# Patient Record
Sex: Female | Born: 1980
Health system: Southern US, Community
[De-identification: ages and names within clinical notes are randomized; demographics above are authoritative.]

## PROBLEM LIST (undated history)

## (undated) DIAGNOSIS — M87059 Idiopathic aseptic necrosis of unspecified femur: Secondary | ICD-10-CM

## (undated) DIAGNOSIS — I2510 Atherosclerotic heart disease of native coronary artery without angina pectoris: Secondary | ICD-10-CM

## (undated) DIAGNOSIS — IMO0002 Reserved for concepts with insufficient information to code with codable children: Secondary | ICD-10-CM

## (undated) DIAGNOSIS — M329 Systemic lupus erythematosus, unspecified: Secondary | ICD-10-CM

## (undated) DIAGNOSIS — R0602 Shortness of breath: Secondary | ICD-10-CM

## (undated) DIAGNOSIS — I10 Essential (primary) hypertension: Secondary | ICD-10-CM

## (undated) DIAGNOSIS — D649 Anemia, unspecified: Secondary | ICD-10-CM

## (undated) HISTORY — DX: Essential (primary) hypertension: I10

## (undated) HISTORY — DX: Atherosclerotic heart disease of native coronary artery without angina pectoris: I25.10

## (undated) HISTORY — DX: Reserved for concepts with insufficient information to code with codable children: IMO0002

## (undated) HISTORY — DX: Anemia, unspecified: D64.9

## (undated) HISTORY — DX: Idiopathic aseptic necrosis of unspecified femur: M87.059

## (undated) HISTORY — DX: Shortness of breath: R06.02

## (undated) HISTORY — DX: Systemic lupus erythematosus, unspecified: M32.9

---
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
## 2001-04-09 ENCOUNTER — Emergency Department (HOSPITAL_COMMUNITY): Admission: EM | Admit: 2001-04-09 | Discharge: 2001-04-09 | Payer: Self-pay | Admitting: Emergency Medicine

## 2001-05-01 ENCOUNTER — Emergency Department (HOSPITAL_COMMUNITY): Admission: EM | Admit: 2001-05-01 | Discharge: 2001-05-01 | Payer: Self-pay | Admitting: *Deleted

## 2001-05-02 ENCOUNTER — Emergency Department (HOSPITAL_COMMUNITY): Admission: EM | Admit: 2001-05-02 | Discharge: 2001-05-02 | Payer: Self-pay | Admitting: Emergency Medicine

## 2001-05-08 ENCOUNTER — Emergency Department (HOSPITAL_COMMUNITY): Admission: EM | Admit: 2001-05-08 | Discharge: 2001-05-08 | Payer: Self-pay | Admitting: Emergency Medicine

## 2001-05-23 ENCOUNTER — Encounter (INDEPENDENT_AMBULATORY_CARE_PROVIDER_SITE_OTHER): Payer: Self-pay | Admitting: Specialist

## 2001-05-23 ENCOUNTER — Inpatient Hospital Stay (HOSPITAL_COMMUNITY): Admission: EM | Admit: 2001-05-23 | Discharge: 2001-05-29 | Payer: Self-pay | Admitting: Family Medicine

## 2001-05-23 ENCOUNTER — Encounter: Payer: Self-pay | Admitting: Emergency Medicine

## 2001-05-23 ENCOUNTER — Emergency Department (HOSPITAL_COMMUNITY): Admission: EM | Admit: 2001-05-23 | Discharge: 2001-05-23 | Payer: Self-pay | Admitting: Emergency Medicine

## 2001-05-24 ENCOUNTER — Encounter: Payer: Self-pay | Admitting: Family Medicine

## 2001-05-25 ENCOUNTER — Encounter: Payer: Self-pay | Admitting: Family Medicine

## 2001-05-26 ENCOUNTER — Encounter: Payer: Self-pay | Admitting: Critical Care Medicine

## 2001-06-04 ENCOUNTER — Encounter: Admission: RE | Admit: 2001-06-04 | Discharge: 2001-06-04 | Payer: Self-pay | Admitting: Family Medicine

## 2001-06-22 ENCOUNTER — Encounter: Admission: RE | Admit: 2001-06-22 | Discharge: 2001-06-22 | Payer: Self-pay | Admitting: Family Medicine

## 2001-07-24 ENCOUNTER — Emergency Department (HOSPITAL_COMMUNITY): Admission: EM | Admit: 2001-07-24 | Discharge: 2001-07-24 | Payer: Self-pay | Admitting: Emergency Medicine

## 2001-08-19 ENCOUNTER — Encounter: Admission: RE | Admit: 2001-08-19 | Discharge: 2001-08-19 | Payer: Self-pay | Admitting: Family Medicine

## 2001-10-09 ENCOUNTER — Encounter: Payer: Self-pay | Admitting: Emergency Medicine

## 2001-10-09 ENCOUNTER — Emergency Department (HOSPITAL_COMMUNITY): Admission: EM | Admit: 2001-10-09 | Discharge: 2001-10-09 | Payer: Self-pay | Admitting: Emergency Medicine

## 2001-10-10 ENCOUNTER — Emergency Department (HOSPITAL_COMMUNITY): Admission: EM | Admit: 2001-10-10 | Discharge: 2001-10-10 | Payer: Self-pay | Admitting: Emergency Medicine

## 2001-10-13 ENCOUNTER — Emergency Department (HOSPITAL_COMMUNITY): Admission: EM | Admit: 2001-10-13 | Discharge: 2001-10-13 | Payer: Self-pay | Admitting: Emergency Medicine

## 2001-10-13 ENCOUNTER — Encounter: Payer: Self-pay | Admitting: Emergency Medicine

## 2001-10-16 ENCOUNTER — Encounter: Admission: RE | Admit: 2001-10-16 | Discharge: 2001-10-16 | Payer: Self-pay | Admitting: Family Medicine

## 2001-10-16 ENCOUNTER — Inpatient Hospital Stay (HOSPITAL_COMMUNITY): Admission: AD | Admit: 2001-10-16 | Discharge: 2001-10-19 | Payer: Self-pay | Admitting: Family Medicine

## 2001-10-16 ENCOUNTER — Encounter: Payer: Self-pay | Admitting: Family Medicine

## 2001-10-28 ENCOUNTER — Encounter: Admission: RE | Admit: 2001-10-28 | Discharge: 2001-10-28 | Payer: Self-pay | Admitting: Family Medicine

## 2001-12-01 ENCOUNTER — Encounter: Admission: RE | Admit: 2001-12-01 | Discharge: 2001-12-01 | Payer: Self-pay | Admitting: Family Medicine

## 2001-12-01 ENCOUNTER — Encounter: Payer: Self-pay | Admitting: Sports Medicine

## 2001-12-01 ENCOUNTER — Encounter: Admission: RE | Admit: 2001-12-01 | Discharge: 2001-12-01 | Payer: Self-pay | Admitting: Sports Medicine

## 2001-12-24 ENCOUNTER — Encounter: Admission: RE | Admit: 2001-12-24 | Discharge: 2001-12-24 | Payer: Self-pay | Admitting: Family Medicine

## 2002-01-18 ENCOUNTER — Ambulatory Visit (HOSPITAL_COMMUNITY): Admission: RE | Admit: 2002-01-18 | Discharge: 2002-01-18 | Payer: Self-pay | Admitting: Nephrology

## 2002-04-07 ENCOUNTER — Encounter: Admission: RE | Admit: 2002-04-07 | Discharge: 2002-04-07 | Payer: Self-pay | Admitting: Family Medicine

## 2002-07-08 ENCOUNTER — Emergency Department (HOSPITAL_COMMUNITY): Admission: EM | Admit: 2002-07-08 | Discharge: 2002-07-08 | Payer: Self-pay | Admitting: Emergency Medicine

## 2002-07-08 ENCOUNTER — Encounter: Payer: Self-pay | Admitting: Emergency Medicine

## 2002-07-13 ENCOUNTER — Encounter: Admission: RE | Admit: 2002-07-13 | Discharge: 2002-07-13 | Payer: Self-pay | Admitting: Family Medicine

## 2002-07-28 ENCOUNTER — Ambulatory Visit (HOSPITAL_COMMUNITY): Admission: RE | Admit: 2002-07-28 | Discharge: 2002-07-28 | Payer: Self-pay | Admitting: Family Medicine

## 2002-09-16 ENCOUNTER — Encounter: Admission: RE | Admit: 2002-09-16 | Discharge: 2002-09-16 | Payer: Self-pay | Admitting: Family Medicine

## 2002-10-28 ENCOUNTER — Emergency Department (HOSPITAL_COMMUNITY): Admission: EM | Admit: 2002-10-28 | Discharge: 2002-10-28 | Payer: Self-pay | Admitting: Emergency Medicine

## 2002-11-03 ENCOUNTER — Encounter: Admission: RE | Admit: 2002-11-03 | Discharge: 2002-11-03 | Payer: Self-pay | Admitting: Family Medicine

## 2002-11-30 ENCOUNTER — Encounter: Admission: RE | Admit: 2002-11-30 | Discharge: 2002-11-30 | Payer: Self-pay | Admitting: Sports Medicine

## 2003-01-11 ENCOUNTER — Emergency Department (HOSPITAL_COMMUNITY): Admission: EM | Admit: 2003-01-11 | Discharge: 2003-01-11 | Payer: Self-pay | Admitting: Emergency Medicine

## 2003-01-13 ENCOUNTER — Other Ambulatory Visit: Admission: RE | Admit: 2003-01-13 | Discharge: 2003-01-13 | Payer: Self-pay | Admitting: Family Medicine

## 2003-01-13 ENCOUNTER — Encounter (INDEPENDENT_AMBULATORY_CARE_PROVIDER_SITE_OTHER): Payer: Self-pay | Admitting: Specialist

## 2003-01-13 ENCOUNTER — Encounter: Admission: RE | Admit: 2003-01-13 | Discharge: 2003-01-13 | Payer: Self-pay | Admitting: Family Medicine

## 2003-02-14 ENCOUNTER — Encounter: Admission: RE | Admit: 2003-02-14 | Discharge: 2003-02-14 | Payer: Self-pay | Admitting: Sports Medicine

## 2003-03-22 ENCOUNTER — Emergency Department (HOSPITAL_COMMUNITY): Admission: EM | Admit: 2003-03-22 | Discharge: 2003-03-22 | Payer: Self-pay | Admitting: Emergency Medicine

## 2003-04-07 ENCOUNTER — Encounter: Admission: RE | Admit: 2003-04-07 | Discharge: 2003-04-07 | Payer: Self-pay | Admitting: Family Medicine

## 2003-04-08 ENCOUNTER — Encounter: Payer: Self-pay | Admitting: Family Medicine

## 2003-04-08 ENCOUNTER — Ambulatory Visit (HOSPITAL_COMMUNITY): Admission: RE | Admit: 2003-04-08 | Discharge: 2003-04-08 | Payer: Self-pay | Admitting: Family Medicine

## 2003-04-29 ENCOUNTER — Encounter: Admission: RE | Admit: 2003-04-29 | Discharge: 2003-04-29 | Payer: Self-pay | Admitting: Family Medicine

## 2003-05-11 ENCOUNTER — Emergency Department (HOSPITAL_COMMUNITY): Admission: EM | Admit: 2003-05-11 | Discharge: 2003-05-11 | Payer: Self-pay | Admitting: Emergency Medicine

## 2003-05-14 ENCOUNTER — Emergency Department (HOSPITAL_COMMUNITY): Admission: EM | Admit: 2003-05-14 | Discharge: 2003-05-14 | Payer: Self-pay | Admitting: Emergency Medicine

## 2003-05-26 ENCOUNTER — Emergency Department (HOSPITAL_COMMUNITY): Admission: EM | Admit: 2003-05-26 | Discharge: 2003-05-26 | Payer: Self-pay | Admitting: Emergency Medicine

## 2003-08-02 ENCOUNTER — Emergency Department (HOSPITAL_COMMUNITY): Admission: EM | Admit: 2003-08-02 | Discharge: 2003-08-02 | Payer: Self-pay | Admitting: Emergency Medicine

## 2003-08-25 ENCOUNTER — Encounter: Admission: RE | Admit: 2003-08-25 | Discharge: 2003-08-25 | Payer: Self-pay | Admitting: Family Medicine

## 2003-09-10 ENCOUNTER — Emergency Department (HOSPITAL_COMMUNITY): Admission: EM | Admit: 2003-09-10 | Discharge: 2003-09-10 | Payer: Self-pay

## 2003-10-31 ENCOUNTER — Encounter: Admission: RE | Admit: 2003-10-31 | Discharge: 2003-10-31 | Payer: Self-pay | Admitting: Sports Medicine

## 2003-12-20 ENCOUNTER — Encounter: Admission: RE | Admit: 2003-12-20 | Discharge: 2003-12-20 | Payer: Self-pay | Admitting: Family Medicine

## 2004-01-31 ENCOUNTER — Encounter: Admission: RE | Admit: 2004-01-31 | Discharge: 2004-01-31 | Payer: Self-pay | Admitting: Family Medicine

## 2004-02-11 ENCOUNTER — Emergency Department (HOSPITAL_COMMUNITY): Admission: EM | Admit: 2004-02-11 | Discharge: 2004-02-11 | Payer: Self-pay | Admitting: Emergency Medicine

## 2004-03-01 ENCOUNTER — Encounter (INDEPENDENT_AMBULATORY_CARE_PROVIDER_SITE_OTHER): Payer: Self-pay | Admitting: *Deleted

## 2004-03-01 ENCOUNTER — Other Ambulatory Visit: Admission: RE | Admit: 2004-03-01 | Discharge: 2004-03-01 | Payer: Self-pay | Admitting: Family Medicine

## 2004-03-01 ENCOUNTER — Ambulatory Visit: Payer: Self-pay | Admitting: Family Medicine

## 2004-03-28 ENCOUNTER — Ambulatory Visit: Payer: Self-pay | Admitting: Family Medicine

## 2004-05-08 ENCOUNTER — Ambulatory Visit: Payer: Self-pay | Admitting: Family Medicine

## 2004-06-07 ENCOUNTER — Ambulatory Visit: Payer: Self-pay | Admitting: Family Medicine

## 2004-06-12 ENCOUNTER — Ambulatory Visit: Payer: Self-pay | Admitting: Family Medicine

## 2004-07-03 ENCOUNTER — Ambulatory Visit: Payer: Self-pay | Admitting: Family Medicine

## 2004-07-24 ENCOUNTER — Ambulatory Visit: Payer: Self-pay | Admitting: Family Medicine

## 2004-09-09 ENCOUNTER — Emergency Department (HOSPITAL_COMMUNITY): Admission: EM | Admit: 2004-09-09 | Discharge: 2004-09-09 | Payer: Self-pay | Admitting: Emergency Medicine

## 2004-09-18 ENCOUNTER — Emergency Department (HOSPITAL_COMMUNITY): Admission: EM | Admit: 2004-09-18 | Discharge: 2004-09-18 | Payer: Self-pay | Admitting: *Deleted

## 2004-11-09 ENCOUNTER — Other Ambulatory Visit: Admission: RE | Admit: 2004-11-09 | Discharge: 2004-11-09 | Payer: Self-pay | Admitting: Family Medicine

## 2004-11-09 ENCOUNTER — Ambulatory Visit: Payer: Self-pay | Admitting: Family Medicine

## 2004-11-09 ENCOUNTER — Encounter (INDEPENDENT_AMBULATORY_CARE_PROVIDER_SITE_OTHER): Payer: Self-pay | Admitting: Specialist

## 2005-01-09 ENCOUNTER — Emergency Department (HOSPITAL_COMMUNITY): Admission: EM | Admit: 2005-01-09 | Discharge: 2005-01-09 | Payer: Self-pay | Admitting: Emergency Medicine

## 2005-01-22 ENCOUNTER — Ambulatory Visit: Payer: Self-pay | Admitting: Sports Medicine

## 2005-01-28 ENCOUNTER — Encounter: Admission: RE | Admit: 2005-01-28 | Discharge: 2005-01-28 | Payer: Self-pay | Admitting: Sports Medicine

## 2005-02-18 ENCOUNTER — Emergency Department (HOSPITAL_COMMUNITY): Admission: EM | Admit: 2005-02-18 | Discharge: 2005-02-18 | Payer: Self-pay | Admitting: Emergency Medicine

## 2005-02-21 ENCOUNTER — Ambulatory Visit: Payer: Self-pay | Admitting: Family Medicine

## 2005-03-12 ENCOUNTER — Ambulatory Visit: Payer: Self-pay | Admitting: Sports Medicine

## 2005-05-01 ENCOUNTER — Ambulatory Visit: Payer: Self-pay | Admitting: Family Medicine

## 2005-05-02 ENCOUNTER — Encounter: Admission: RE | Admit: 2005-05-02 | Discharge: 2005-05-02 | Payer: Self-pay | Admitting: Sports Medicine

## 2005-05-20 ENCOUNTER — Ambulatory Visit: Payer: Self-pay | Admitting: Family Medicine

## 2005-05-21 ENCOUNTER — Encounter: Admission: RE | Admit: 2005-05-21 | Discharge: 2005-05-21 | Payer: Self-pay | Admitting: Vascular Surgery

## 2005-06-20 ENCOUNTER — Ambulatory Visit: Payer: Self-pay | Admitting: Sports Medicine

## 2005-07-24 ENCOUNTER — Ambulatory Visit: Payer: Self-pay | Admitting: Family Medicine

## 2005-08-22 ENCOUNTER — Ambulatory Visit: Payer: Self-pay | Admitting: Family Medicine

## 2005-09-10 ENCOUNTER — Emergency Department (HOSPITAL_COMMUNITY): Admission: EM | Admit: 2005-09-10 | Discharge: 2005-09-10 | Payer: Self-pay | Admitting: Emergency Medicine

## 2005-10-31 ENCOUNTER — Emergency Department (HOSPITAL_COMMUNITY): Admission: EM | Admit: 2005-10-31 | Discharge: 2005-10-31 | Payer: Self-pay | Admitting: Emergency Medicine

## 2005-11-18 ENCOUNTER — Emergency Department (HOSPITAL_COMMUNITY): Admission: EM | Admit: 2005-11-18 | Discharge: 2005-11-18 | Payer: Self-pay | Admitting: Emergency Medicine

## 2005-12-02 ENCOUNTER — Ambulatory Visit: Payer: Self-pay | Admitting: Sports Medicine

## 2005-12-24 ENCOUNTER — Ambulatory Visit: Payer: Self-pay | Admitting: Sports Medicine

## 2006-01-16 ENCOUNTER — Ambulatory Visit: Payer: Self-pay | Admitting: Sports Medicine

## 2006-01-28 ENCOUNTER — Encounter (INDEPENDENT_AMBULATORY_CARE_PROVIDER_SITE_OTHER): Payer: Self-pay | Admitting: *Deleted

## 2006-01-28 LAB — CONVERTED CEMR LAB

## 2006-02-18 ENCOUNTER — Ambulatory Visit: Payer: Self-pay | Admitting: Sports Medicine

## 2006-02-18 ENCOUNTER — Other Ambulatory Visit: Admission: RE | Admit: 2006-02-18 | Discharge: 2006-02-18 | Payer: Self-pay | Admitting: Family Medicine

## 2006-02-18 ENCOUNTER — Encounter (INDEPENDENT_AMBULATORY_CARE_PROVIDER_SITE_OTHER): Payer: Self-pay | Admitting: *Deleted

## 2006-03-11 ENCOUNTER — Ambulatory Visit: Payer: Self-pay | Admitting: Sports Medicine

## 2006-03-17 ENCOUNTER — Ambulatory Visit: Payer: Self-pay | Admitting: Sports Medicine

## 2006-03-20 ENCOUNTER — Ambulatory Visit: Payer: Self-pay | Admitting: Sports Medicine

## 2006-04-14 ENCOUNTER — Ambulatory Visit: Payer: Self-pay | Admitting: Family Medicine

## 2006-04-22 ENCOUNTER — Ambulatory Visit: Payer: Self-pay | Admitting: Family Medicine

## 2006-05-13 ENCOUNTER — Ambulatory Visit: Payer: Self-pay | Admitting: Family Medicine

## 2006-06-05 ENCOUNTER — Ambulatory Visit: Payer: Self-pay | Admitting: Family Medicine

## 2006-06-25 ENCOUNTER — Ambulatory Visit: Payer: Self-pay | Admitting: Family Medicine

## 2006-06-25 ENCOUNTER — Encounter: Payer: Self-pay | Admitting: Family Medicine

## 2006-06-25 LAB — CONVERTED CEMR LAB
Prolactin: 21.8 ng/mL
TSH: 0.451 microintl units/mL (ref 0.350–5.50)

## 2006-07-06 ENCOUNTER — Emergency Department (HOSPITAL_COMMUNITY): Admission: EM | Admit: 2006-07-06 | Discharge: 2006-07-06 | Payer: Self-pay | Admitting: Emergency Medicine

## 2006-07-22 ENCOUNTER — Encounter (INDEPENDENT_AMBULATORY_CARE_PROVIDER_SITE_OTHER): Payer: Self-pay | Admitting: Family Medicine

## 2006-07-22 ENCOUNTER — Ambulatory Visit: Payer: Self-pay | Admitting: Family Medicine

## 2006-07-22 LAB — CONVERTED CEMR LAB
Free T4: 1.06 ng/dL (ref 0.89–1.80)
T3, Free: 3.4 pg/mL (ref 2.3–4.2)
TSH: 0.619 microintl units/mL (ref 0.350–5.50)

## 2006-07-23 ENCOUNTER — Encounter (INDEPENDENT_AMBULATORY_CARE_PROVIDER_SITE_OTHER): Payer: Self-pay | Admitting: Family Medicine

## 2006-07-23 LAB — CONVERTED CEMR LAB: Prolactin: 19.2 ng/mL

## 2006-07-24 ENCOUNTER — Encounter (INDEPENDENT_AMBULATORY_CARE_PROVIDER_SITE_OTHER): Payer: Self-pay | Admitting: Family Medicine

## 2006-07-24 LAB — CONVERTED CEMR LAB
ALT: 9 units/L (ref 0–35)
AST: 14 units/L (ref 0–37)
Albumin: 3.3 g/dL — ABNORMAL LOW (ref 3.5–5.2)
Alkaline Phosphatase: 42 units/L (ref 39–117)
BUN: 12 mg/dL (ref 6–23)
CO2: 24 meq/L (ref 19–32)
Calcium: 7.7 mg/dL — ABNORMAL LOW (ref 8.4–10.5)
Chloride: 110 meq/L (ref 96–112)
Collection Interval-CRCL: 24 hr
Creatinine 24 HR UR: 2091 mg/24hr — ABNORMAL HIGH (ref 700–1800)
Creatinine Clearance: 167 mL/min — ABNORMAL HIGH (ref 75–115)
Creatinine, Ser: 0.87 mg/dL (ref 0.40–1.20)
Creatinine, Urine: 139.4 mg/dL
Glucose, Bld: 79 mg/dL (ref 70–99)
LDH: 171 units/L (ref 94–250)
Potassium: 3.8 meq/L (ref 3.5–5.3)
Protein, Ur: 90 mg/24hr (ref 50–100)
Sodium: 141 meq/L (ref 135–145)
Total Bilirubin: 0.3 mg/dL (ref 0.3–1.2)
Total Protein: 5.9 g/dL — ABNORMAL LOW (ref 6.0–8.3)
Uric Acid, Serum: 6.1 mg/dL (ref 2.4–7.0)

## 2006-08-21 DIAGNOSIS — I10 Essential (primary) hypertension: Secondary | ICD-10-CM | POA: Insufficient documentation

## 2006-08-21 DIAGNOSIS — D649 Anemia, unspecified: Secondary | ICD-10-CM | POA: Insufficient documentation

## 2006-08-21 DIAGNOSIS — M329 Systemic lupus erythematosus, unspecified: Secondary | ICD-10-CM | POA: Insufficient documentation

## 2006-08-21 DIAGNOSIS — R809 Proteinuria, unspecified: Secondary | ICD-10-CM | POA: Insufficient documentation

## 2006-08-22 ENCOUNTER — Encounter (INDEPENDENT_AMBULATORY_CARE_PROVIDER_SITE_OTHER): Payer: Self-pay | Admitting: *Deleted

## 2006-08-27 ENCOUNTER — Telehealth (INDEPENDENT_AMBULATORY_CARE_PROVIDER_SITE_OTHER): Payer: Self-pay | Admitting: Family Medicine

## 2006-08-28 ENCOUNTER — Ambulatory Visit: Payer: Self-pay | Admitting: Family Medicine

## 2006-08-28 DIAGNOSIS — M87051 Idiopathic aseptic necrosis of right femur: Secondary | ICD-10-CM | POA: Insufficient documentation

## 2006-09-08 ENCOUNTER — Telehealth (INDEPENDENT_AMBULATORY_CARE_PROVIDER_SITE_OTHER): Payer: Self-pay | Admitting: Family Medicine

## 2006-09-10 ENCOUNTER — Telehealth: Payer: Self-pay | Admitting: *Deleted

## 2006-09-10 ENCOUNTER — Telehealth (INDEPENDENT_AMBULATORY_CARE_PROVIDER_SITE_OTHER): Payer: Self-pay | Admitting: *Deleted

## 2006-09-11 ENCOUNTER — Emergency Department (HOSPITAL_COMMUNITY): Admission: EM | Admit: 2006-09-11 | Discharge: 2006-09-11 | Payer: Self-pay | Admitting: Emergency Medicine

## 2006-09-11 ENCOUNTER — Telehealth: Payer: Self-pay | Admitting: *Deleted

## 2006-09-23 ENCOUNTER — Telehealth: Payer: Self-pay | Admitting: *Deleted

## 2006-10-01 ENCOUNTER — Telehealth (INDEPENDENT_AMBULATORY_CARE_PROVIDER_SITE_OTHER): Payer: Self-pay | Admitting: *Deleted

## 2006-10-03 ENCOUNTER — Ambulatory Visit: Payer: Self-pay | Admitting: Family Medicine

## 2006-11-04 ENCOUNTER — Encounter: Payer: Self-pay | Admitting: Family Medicine

## 2006-11-04 ENCOUNTER — Ambulatory Visit: Payer: Self-pay | Admitting: Family Medicine

## 2006-11-04 ENCOUNTER — Encounter (INDEPENDENT_AMBULATORY_CARE_PROVIDER_SITE_OTHER): Payer: Self-pay | Admitting: Family Medicine

## 2006-11-04 ENCOUNTER — Other Ambulatory Visit: Admission: RE | Admit: 2006-11-04 | Discharge: 2006-11-04 | Payer: Self-pay | Admitting: Family Medicine

## 2006-11-04 LAB — CONVERTED CEMR LAB
BUN: 10 mg/dL (ref 6–23)
CO2: 25 meq/L (ref 19–32)
Calcium: 8.8 mg/dL (ref 8.4–10.5)
Chloride: 106 meq/L (ref 96–112)
Creatinine, Ser: 0.99 mg/dL (ref 0.40–1.20)
Glucose, Bld: 96 mg/dL (ref 70–99)
Potassium: 3.7 meq/L (ref 3.5–5.3)
Sodium: 141 meq/L (ref 135–145)

## 2006-11-27 ENCOUNTER — Telehealth: Payer: Self-pay | Admitting: *Deleted

## 2006-12-11 ENCOUNTER — Ambulatory Visit: Payer: Self-pay | Admitting: Sports Medicine

## 2006-12-11 ENCOUNTER — Encounter (INDEPENDENT_AMBULATORY_CARE_PROVIDER_SITE_OTHER): Payer: Self-pay | Admitting: Family Medicine

## 2006-12-11 LAB — CONVERTED CEMR LAB
Basophils Absolute: <0.2 10*3/uL
Eosinophils Absolute: <0.7 10*3/uL
Ferritin: 24 ng/mL (ref 10–291)
Granulocyte count absolute: 4.8 10*3/uL
Granulocyte percent: 68.6 %
HCT: 31.4 %
Hemoglobin: 10.6 g/dL
Lymphocytes Relative: 21.6 %
Lymphs Abs: 1.5 10*3/uL
MCV: 89.1 fL
Monocytes Absolute: 0.7 10*3/uL
Monocytes Relative: 9.8 %
Platelets: 345 10*3/uL
RBC: 3.53 M/uL
WBC: 7 10*3/uL

## 2007-01-05 ENCOUNTER — Telehealth (INDEPENDENT_AMBULATORY_CARE_PROVIDER_SITE_OTHER): Payer: Self-pay | Admitting: Family Medicine

## 2007-01-08 ENCOUNTER — Telehealth (INDEPENDENT_AMBULATORY_CARE_PROVIDER_SITE_OTHER): Payer: Self-pay | Admitting: Family Medicine

## 2007-01-09 ENCOUNTER — Telehealth (INDEPENDENT_AMBULATORY_CARE_PROVIDER_SITE_OTHER): Payer: Self-pay | Admitting: Family Medicine

## 2007-02-02 ENCOUNTER — Telehealth: Payer: Self-pay | Admitting: *Deleted

## 2007-02-09 ENCOUNTER — Telehealth: Payer: Self-pay | Admitting: *Deleted

## 2007-02-09 ENCOUNTER — Ambulatory Visit: Payer: Self-pay | Admitting: Family Medicine

## 2007-02-09 LAB — CONVERTED CEMR LAB: Beta hcg, urine, semiquantitative: NEGATIVE

## 2007-02-10 ENCOUNTER — Telehealth: Payer: Self-pay | Admitting: *Deleted

## 2007-02-18 ENCOUNTER — Encounter (INDEPENDENT_AMBULATORY_CARE_PROVIDER_SITE_OTHER): Payer: Self-pay | Admitting: *Deleted

## 2007-02-19 ENCOUNTER — Telehealth (INDEPENDENT_AMBULATORY_CARE_PROVIDER_SITE_OTHER): Payer: Self-pay | Admitting: Family Medicine

## 2007-03-05 ENCOUNTER — Ambulatory Visit: Payer: Self-pay | Admitting: Family Medicine

## 2007-03-05 ENCOUNTER — Encounter (INDEPENDENT_AMBULATORY_CARE_PROVIDER_SITE_OTHER): Payer: Self-pay | Admitting: Family Medicine

## 2007-03-05 LAB — CONVERTED CEMR LAB
BUN: 15 mg/dL (ref 6–23)
CO2: 24 meq/L (ref 19–32)
Calcium: 9 mg/dL (ref 8.4–10.5)
Chloride: 109 meq/L (ref 96–112)
Creatinine, Ser: 1.04 mg/dL (ref 0.40–1.20)
Glucose, Bld: 85 mg/dL (ref 70–99)
Potassium: 3.7 meq/L (ref 3.5–5.3)
Sodium: 142 meq/L (ref 135–145)

## 2007-03-08 ENCOUNTER — Encounter (INDEPENDENT_AMBULATORY_CARE_PROVIDER_SITE_OTHER): Payer: Self-pay | Admitting: Family Medicine

## 2007-04-06 ENCOUNTER — Ambulatory Visit: Payer: Self-pay | Admitting: Family Medicine

## 2007-04-06 ENCOUNTER — Encounter (INDEPENDENT_AMBULATORY_CARE_PROVIDER_SITE_OTHER): Payer: Self-pay | Admitting: Family Medicine

## 2007-04-06 LAB — CONVERTED CEMR LAB
ALT: <8 units/L (ref 0–35)
AST: 13 units/L (ref 0–37)
Albumin: 3.8 g/dL (ref 3.5–5.2)
Alkaline Phosphatase: 42 units/L (ref 39–117)
BUN: 13 mg/dL (ref 6–23)
CO2: 23 meq/L (ref 19–32)
Calcium: 8.4 mg/dL (ref 8.4–10.5)
Chloride: 107 meq/L (ref 96–112)
Creatinine, Ser: 0.93 mg/dL (ref 0.40–1.20)
Glucose, Bld: 87 mg/dL (ref 70–99)
HCT: 34.6 % — ABNORMAL LOW (ref 36.0–46.0)
Hemoglobin: 11.5 g/dL — ABNORMAL LOW (ref 12.0–15.0)
MCHC: 33.2 g/dL (ref 30.0–36.0)
MCV: 90.1 fL (ref 78.0–100.0)
Platelets: 327 10*3/uL (ref 150–400)
Potassium: 3.6 meq/L (ref 3.5–5.3)
RBC: 3.84 M/uL — ABNORMAL LOW (ref 3.87–5.11)
RDW: 13.8 % (ref 11.5–14.0)
Sodium: 141 meq/L (ref 135–145)
Total Bilirubin: 0.3 mg/dL (ref 0.3–1.2)
Total Protein: 6.7 g/dL (ref 6.0–8.3)
WBC: 6.1 10*3/uL (ref 4.0–10.5)

## 2007-04-07 ENCOUNTER — Encounter (INDEPENDENT_AMBULATORY_CARE_PROVIDER_SITE_OTHER): Payer: Self-pay | Admitting: Family Medicine

## 2007-04-28 ENCOUNTER — Ambulatory Visit: Payer: Self-pay | Admitting: Family Medicine

## 2007-05-07 ENCOUNTER — Ambulatory Visit: Payer: Self-pay | Admitting: Family Medicine

## 2007-05-08 ENCOUNTER — Telehealth: Payer: Self-pay | Admitting: *Deleted

## 2007-05-13 ENCOUNTER — Telehealth: Payer: Self-pay | Admitting: *Deleted

## 2007-05-14 ENCOUNTER — Telehealth (INDEPENDENT_AMBULATORY_CARE_PROVIDER_SITE_OTHER): Payer: Self-pay | Admitting: Family Medicine

## 2007-05-26 ENCOUNTER — Encounter (INDEPENDENT_AMBULATORY_CARE_PROVIDER_SITE_OTHER): Payer: Self-pay | Admitting: Family Medicine

## 2007-06-02 ENCOUNTER — Ambulatory Visit: Payer: Self-pay | Admitting: Family Medicine

## 2007-06-02 ENCOUNTER — Encounter (INDEPENDENT_AMBULATORY_CARE_PROVIDER_SITE_OTHER): Payer: Self-pay | Admitting: Family Medicine

## 2007-06-03 LAB — CONVERTED CEMR LAB

## 2007-06-29 ENCOUNTER — Other Ambulatory Visit: Admission: RE | Admit: 2007-06-29 | Discharge: 2007-06-29 | Payer: Self-pay | Admitting: Family Medicine

## 2007-06-29 ENCOUNTER — Ambulatory Visit: Payer: Self-pay | Admitting: Family Medicine

## 2007-06-29 ENCOUNTER — Encounter (INDEPENDENT_AMBULATORY_CARE_PROVIDER_SITE_OTHER): Payer: Self-pay | Admitting: Family Medicine

## 2007-06-29 ENCOUNTER — Telehealth: Payer: Self-pay | Admitting: *Deleted

## 2007-06-29 LAB — CONVERTED CEMR LAB: Beta hcg, urine, semiquantitative: NEGATIVE

## 2007-06-30 ENCOUNTER — Encounter: Admission: RE | Admit: 2007-06-30 | Discharge: 2007-06-30 | Payer: Self-pay | Admitting: Family Medicine

## 2007-07-03 LAB — CONVERTED CEMR LAB

## 2007-07-07 ENCOUNTER — Telehealth (INDEPENDENT_AMBULATORY_CARE_PROVIDER_SITE_OTHER): Payer: Self-pay | Admitting: Family Medicine

## 2007-07-13 ENCOUNTER — Telehealth (INDEPENDENT_AMBULATORY_CARE_PROVIDER_SITE_OTHER): Payer: Self-pay | Admitting: Family Medicine

## 2007-07-14 ENCOUNTER — Inpatient Hospital Stay (HOSPITAL_COMMUNITY): Admission: AD | Admit: 2007-07-14 | Discharge: 2007-07-14 | Payer: Self-pay | Admitting: Obstetrics & Gynecology

## 2007-07-28 ENCOUNTER — Telehealth: Payer: Self-pay | Admitting: *Deleted

## 2007-08-10 ENCOUNTER — Ambulatory Visit: Payer: Self-pay | Admitting: Family Medicine

## 2007-08-10 LAB — CONVERTED CEMR LAB: Beta hcg, urine, semiquantitative: POSITIVE

## 2007-08-11 ENCOUNTER — Telehealth (INDEPENDENT_AMBULATORY_CARE_PROVIDER_SITE_OTHER): Payer: Self-pay | Admitting: Family Medicine

## 2007-08-12 ENCOUNTER — Inpatient Hospital Stay (HOSPITAL_COMMUNITY): Admission: AD | Admit: 2007-08-12 | Discharge: 2007-08-13 | Payer: Self-pay | Admitting: Obstetrics & Gynecology

## 2007-08-13 ENCOUNTER — Encounter (INDEPENDENT_AMBULATORY_CARE_PROVIDER_SITE_OTHER): Payer: Self-pay | Admitting: *Deleted

## 2007-08-15 ENCOUNTER — Inpatient Hospital Stay (HOSPITAL_COMMUNITY): Admission: AD | Admit: 2007-08-15 | Discharge: 2007-08-15 | Payer: Self-pay | Admitting: Obstetrics & Gynecology

## 2007-08-20 ENCOUNTER — Ambulatory Visit: Payer: Self-pay | Admitting: Obstetrics & Gynecology

## 2007-08-21 ENCOUNTER — Ambulatory Visit (HOSPITAL_COMMUNITY): Admission: RE | Admit: 2007-08-21 | Discharge: 2007-08-21 | Payer: Self-pay | Admitting: Obstetrics & Gynecology

## 2007-08-27 ENCOUNTER — Ambulatory Visit: Payer: Self-pay | Admitting: Family Medicine

## 2007-09-03 ENCOUNTER — Ambulatory Visit (HOSPITAL_COMMUNITY): Admission: RE | Admit: 2007-09-03 | Discharge: 2007-09-03 | Payer: Self-pay | Admitting: Family Medicine

## 2007-09-08 ENCOUNTER — Telehealth: Payer: Self-pay | Admitting: *Deleted

## 2007-09-10 ENCOUNTER — Other Ambulatory Visit: Payer: Self-pay | Admitting: Obstetrics and Gynecology

## 2007-09-10 ENCOUNTER — Inpatient Hospital Stay (HOSPITAL_COMMUNITY): Admission: RE | Admit: 2007-09-10 | Discharge: 2007-09-10 | Payer: Self-pay | Admitting: Obstetrics and Gynecology

## 2007-09-11 ENCOUNTER — Ambulatory Visit: Payer: Self-pay | Admitting: Obstetrics and Gynecology

## 2007-09-11 ENCOUNTER — Observation Stay (HOSPITAL_COMMUNITY): Admission: AD | Admit: 2007-09-11 | Discharge: 2007-09-11 | Payer: Self-pay | Admitting: Gynecology

## 2007-09-11 ENCOUNTER — Encounter: Payer: Self-pay | Admitting: Obstetrics and Gynecology

## 2007-09-23 ENCOUNTER — Ambulatory Visit: Payer: Self-pay | Admitting: Family Medicine

## 2007-09-25 ENCOUNTER — Ambulatory Visit: Payer: Self-pay | Admitting: Obstetrics & Gynecology

## 2007-09-25 ENCOUNTER — Encounter (INDEPENDENT_AMBULATORY_CARE_PROVIDER_SITE_OTHER): Payer: Self-pay | Admitting: Family Medicine

## 2007-09-25 ENCOUNTER — Ambulatory Visit: Payer: Self-pay | Admitting: Family Medicine

## 2007-09-25 LAB — CONVERTED CEMR LAB
BUN: 12 mg/dL (ref 6–23)
CO2: 23 meq/L (ref 19–32)
Calcium: 8.4 mg/dL (ref 8.4–10.5)
Chloride: 104 meq/L (ref 96–112)
Cholesterol: 172 mg/dL (ref 0–200)
Creatinine, Ser: 1.01 mg/dL (ref 0.40–1.20)
Glucose, Bld: 86 mg/dL (ref 70–99)
HDL: 49 mg/dL (ref >39)
LDL Cholesterol: 107 mg/dL — ABNORMAL HIGH (ref 0–99)
Potassium: 3.5 meq/L (ref 3.5–5.3)
Sodium: 139 meq/L (ref 135–145)
Total CHOL/HDL Ratio: 3.5
Triglycerides: 79 mg/dL (ref <150)
VLDL: 16 mg/dL (ref 0–40)

## 2007-09-28 ENCOUNTER — Encounter (INDEPENDENT_AMBULATORY_CARE_PROVIDER_SITE_OTHER): Payer: Self-pay | Admitting: Family Medicine

## 2007-10-09 ENCOUNTER — Telehealth (INDEPENDENT_AMBULATORY_CARE_PROVIDER_SITE_OTHER): Payer: Self-pay | Admitting: Family Medicine

## 2007-11-06 ENCOUNTER — Ambulatory Visit: Payer: Self-pay | Admitting: Family Medicine

## 2007-11-06 ENCOUNTER — Telehealth: Payer: Self-pay | Admitting: *Deleted

## 2007-11-06 ENCOUNTER — Encounter: Payer: Self-pay | Admitting: Family Medicine

## 2007-11-09 ENCOUNTER — Telehealth (INDEPENDENT_AMBULATORY_CARE_PROVIDER_SITE_OTHER): Payer: Self-pay | Admitting: Family Medicine

## 2007-12-09 ENCOUNTER — Telehealth (INDEPENDENT_AMBULATORY_CARE_PROVIDER_SITE_OTHER): Payer: Self-pay | Admitting: *Deleted

## 2007-12-17 ENCOUNTER — Encounter (INDEPENDENT_AMBULATORY_CARE_PROVIDER_SITE_OTHER): Payer: Self-pay | Admitting: Family Medicine

## 2007-12-28 ENCOUNTER — Encounter: Admission: RE | Admit: 2007-12-28 | Discharge: 2007-12-28 | Payer: Self-pay | Admitting: Family Medicine

## 2008-01-07 ENCOUNTER — Telehealth: Payer: Self-pay | Admitting: *Deleted

## 2008-01-28 ENCOUNTER — Ambulatory Visit: Payer: Self-pay | Admitting: Family Medicine

## 2008-01-28 ENCOUNTER — Encounter (INDEPENDENT_AMBULATORY_CARE_PROVIDER_SITE_OTHER): Payer: Self-pay | Admitting: Family Medicine

## 2008-01-28 LAB — CONVERTED CEMR LAB
BUN: 18 mg/dL (ref 6–23)
CO2: 21 meq/L (ref 19–32)
Calcium: 8.8 mg/dL (ref 8.4–10.5)
Chloride: 107 meq/L (ref 96–112)
Creatinine, Ser: 1.04 mg/dL (ref 0.40–1.20)
Glucose, Bld: 96 mg/dL (ref 70–99)
Potassium: 3.7 meq/L (ref 3.5–5.3)
Sodium: 139 meq/L (ref 135–145)

## 2008-02-05 ENCOUNTER — Telehealth (INDEPENDENT_AMBULATORY_CARE_PROVIDER_SITE_OTHER): Payer: Self-pay | Admitting: Family Medicine

## 2008-02-29 ENCOUNTER — Inpatient Hospital Stay (HOSPITAL_COMMUNITY): Admission: AD | Admit: 2008-02-29 | Discharge: 2008-02-29 | Payer: Self-pay | Admitting: Obstetrics & Gynecology

## 2008-03-01 ENCOUNTER — Other Ambulatory Visit: Admission: RE | Admit: 2008-03-01 | Discharge: 2008-03-01 | Payer: Self-pay | Admitting: Family Medicine

## 2008-03-01 ENCOUNTER — Encounter (INDEPENDENT_AMBULATORY_CARE_PROVIDER_SITE_OTHER): Payer: Self-pay | Admitting: Family Medicine

## 2008-03-01 ENCOUNTER — Ambulatory Visit: Payer: Self-pay | Admitting: Family Medicine

## 2008-03-01 LAB — CONVERTED CEMR LAB
Antibody Screen: NEGATIVE
Basophils Absolute: 0 10*3/uL (ref 0.0–0.1)
Basophils Relative: 0 % (ref 0–1)
Beta hcg, urine, semiquantitative: POSITIVE
Chlamydia, DNA Probe: NEGATIVE
Eosinophils Absolute: 0 10*3/uL (ref 0.0–0.7)
Eosinophils Relative: 0 % (ref 0–5)
GC Probe Amp, Genital: NEGATIVE
HCT: 31.7 % — ABNORMAL LOW (ref 36.0–46.0)
Hemoglobin: 10.4 g/dL — ABNORMAL LOW (ref 12.0–15.0)
Hepatitis B Surface Ag: NEGATIVE
Lymphocytes Relative: 8 % — ABNORMAL LOW (ref 12–46)
Lymphs Abs: 0.8 10*3/uL (ref 0.7–4.0)
MCHC: 32.8 g/dL (ref 30.0–36.0)
MCV: 86.8 fL (ref 78.0–100.0)
Monocytes Absolute: 0.5 10*3/uL (ref 0.1–1.0)
Monocytes Relative: 5 % (ref 3–12)
Neutro Abs: 8.7 10*3/uL — ABNORMAL HIGH (ref 1.7–7.7)
Neutrophils Relative %: 86 % — ABNORMAL HIGH (ref 43–77)
Platelets: 358 10*3/uL (ref 150–400)
RBC: 3.65 M/uL — ABNORMAL LOW (ref 3.87–5.11)
RDW: 13.5 % (ref 11.5–15.5)
Rh Type: POSITIVE
Rubella: 231.6 intl units/mL — ABNORMAL HIGH
Sickle Cell Screen: NEGATIVE
WBC: 10.1 10*3/uL (ref 4.0–10.5)

## 2008-03-02 ENCOUNTER — Encounter (INDEPENDENT_AMBULATORY_CARE_PROVIDER_SITE_OTHER): Payer: Self-pay | Admitting: *Deleted

## 2008-03-07 ENCOUNTER — Telehealth (INDEPENDENT_AMBULATORY_CARE_PROVIDER_SITE_OTHER): Payer: Self-pay | Admitting: Family Medicine

## 2008-03-09 ENCOUNTER — Ambulatory Visit: Payer: Self-pay | Admitting: Family Medicine

## 2008-03-10 ENCOUNTER — Ambulatory Visit (HOSPITAL_COMMUNITY): Admission: RE | Admit: 2008-03-10 | Discharge: 2008-03-10 | Payer: Self-pay | Admitting: Family Medicine

## 2008-03-10 ENCOUNTER — Encounter (INDEPENDENT_AMBULATORY_CARE_PROVIDER_SITE_OTHER): Payer: Self-pay | Admitting: Family Medicine

## 2008-03-11 ENCOUNTER — Encounter: Payer: Self-pay | Admitting: *Deleted

## 2008-03-16 ENCOUNTER — Inpatient Hospital Stay (HOSPITAL_COMMUNITY): Admission: AD | Admit: 2008-03-16 | Discharge: 2008-03-16 | Payer: Self-pay | Admitting: Obstetrics & Gynecology

## 2008-03-24 ENCOUNTER — Ambulatory Visit: Payer: Self-pay | Admitting: Obstetrics & Gynecology

## 2008-04-06 ENCOUNTER — Telehealth (INDEPENDENT_AMBULATORY_CARE_PROVIDER_SITE_OTHER): Payer: Self-pay | Admitting: Family Medicine

## 2008-04-07 ENCOUNTER — Encounter (INDEPENDENT_AMBULATORY_CARE_PROVIDER_SITE_OTHER): Payer: Self-pay | Admitting: Family Medicine

## 2008-04-07 ENCOUNTER — Ambulatory Visit: Payer: Self-pay | Admitting: Obstetrics & Gynecology

## 2008-04-07 LAB — CONVERTED CEMR LAB
ALT: 13 units/L (ref 0–35)
AST: 16 units/L (ref 0–37)
Albumin: 3.4 g/dL — ABNORMAL LOW (ref 3.5–5.2)
Alkaline Phosphatase: 41 units/L (ref 39–117)
BUN: 10 mg/dL (ref 6–23)
CO2: 25 meq/L (ref 19–32)
Calcium: 9.6 mg/dL (ref 8.4–10.5)
Chloride: 103 meq/L (ref 96–112)
Creatinine, Ser: 0.86 mg/dL (ref 0.40–1.20)
Glucose, Bld: 85 mg/dL (ref 70–99)
Potassium: 3.7 meq/L (ref 3.5–5.3)
Sodium: 135 meq/L (ref 135–145)
Total Bilirubin: 0.3 mg/dL (ref 0.3–1.2)
Total Protein: 6.2 g/dL (ref 6.0–8.3)
Uric Acid, Serum: 5 mg/dL (ref 2.4–7.0)

## 2008-04-11 ENCOUNTER — Encounter: Payer: Self-pay | Admitting: Obstetrics & Gynecology

## 2008-04-11 ENCOUNTER — Ambulatory Visit: Payer: Self-pay | Admitting: Obstetrics & Gynecology

## 2008-04-11 LAB — CONVERTED CEMR LAB
Collection Interval-CRCL: 24 hr
Creatinine 24 HR UR: 2088 mg/24hr — ABNORMAL HIGH (ref 700–1800)
Creatinine Clearance: 169 mL/min — ABNORMAL HIGH (ref 75–115)
Creatinine, Urine: 185.6 mg/dL
Protein, Ur: 158 mg/24hr — ABNORMAL HIGH (ref 50–100)

## 2008-04-13 ENCOUNTER — Ambulatory Visit (HOSPITAL_COMMUNITY): Admission: RE | Admit: 2008-04-13 | Discharge: 2008-04-13 | Payer: Self-pay | Admitting: Obstetrics & Gynecology

## 2008-04-18 ENCOUNTER — Telehealth (INDEPENDENT_AMBULATORY_CARE_PROVIDER_SITE_OTHER): Payer: Self-pay | Admitting: Family Medicine

## 2008-04-20 ENCOUNTER — Inpatient Hospital Stay (HOSPITAL_COMMUNITY): Admission: AD | Admit: 2008-04-20 | Discharge: 2008-04-20 | Payer: Self-pay | Admitting: Obstetrics & Gynecology

## 2008-04-21 ENCOUNTER — Ambulatory Visit: Payer: Self-pay | Admitting: Obstetrics & Gynecology

## 2008-05-05 ENCOUNTER — Telehealth (INDEPENDENT_AMBULATORY_CARE_PROVIDER_SITE_OTHER): Payer: Self-pay | Admitting: *Deleted

## 2008-05-05 ENCOUNTER — Ambulatory Visit: Payer: Self-pay | Admitting: Family Medicine

## 2008-05-05 ENCOUNTER — Encounter: Payer: Self-pay | Admitting: Obstetrics & Gynecology

## 2008-05-11 ENCOUNTER — Ambulatory Visit (HOSPITAL_COMMUNITY): Admission: RE | Admit: 2008-05-11 | Discharge: 2008-05-11 | Payer: Self-pay | Admitting: Obstetrics & Gynecology

## 2008-05-12 ENCOUNTER — Ambulatory Visit: Payer: Self-pay | Admitting: Obstetrics & Gynecology

## 2008-05-13 ENCOUNTER — Encounter: Payer: Self-pay | Admitting: Obstetrics & Gynecology

## 2008-05-19 ENCOUNTER — Inpatient Hospital Stay (HOSPITAL_COMMUNITY): Admission: AD | Admit: 2008-05-19 | Discharge: 2008-05-19 | Payer: Self-pay | Admitting: Obstetrics & Gynecology

## 2008-05-25 ENCOUNTER — Ambulatory Visit (HOSPITAL_COMMUNITY): Admission: RE | Admit: 2008-05-25 | Discharge: 2008-05-25 | Payer: Self-pay | Admitting: Obstetrics and Gynecology

## 2008-05-30 ENCOUNTER — Telehealth (INDEPENDENT_AMBULATORY_CARE_PROVIDER_SITE_OTHER): Payer: Self-pay | Admitting: Family Medicine

## 2008-06-02 ENCOUNTER — Ambulatory Visit: Payer: Self-pay | Admitting: Obstetrics & Gynecology

## 2008-06-02 ENCOUNTER — Telehealth (INDEPENDENT_AMBULATORY_CARE_PROVIDER_SITE_OTHER): Payer: Self-pay | Admitting: *Deleted

## 2008-06-13 ENCOUNTER — Inpatient Hospital Stay (HOSPITAL_COMMUNITY): Admission: AD | Admit: 2008-06-13 | Discharge: 2008-06-13 | Payer: Self-pay | Admitting: Obstetrics & Gynecology

## 2008-06-23 ENCOUNTER — Ambulatory Visit: Payer: Self-pay | Admitting: Family Medicine

## 2008-06-23 ENCOUNTER — Encounter: Payer: Self-pay | Admitting: Obstetrics & Gynecology

## 2008-06-29 ENCOUNTER — Encounter: Admission: RE | Admit: 2008-06-29 | Discharge: 2008-06-29 | Payer: Self-pay | Admitting: Family Medicine

## 2008-06-30 ENCOUNTER — Ambulatory Visit (HOSPITAL_COMMUNITY): Admission: RE | Admit: 2008-06-30 | Discharge: 2008-06-30 | Payer: Self-pay | Admitting: Family Medicine

## 2008-07-10 ENCOUNTER — Ambulatory Visit: Payer: Self-pay | Admitting: Obstetrics and Gynecology

## 2008-07-10 ENCOUNTER — Inpatient Hospital Stay (HOSPITAL_COMMUNITY): Admission: AD | Admit: 2008-07-10 | Discharge: 2008-07-10 | Payer: Self-pay | Admitting: Obstetrics & Gynecology

## 2008-07-14 ENCOUNTER — Ambulatory Visit: Payer: Self-pay | Admitting: Obstetrics & Gynecology

## 2008-07-28 ENCOUNTER — Encounter: Payer: Self-pay | Admitting: Obstetrics & Gynecology

## 2008-07-28 ENCOUNTER — Ambulatory Visit: Payer: Self-pay | Admitting: Family Medicine

## 2008-07-28 ENCOUNTER — Ambulatory Visit (HOSPITAL_COMMUNITY): Admission: RE | Admit: 2008-07-28 | Discharge: 2008-07-28 | Payer: Self-pay | Admitting: Family Medicine

## 2008-08-03 ENCOUNTER — Telehealth (INDEPENDENT_AMBULATORY_CARE_PROVIDER_SITE_OTHER): Payer: Self-pay | Admitting: Family Medicine

## 2008-08-15 ENCOUNTER — Telehealth: Payer: Self-pay | Admitting: *Deleted

## 2008-08-18 ENCOUNTER — Ambulatory Visit (HOSPITAL_COMMUNITY): Admission: RE | Admit: 2008-08-18 | Discharge: 2008-08-18 | Payer: Self-pay | Admitting: Family Medicine

## 2008-08-18 ENCOUNTER — Ambulatory Visit: Payer: Self-pay | Admitting: Obstetrics & Gynecology

## 2008-08-18 LAB — CONVERTED CEMR LAB
HCT: 27.2 % — ABNORMAL LOW (ref 36.0–46.0)
Hemoglobin: 9.2 g/dL — ABNORMAL LOW (ref 12.0–15.0)
MCHC: 33.8 g/dL (ref 30.0–36.0)
MCV: 91.9 fL (ref 78.0–100.0)
Platelets: 233 10*3/uL (ref 150–400)
RBC: 2.96 M/uL — ABNORMAL LOW (ref 3.87–5.11)
RDW: 14.4 % (ref 11.5–15.5)
WBC: 8.4 10*3/uL (ref 4.0–10.5)

## 2008-09-01 ENCOUNTER — Ambulatory Visit: Payer: Self-pay | Admitting: Obstetrics and Gynecology

## 2008-09-01 ENCOUNTER — Ambulatory Visit: Payer: Self-pay | Admitting: Obstetrics & Gynecology

## 2008-09-01 ENCOUNTER — Inpatient Hospital Stay (HOSPITAL_COMMUNITY): Admission: AD | Admit: 2008-09-01 | Discharge: 2008-09-01 | Payer: Self-pay | Admitting: Family Medicine

## 2008-09-05 ENCOUNTER — Ambulatory Visit: Payer: Self-pay | Admitting: Family Medicine

## 2008-09-08 ENCOUNTER — Ambulatory Visit: Payer: Self-pay | Admitting: Obstetrics & Gynecology

## 2008-09-08 ENCOUNTER — Ambulatory Visit (HOSPITAL_COMMUNITY): Admission: RE | Admit: 2008-09-08 | Discharge: 2008-09-08 | Payer: Self-pay | Admitting: Family Medicine

## 2008-09-12 ENCOUNTER — Ambulatory Visit: Payer: Self-pay | Admitting: Obstetrics & Gynecology

## 2008-09-15 ENCOUNTER — Ambulatory Visit: Payer: Self-pay | Admitting: Obstetrics & Gynecology

## 2008-09-15 ENCOUNTER — Encounter: Payer: Self-pay | Admitting: Obstetrics & Gynecology

## 2008-09-19 ENCOUNTER — Ambulatory Visit: Payer: Self-pay | Admitting: Obstetrics & Gynecology

## 2008-09-22 ENCOUNTER — Ambulatory Visit: Payer: Self-pay | Admitting: Family Medicine

## 2008-09-27 ENCOUNTER — Ambulatory Visit: Payer: Self-pay | Admitting: Obstetrics & Gynecology

## 2008-09-29 ENCOUNTER — Ambulatory Visit (HOSPITAL_COMMUNITY): Admission: RE | Admit: 2008-09-29 | Discharge: 2008-09-29 | Payer: Self-pay | Admitting: Family Medicine

## 2008-10-03 ENCOUNTER — Inpatient Hospital Stay (HOSPITAL_COMMUNITY): Admission: AD | Admit: 2008-10-03 | Discharge: 2008-10-03 | Payer: Self-pay | Admitting: Obstetrics & Gynecology

## 2008-10-03 ENCOUNTER — Ambulatory Visit: Payer: Self-pay | Admitting: Obstetrics and Gynecology

## 2008-10-06 ENCOUNTER — Ambulatory Visit: Payer: Self-pay | Admitting: Family Medicine

## 2008-10-06 ENCOUNTER — Encounter: Payer: Self-pay | Admitting: Family

## 2008-10-10 ENCOUNTER — Ambulatory Visit: Payer: Self-pay | Admitting: Family Medicine

## 2008-10-13 ENCOUNTER — Ambulatory Visit: Payer: Self-pay | Admitting: Family Medicine

## 2008-10-14 ENCOUNTER — Encounter: Payer: Self-pay | Admitting: Obstetrics & Gynecology

## 2008-10-14 LAB — CONVERTED CEMR LAB
Chlamydia, DNA Probe: NEGATIVE
GC Probe Amp, Genital: NEGATIVE

## 2008-10-17 ENCOUNTER — Ambulatory Visit: Payer: Self-pay | Admitting: Obstetrics & Gynecology

## 2008-10-20 ENCOUNTER — Ambulatory Visit: Payer: Self-pay | Admitting: Family Medicine

## 2008-10-21 ENCOUNTER — Inpatient Hospital Stay (HOSPITAL_COMMUNITY): Admission: RE | Admit: 2008-10-21 | Discharge: 2008-10-25 | Payer: Self-pay | Admitting: Obstetrics and Gynecology

## 2008-10-21 ENCOUNTER — Ambulatory Visit: Payer: Self-pay | Admitting: Obstetrics & Gynecology

## 2008-10-22 ENCOUNTER — Ambulatory Visit: Payer: Self-pay | Admitting: Family Medicine

## 2008-10-22 ENCOUNTER — Encounter: Payer: Self-pay | Admitting: Obstetrics & Gynecology

## 2008-10-26 ENCOUNTER — Telehealth (INDEPENDENT_AMBULATORY_CARE_PROVIDER_SITE_OTHER): Payer: Self-pay | Admitting: Family Medicine

## 2008-10-27 ENCOUNTER — Telehealth (INDEPENDENT_AMBULATORY_CARE_PROVIDER_SITE_OTHER): Payer: Self-pay | Admitting: Family Medicine

## 2008-10-28 ENCOUNTER — Ambulatory Visit: Payer: Self-pay | Admitting: Family Medicine

## 2008-10-29 ENCOUNTER — Inpatient Hospital Stay (HOSPITAL_COMMUNITY): Admission: AD | Admit: 2008-10-29 | Discharge: 2008-10-31 | Payer: Self-pay | Admitting: Family Medicine

## 2008-12-05 ENCOUNTER — Encounter (INDEPENDENT_AMBULATORY_CARE_PROVIDER_SITE_OTHER): Payer: Self-pay | Admitting: Family Medicine

## 2008-12-14 ENCOUNTER — Ambulatory Visit: Payer: Self-pay | Admitting: Family Medicine

## 2009-01-04 ENCOUNTER — Telehealth (INDEPENDENT_AMBULATORY_CARE_PROVIDER_SITE_OTHER): Payer: Self-pay | Admitting: Family Medicine

## 2009-01-04 ENCOUNTER — Telehealth: Payer: Self-pay | Admitting: Family Medicine

## 2009-01-05 ENCOUNTER — Encounter (INDEPENDENT_AMBULATORY_CARE_PROVIDER_SITE_OTHER): Payer: Self-pay | Admitting: *Deleted

## 2009-01-09 ENCOUNTER — Telehealth (INDEPENDENT_AMBULATORY_CARE_PROVIDER_SITE_OTHER): Payer: Self-pay | Admitting: *Deleted

## 2009-01-10 ENCOUNTER — Encounter: Payer: Self-pay | Admitting: *Deleted

## 2009-01-17 ENCOUNTER — Ambulatory Visit: Payer: Self-pay | Admitting: Family Medicine

## 2009-01-25 ENCOUNTER — Telehealth: Payer: Self-pay | Admitting: Family Medicine

## 2009-02-02 ENCOUNTER — Telehealth: Payer: Self-pay | Admitting: *Deleted

## 2009-02-06 ENCOUNTER — Encounter: Payer: Self-pay | Admitting: Family Medicine

## 2009-02-06 ENCOUNTER — Ambulatory Visit: Payer: Self-pay | Admitting: Family Medicine

## 2009-02-06 DIAGNOSIS — M25551 Pain in right hip: Secondary | ICD-10-CM

## 2009-02-06 DIAGNOSIS — G8929 Other chronic pain: Secondary | ICD-10-CM | POA: Insufficient documentation

## 2009-02-08 ENCOUNTER — Encounter: Admission: RE | Admit: 2009-02-08 | Discharge: 2009-02-08 | Payer: Self-pay | Admitting: Orthopedic Surgery

## 2009-02-20 ENCOUNTER — Encounter: Payer: Self-pay | Admitting: Family Medicine

## 2009-03-29 ENCOUNTER — Encounter: Admission: RE | Admit: 2009-03-29 | Discharge: 2009-03-29 | Payer: Self-pay | Admitting: Orthopedic Surgery

## 2009-04-19 ENCOUNTER — Encounter: Payer: Self-pay | Admitting: Family Medicine

## 2009-05-08 ENCOUNTER — Ambulatory Visit: Payer: Self-pay | Admitting: Family Medicine

## 2009-08-02 ENCOUNTER — Ambulatory Visit: Payer: Self-pay | Admitting: Family Medicine

## 2009-08-09 ENCOUNTER — Ambulatory Visit: Payer: Self-pay | Admitting: Family Medicine

## 2009-08-10 ENCOUNTER — Telehealth: Payer: Self-pay | Admitting: Family Medicine

## 2009-09-05 ENCOUNTER — Telehealth: Payer: Self-pay | Admitting: Family Medicine

## 2009-09-08 ENCOUNTER — Telehealth: Payer: Self-pay | Admitting: *Deleted

## 2009-09-13 ENCOUNTER — Telehealth (INDEPENDENT_AMBULATORY_CARE_PROVIDER_SITE_OTHER): Payer: Self-pay | Admitting: *Deleted

## 2009-10-02 ENCOUNTER — Encounter: Payer: Self-pay | Admitting: Family Medicine

## 2009-10-02 ENCOUNTER — Ambulatory Visit: Payer: Self-pay | Admitting: Family Medicine

## 2009-10-02 LAB — CONVERTED CEMR LAB
ALT: 8 units/L (ref 0–35)
AST: 15 units/L (ref 0–37)
Albumin: 4.5 g/dL (ref 3.5–5.2)
Alkaline Phosphatase: 61 units/L (ref 39–117)
BUN: 12 mg/dL (ref 6–23)
Beta hcg, urine, semiquantitative: NEGATIVE
CO2: 24 meq/L (ref 19–32)
Calcium: 9 mg/dL (ref 8.4–10.5)
Chloride: 106 meq/L (ref 96–112)
Creatinine, Ser: 1.16 mg/dL (ref 0.40–1.20)
Glucose, Bld: 82 mg/dL (ref 70–99)
HCT: 33.4 % — ABNORMAL LOW (ref 36.0–46.0)
Hemoglobin: 11 g/dL — ABNORMAL LOW (ref 12.0–15.0)
MCHC: 32.9 g/dL (ref 30.0–36.0)
MCV: 85.6 fL (ref 78.0–100.0)
Platelets: 264 10*3/uL (ref 150–400)
Potassium: 3.7 meq/L (ref 3.5–5.3)
RBC: 3.9 M/uL (ref 3.87–5.11)
RDW: 13.4 % (ref 11.5–15.5)
Sodium: 139 meq/L (ref 135–145)
Total Bilirubin: 0.6 mg/dL (ref 0.3–1.2)
Total Protein: 6.9 g/dL (ref 6.0–8.3)
WBC: 6.2 10*3/uL (ref 4.0–10.5)

## 2009-10-03 ENCOUNTER — Telehealth: Payer: Self-pay | Admitting: Family Medicine

## 2009-10-03 ENCOUNTER — Encounter: Payer: Self-pay | Admitting: Family Medicine

## 2009-11-10 IMAGING — US US OB COMP LESS 14 WK
1 series · 14 of 28 positions shown · non-contrast
Comparison: none

CLINICAL DATA: Pain, pregnant.
 OBSTETRICAL ULTRASOUND <14 WKS AND TRANSVAGINAL OB US:
TECHNIQUE: Both transabdominal and transvaginal ultrasound examinations were performed for complete evaluation of the gestation as well as the maternal uterus, adnexal regions, and pelvic cul-de-sac.

[Series 1: us ob comp less 14 wk · 0.24mm/px · 14 of 41 slices shown]
[im 2/41]
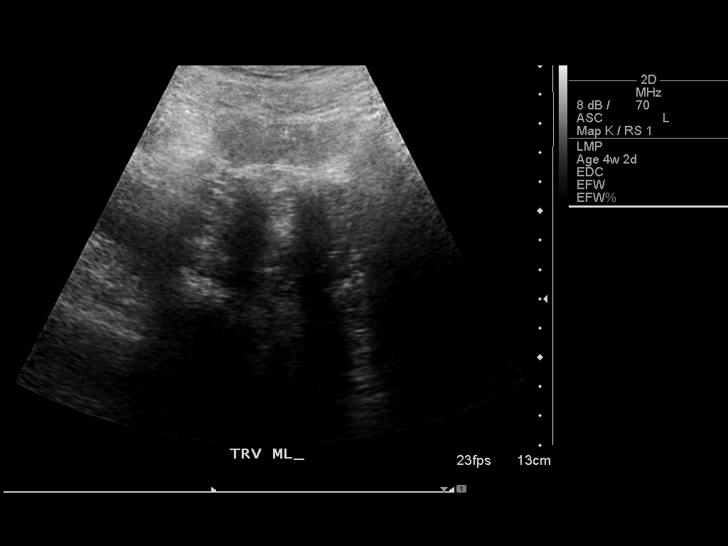
[im 5/41]
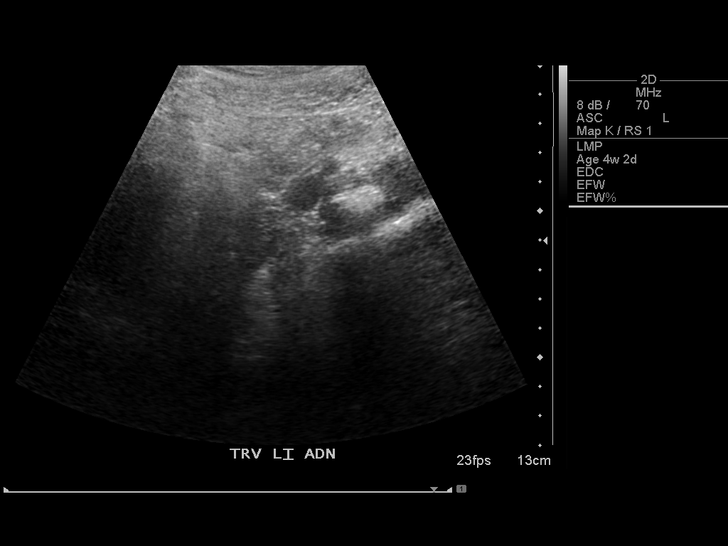
[im 8/41]
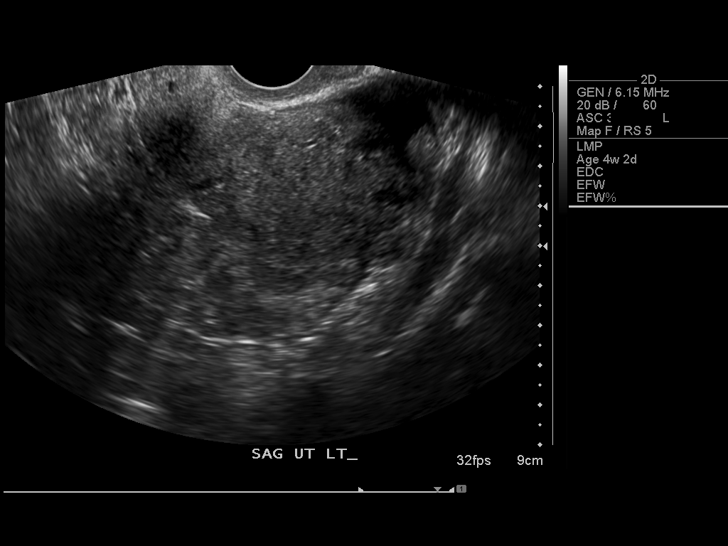
[im 11/41]
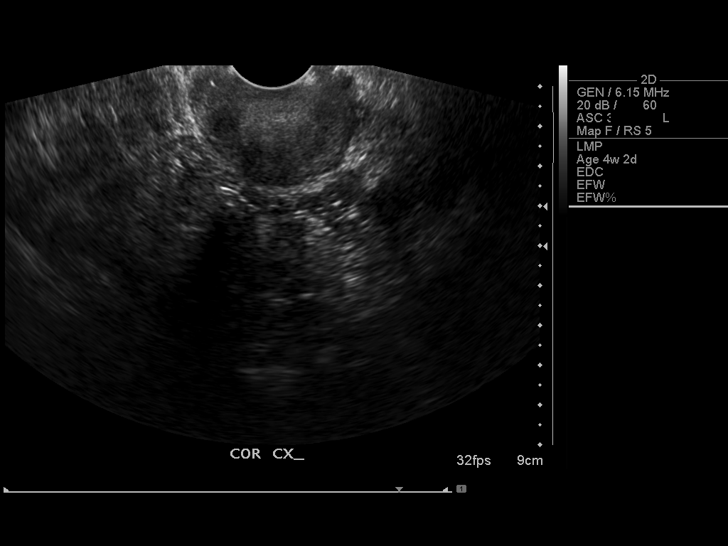
[im 14/41]
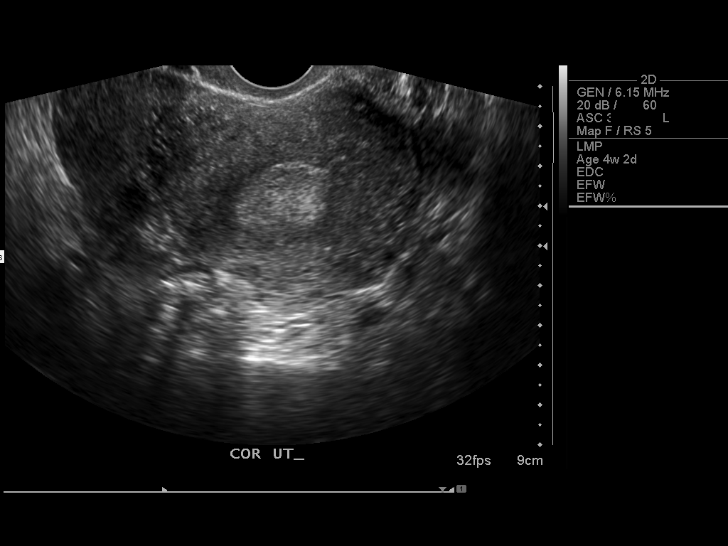
[im 17/41]
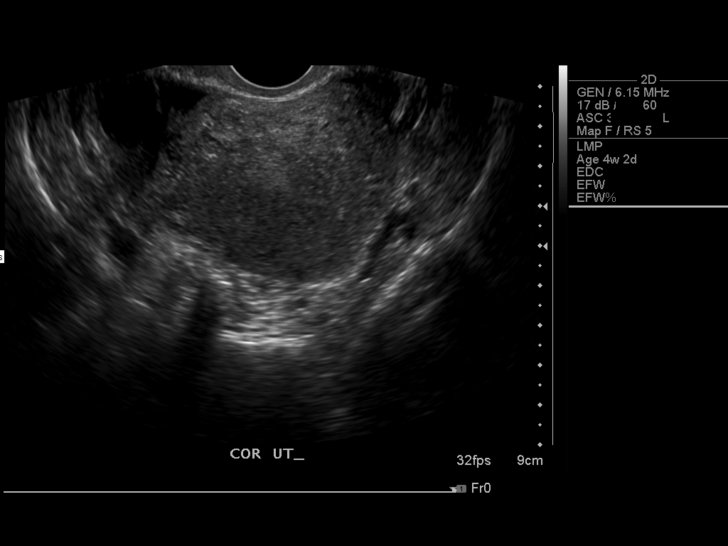
[im 20/41]
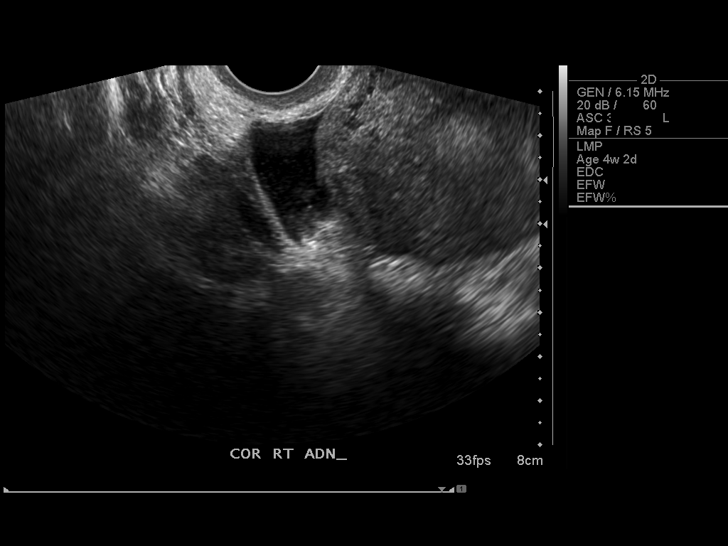
[im 23/41]
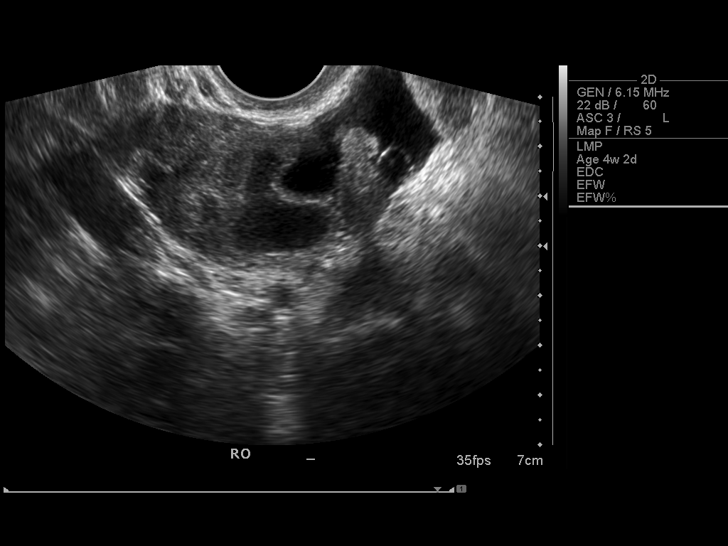
[im 26/41]
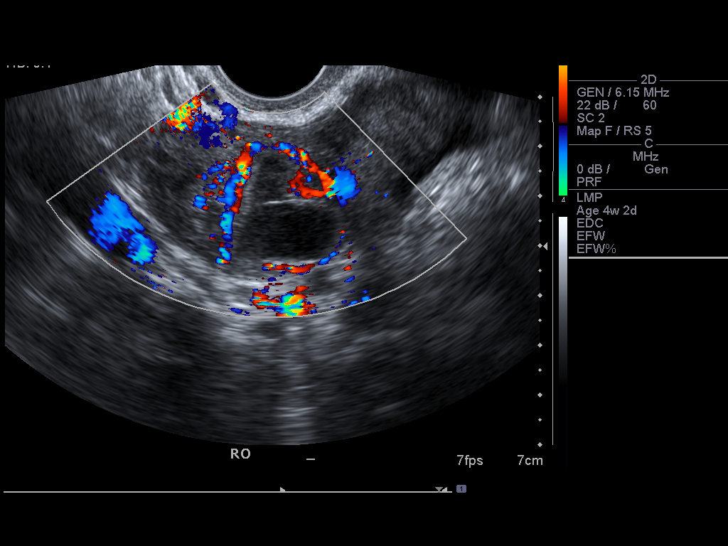
[im 29/41]
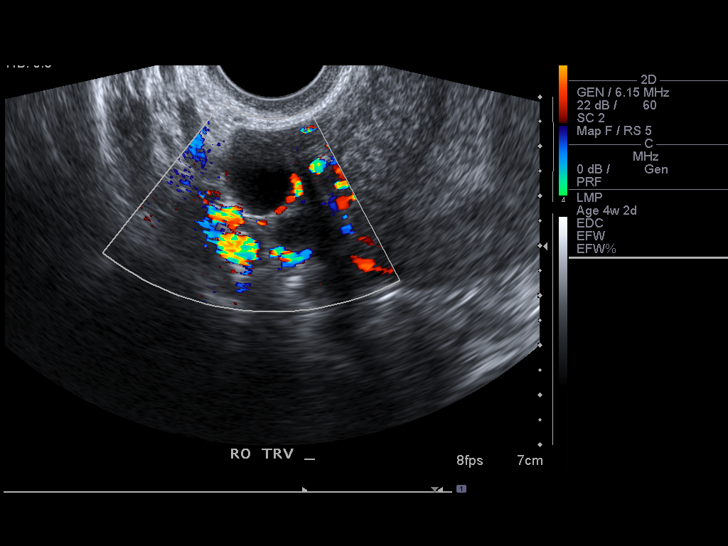
[im 32/41]
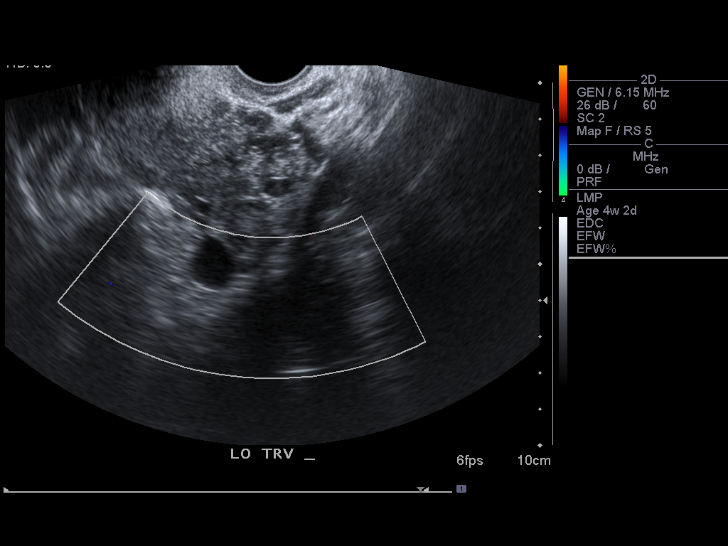
[im 35/41]
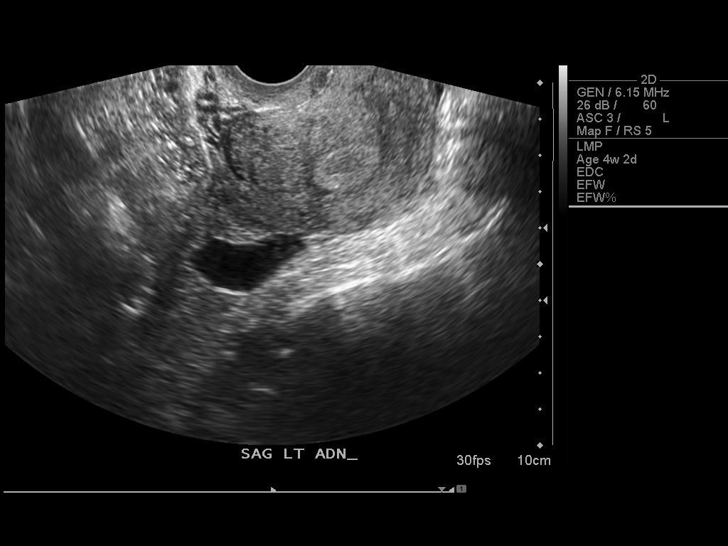
[im 38/41]
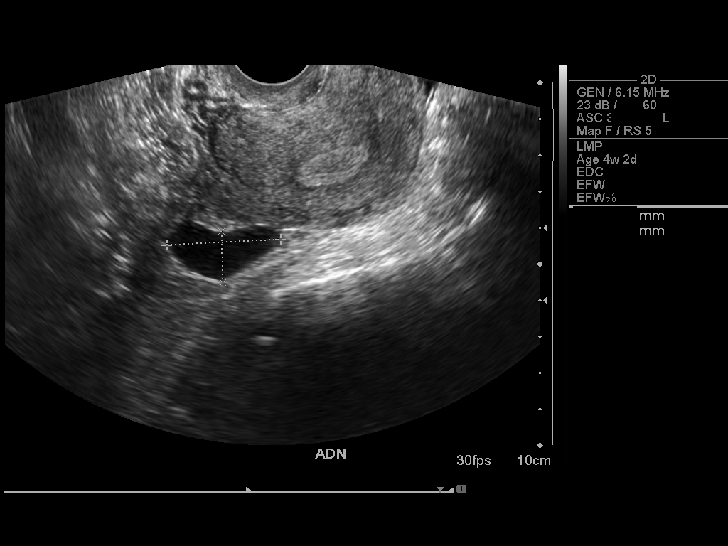
[im 41/41]
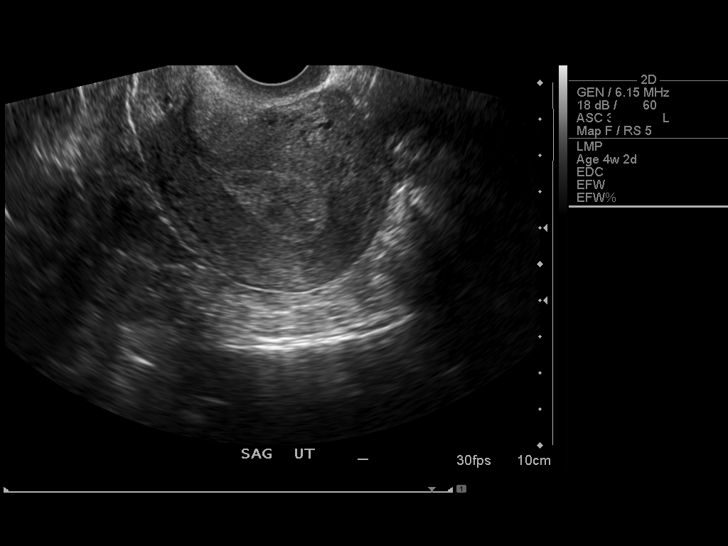

[14 of 28 positions shown; findings below may reference images not displayed]

FINDINGS: The uterus is retroflexed.  There is no gestational sac, yolk sac, or fetal pole.  The endometrium is somewhat heterogenous and thickened measuring up to 22 mm.  The left ovary is unremarkable.  There is a small complex cyst within the right ovary likely a corpus luteum cyst.  No adnexal masses are seen.  Trace free fluid is noted.
IMPRESSION: No gestational sac or adnexal masses are seen.  The above findings are nonspecific and represent early pregnancy, spontaneous abortion, or ectopic pregnancy is not excluded.

## 2009-12-12 ENCOUNTER — Telehealth: Payer: Self-pay | Admitting: *Deleted

## 2009-12-29 ENCOUNTER — Ambulatory Visit: Payer: Self-pay | Admitting: Family Medicine

## 2010-03-15 ENCOUNTER — Telehealth: Payer: Self-pay | Admitting: *Deleted

## 2010-03-21 ENCOUNTER — Ambulatory Visit: Payer: Self-pay | Admitting: Family Medicine

## 2010-04-24 ENCOUNTER — Telehealth: Payer: Self-pay | Admitting: Family Medicine

## 2010-05-22 ENCOUNTER — Encounter (INDEPENDENT_AMBULATORY_CARE_PROVIDER_SITE_OTHER): Payer: Self-pay | Admitting: *Deleted

## 2010-05-23 ENCOUNTER — Telehealth: Payer: Self-pay | Admitting: Family Medicine

## 2010-06-05 ENCOUNTER — Telehealth: Payer: Self-pay | Admitting: Family Medicine

## 2010-06-14 ENCOUNTER — Ambulatory Visit: Payer: Self-pay | Admitting: Family Medicine

## 2010-06-14 ENCOUNTER — Encounter
Admission: RE | Admit: 2010-06-14 | Discharge: 2010-06-14 | Payer: Self-pay | Source: Home / Self Care | Attending: Family Medicine | Admitting: Family Medicine

## 2010-06-14 DIAGNOSIS — M654 Radial styloid tenosynovitis [de Quervain]: Secondary | ICD-10-CM | POA: Insufficient documentation

## 2010-06-14 DIAGNOSIS — M79609 Pain in unspecified limb: Secondary | ICD-10-CM | POA: Insufficient documentation

## 2010-06-19 ENCOUNTER — Telehealth: Payer: Self-pay | Admitting: Family Medicine

## 2010-07-04 ENCOUNTER — Ambulatory Visit: Admission: RE | Admit: 2010-07-04 | Discharge: 2010-07-04 | Payer: Self-pay | Source: Home / Self Care

## 2010-07-06 ENCOUNTER — Ambulatory Visit: Admission: RE | Admit: 2010-07-06 | Discharge: 2010-07-06 | Payer: Self-pay | Source: Home / Self Care

## 2010-07-15 ENCOUNTER — Encounter: Payer: Self-pay | Admitting: Obstetrics & Gynecology

## 2010-07-23 ENCOUNTER — Telehealth: Payer: Self-pay | Admitting: Family Medicine

## 2010-07-24 NOTE — Progress Notes (Signed)
Summary: Ref Prob  Phone Note Call from Patient Call back at Home Phone [phone]   Caller: Patient Summary of Call: pt can't go on the 19th and she no longer has [employer] as ins she has Medicaid.  Please call her before rescheduling app. Initial call taken by: Clydell Hakim,  January 09, 2009 11:43 AM  Follow-up for Phone Call        Called Delbert Harness and gave NPI number.  She will be rescheduled for 01/23/09 at 4 pm. Follow-up by: Modesta Messing LPN,  January 09, 2009 11:55 AM

## 2010-07-24 NOTE — Progress Notes (Signed)
Summary: status of medication  Medications Added D 1000 1000 UNIT CAPS (CHOLECALCIFEROL) Take 1 capsule by mouth once a day FOLIC ACID 1 MG TABS (FOLIC ACID) Take 1 tablet by mouth once a day HYDRALAZINE HCL 10 MG TABS (HYDRALAZINE HCL) Take 1 tablet by mouth three times a day IBUPROFEN 800 MG TABS (IBUPROFEN) Take 1 tablet by mouth every eight hours LABETALOL HCL 300 MG TABS (LABETALOL HCL) Take 1 tablet by mouth twice a day OXYCONTIN 10 MG TB12 (OXYCODONE HCL) Take 1 tablet by mouth twice a day PLAQUENIL 200 MG TABS (HYDROXYCHLOROQUINE SULFATE) 2 tablet by mouth once a day PREDNISONE 20 MG TABS (PREDNISONE) Take 1 tablet by mouth once a day       Phone Note Call from Patient Call back at [phone]   Reason for Call: Talk to Doctor Summary of Call: pt is requesting to speak with MD re: her medication, she canceled her appt she had this am b/c she didn't know she even had one and she has to go to work, she would like call back ASAP Initial call taken by: ERIN LEVAN,  January 09, 2007 8:36 AM  Follow-up for Phone Call        [facility] answer. Follow-up by: Wilhemina Bonito  MD,  January 09, 2007 3:04 PM  Additional Follow-up for Phone Call Additional follow up Details #1::        spoke with pt on Friday.  Pt instructed to call medicaid office regarding form.   Additional Follow-up by: Wilhemina Bonito  MD,  January 12, 2007 11:07 AM    New/Updated Medications: D 1000 1000 UNIT CAPS (CHOLECALCIFEROL) Take 1 capsule by mouth once a day FOLIC ACID 1 MG TABS (FOLIC ACID) Take 1 tablet by mouth once a day HYDRALAZINE HCL 10 MG TABS (HYDRALAZINE HCL) Take 1 tablet by mouth three times a day IBUPROFEN 800 MG TABS (IBUPROFEN) Take 1 tablet by mouth every eight hours LABETALOL HCL 300 MG TABS (LABETALOL HCL) Take 1 tablet by mouth twice a day OXYCONTIN 10 MG TB12 (OXYCODONE HCL) Take 1 tablet by mouth twice a day PLAQUENIL 200 MG TABS (HYDROXYCHLOROQUINE SULFATE) 2 tablet by mouth once a day PREDNISONE 20  MG TABS (PREDNISONE) Take 1 tablet by mouth once a day

## 2010-07-24 NOTE — Assessment & Plan Note (Signed)
Summary: RECHECK BP/WK   Vital Signs:  Patient Profile:   30 Years Old Female Weight:      170.31 pounds Pulse rate:   73 / minute BP sitting:   182 / 111  Pt. in pain?   yes    Location:   hip  Vitals Entered By: Dennison Nancy RN (August 28, 2006 2:59 PM)                History of Present Illness: CC:  FU HTN  Hypertension : Pt's blood pressure elevated today.  Recently switched off of lisinopril due to the fact that pt desires to get pregnant.  No current problems with medications. Lightheadedness:  _x_ none or Chest pain with exertion:- _x_ none or    Pt is also in pain today.  Pt has PMH of osteonecrosis of R hip.  Pt takes Oxycontin SR (DAW) 10mg  by mouth two times a day.  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  Pt requests a form of a generic physical for her.        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 No apparent alopecia or balding. Eyes:     No corneal or conjunctival inflammation noted. EOMI. Perrla. Vision grossly normal. Mouth:     Oral mucosa and oropharynx without lesions or exudates.  Teeth in good repair. Neck:     No deformities, masses, or tenderness noted. Chest Wall:     No deformities, masses, or tenderness noted. Lungs:     Normal respiratory effort, chest expands symmetrically. Lungs are clear to auscultation, no crackles or wheezes. Heart:     Normal rate and regular rhythm. S1 and S2 normal without gallop, murmur, click, rub or other extra sounds. Extremities:     Pain with external rotation of R hip.  Left hip normal with FROM.  Pulses equal  bilaterally. Neurologic:     alert & oriented X3 and gait normal.      Impression & Recommendations:  Problem # 1:  HYPERTENSION, BENIGN SYSTEMIC (ICD-401.1) Elevated today.  Recently changed medications to labetalol 100mg  two times a day and taken off of lisinopril due to pt desiring future pregnancy.  Will increase to 200mg  by mouth two times a day.  Will have pt to schedule nurse's visit in 1 week.  If bp still >120/80 will increase to 300mg  by mouth two times a day.  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   Problem # 3:  Preventive Health Care (ICD-V70.0) Pt desires to become pregnant. Will give RX for Prenatal vitamins with DHEA.  Will have pt perform basal body temp readings.    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  Pt does not need refill currently.  Pt to limit standing to only 1 hr at a time.  Pt to not lift heavy objects > 20 lbs.  Pt on chronic steroids.  Pt understands that chronic steriods can cause bone problems, including but not limited to bone fractures including hips and avascular necrosis of the hips.    Patient Instructions: 1)  Please schedule a follow-up appointment in 1 weeks as nurse's visit for blood pressure check, if >120/80 will increase Labetalol to 300mg  by mouth two times a day. 2)  Please schedule a follow-up appointment in 1 month with primary doctor to evaluate pain, htn, preg status. 3)  Please obtain basal body temp kit from drug store and record appropriately.

## 2010-07-24 NOTE — Progress Notes (Signed)
Summary: VICODIN REFILL  Phone Note Refill Request Message from:  Patient on March 07, 2008 3:00 PM  Refills Requested: Medication #1:  VICODIN HP 10-660 MG TABS 1 tablet every 4-6 hrs  for pain..   Dosage confirmed as above?Dosage Confirmed   Brand Name Necessary? No   Supply Requested: 1 month [facility]   Method Requested: Electronic Initial call taken by: Dedra Skeens CMA,,  March 07, 2008 3:00 PM  Follow-up for Phone Call        checking status. Follow-up by: Haydee Salter,  March 08, 2008 2:11 PM      Prescriptions: VICODIN HP 10-660 MG TABS (HYDROCODONE-ACETAMINOPHEN) 1 tablet every 4-6 hrs  for pain.  #120 x 3   Entered and Authorized by:   Sylvan Cheese MD   Signed by:   Sylvan Cheese MD on 03/08/2008   Method used:   Printed then faxed to ...       [facility]  [address] [phone]* (retail)       [address] [place], [place]  [address]       Ph: [phone] or [phone]       Fax: [phone]   RxID:   [id number]  placed in pile to be faxed. Sylvan Cheese MD  March 08, 2008 2:15 PM

## 2010-07-24 NOTE — Assessment & Plan Note (Signed)
Summary: HTN, knee pain, pain med refills   Vital Signs:  Patient Profile:   30 Years Old Female Weight:      176 pounds Pulse rate:   112 / minute BP sitting:   163 / 109  (right arm)  Pt. in pain?   yes    Location:   left knee    Intensity:   5  Vitals Entered By: Arlyss Repress CMA, (June 02, 2007 4:07 PM)              Is Patient Diabetic? No     PCP:  MELISSA LOVE  MD  Chief Complaint:  1.)f/up HTN. meds were changed 2.) refill pain meds..vicodin 3.) left knee pain x 1 day.  History of Present Illness: Pt is here today for follow up Hypertension.  116/63 taken around midday) highest at home was 143/78.  pt states it has been ok, pt hasn't checked it in 5 days, since she recently lost her friend.    HTN:  Using medications:yes Chest Pain: no SOB:no HA:no Leg Edema:no Previous hx of CVA/TIA:no   Seen Dr. Phylliss Bob: had bloodwork done, had an xray done at his office.  Pt states that Dr. Phylliss Bob wanted to put her on Plaquenil, which is class C, for Lupus.  Pt saw Dr. Phylliss Bob on Dec 1st.  pt wants to talk about risks of this medication.     Knee pain- L knee, popping, with deep flexion, no fluid or swelling, pain today, has not taken vicodin today.      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 Lungs are clear to auscultation, no crackles or wheezes. Heart:     Normal rate and  regular rhythm. S1 and S2 normal without gallop, murmur, click, rub or other extra sounds. Extremities:     popping over patella with flexion of R knee, no pain at joint line, no swelling, normal gait.  L knee normal exam with FROM.      Impression & Recommendations:  Problem # 1:  HYPERTENSION, BENIGN SYSTEMIC (ICD-401.1) did not increase meds today. pt to bring in log book of bp readings from home at next visit.  pt is in pain today from her knee.   Her updated medication list for this problem includes:    Hydralazine Hcl 50 Mg Tabs (Hydralazine hcl) .Marland Kitchen... 1 tablet four times a day for blood pressure.    Labetalol Hcl 300 Mg Tabs (Labetalol hcl) .Marland Kitchen... Take 1 tablet by mouth twice a day  Orders: [facility]- Est  Level 4 (91478)   Problem # 2:  KNEE PAIN, LEFT, ACUTE (ICD-719.46) Assessment: New uncertain etiology , but may be due to overflexion with squatting.  No evidence of swelling or infection.  WIll continue current pain meds, apply ice, and elevate as needed.  if continues to bother her, becomes swollen, or worsening pain with unable to bear weight, pt to return to clinic for further evaluation.   Her updated medication list for this problem includes:    Vicodin Hp 10-660 Mg Tabs (Hydrocodone-acetaminophen) .Marland Kitchen... 1 tablet every 4-6 hrs  for pain.  Orders: [facility]- Est  Level 4 ([address])   Problem # 3:  SLE (ICD-710.0) reviewed plaquenil uses and category class C.  Instructed pt to keep followup appt with Rheumatology and to send results to me.   Her updated medication list for this problem includes:    Prednisone 20 Mg Tabs (Prednisone) .Marland Kitchen... 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  tolerating well   Her updated medication list for this problem includes:    D 1000 1000 Unit Caps (Cholecalciferol) .Marland Kitchen... Take 1 capsule by mouth once a day  Orders: [facility]- Est  Level 4 ([address])   Complete Medication  List: 1)  D 1000 1000 Unit Caps (Cholecalciferol) .... Take 1 capsule by mouth once a day 2)  Folic Acid 1 Mg Tabs (Folic acid) .... Take 1 tablet by mouth once a day 3)  Hydralazine Hcl 50 Mg Tabs (Hydralazine hcl) .Marland Kitchen.. 1 tablet four times a day for blood pressure. 4)  Labetalol Hcl 300 Mg Tabs (Labetalol hcl) .... Take 1 tablet by mouth twice a day 5)  Prednisone 20 Mg Tabs (Prednisone) .... Take 1 tablet by mouth once a day 6)  Vicodin Hp 10-660 Mg Tabs (Hydrocodone-acetaminophen) .Marland Kitchen.. 1 tablet every 4-6 hrs  for pain. 7)  Pre-natal Tabs (Prenatal multivit-min-fe-fa) .Marland Kitchen.. 1 tablet a day, may substitute for whatever is on medicaid formulary  Other Orders: HIV-[facility] ([id number]) RPR-[facility] [phone])   Patient Instructions: 1)  Please schedule a follow-up appointment in 1 month. 2)  Bring in yoru BP recordings.   3)  if knee continues to bother you, use, ice, limit the amt of deep squatting, and may call for a brace prescription.      Prescriptions: VICODIN HP 10-660 MG TABS (HYDROCODONE-ACETAMINOPHEN) 1 tablet every 4-6 hrs  for pain.  #120 x 0   Entered and Authorized by:   Wilhemina Bonito  MD   Signed by:   Wilhemina Bonito  MD on 06/02/2007   Method used:   Print then Give to Patient   RxID:   [id number]  ]

## 2010-07-24 NOTE — Progress Notes (Signed)
Summary: Physical  Phone Note Call from Patient Call back at [phone]   Reason for Call: Talk to Nurse Summary of Call: pt would like to speak with Delores about her work not having a physical form Initial call taken by: Haydee Salter,  August 27, 2006 9:36 AM  Follow-up for Phone Call        pt is returning [facility]' call from yesterday afternoon c Follow-up by: Haydee Salter,  August 28, 2006 9:09 AM  Additional Follow-up for Phone Call Additional follow up Details #1::        p.e. form completed by dr love and will be given to pt on 08/28/06 ...................................................................DELORES PATE CMA,  August 28, 2006 4:22 PM  Additional Follow-up by: Dedra Skeens CMA,,  August 28, 2006 4:22 PM   Additional Follow-up for Phone Call Additional follow up Details #2::    Noted.  Discussed with pt about form.  Pt given preprinted form for work.

## 2010-07-24 NOTE — Letter (Signed)
Summary: Generic Letter  [name]  [address]   [place], [place] [address]   Phone: [phone]  Fax: [phone]    09/28/2007  [facility] [address] [place], [place]  [address]  Dear Ms. Narasimhan,  Your lab results are as below and are within normal goals for your age.  If you have any further questions, please give [place] a call.   Sodium                    139 mEq/L                   135-145   Potassium                 3.5 mEq/L                   3.5-5.3   Chloride                  104 mEq/L                   96-112   CO2                       23 mEq/L                    19-32   Glucose                   86 mg/dL                    57-84   BUN                       12 mg/dL                    6-96   Creatinine                1.01 mg/dL                  0.40-1.20   Calcium                   8.4 mg/dL                   2.9-52.8  Tests: (2) Lipid Profile (413[address])   Cholesterol               172 mg/dL                   4-010     ATP III Classification:           < 200        mg/dL        Desirable          200 - 239     mg/dL        Borderline High          >= 240        mg/dL        High         Triglyceride              79 mg/dL                    <272   HDL Cholesterol  49 mg/dL                    >38   Total Chol/HDL Ratio      3.5 Ratio  VLDL Cholesterol (Calc)                             16 mg/dL                    7-56  LDL Cholesterol (Calc)                        [H]  107 mg/dL                   4-33    Sincerely,   MELISSA LOVE  MD [name]  Appended Document: Generic Letter Letter sent to pt via mail    ............................DELORES PATE-GADDY,CMA (AAMA)

## 2010-07-24 NOTE — Assessment & Plan Note (Signed)
Summary: pregnancy    Vital Signs:  Patient Profile:   30 Years Old Female LMP:     01/26/2008 Height:     64.5 inches Weight:      175.4 pounds BMI:     29.75 Temp:     98.6 degrees F oral Pulse rate:   73 / minute BP sitting:   134 / 79  (left arm) Cuff size:   regular  Pt. in pain?   no  Vitals Entered By: Garen Grams LPN (March 01, 2008 11:43 AM)  Menstrual History: LMP (date): 01/26/2008 EDC by LMP: 11/01/2008                  PCP:  Wilhemina Bonito  MD  Chief Complaint:  pap.  History of Present Illness: 30yr old AAF presents for pap smear but also reports she took a positive pregnancy test at home 2 days ago. LMP 01/26/08 and certain. This is a planned pregnancy. She has chronic HTN and lupus that are both fairly controlled.  +mild nausea but otherwise no complaints.     Updated Prior Medication List: HYDRALAZINE HCL 50 MG  TABS (HYDRALAZINE HCL) 1 tablet four times a day for blood pressure. LABETALOL HCL 300 MG TABS (LABETALOL HCL) Take 1 tablet by mouth twice a day PREDNISONE 10 MG  TABS (PREDNISONE)  VICODIN HP 10-660 MG TABS (HYDROCODONE-ACETAMINOPHEN) 1 tablet every 4-6 hrs  for pain.  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       Physical Exam  General:     NAD Genitalia:     Pelvic Exam:  External: normal female genitalia without lesions or masses        Vagina: normal without lesions or masses        Cervix: normal without lesions or masses        Adnexa: normal bimanual exam without masses or fullness        Uterus: normal by palpation        Pap smear: performed    Impression & Recommendations:  Problem # 1:  PREGNANCY, NORMAL (ICD-V22.2) Assessment: New Obtained bloodwork and pap, GC/CMZ today. Per Lupita Leash, pt has to come here for initial intake OB visit then can be referred to [facility]. Will schedule with Dr Yetta Barre.  Orders: GC/Chlamydia-[facility] ([id number]) Pap Smear-[facility] ([id number]) Prenatal-[facility] ([id number]) [facility]- Est Level  3 (03474)   Complete Medication List: 1)  Hydralazine Hcl 50 Mg Tabs (Hydralazine hcl) .Marland Kitchen.. 1 tablet four times a day for blood pressure. 2)  Labetalol Hcl 300 Mg Tabs (Labetalol hcl) .... Take 1 tablet by mouth twice a day 3)  Prednisone 10 Mg Tabs (Prednisone) 4)  Vicodin Hp 10-660 Mg Tabs (Hydrocodone-acetaminophen) .Marland Kitchen.. 1 tablet every 4-6 hrs  for pain.  Other Orders: U Preg-[facility] ([address])   Patient Instructions: 1)  Congratulations on your pregnancy!!! 2)  Continue taking your blood pressure medicines and your prednisone. These are safe to take.   3)  Keep taking your prenatal vitamin daily.  4)  We will refer you to [facility] for your prenatal care.    ] Laboratory Results   Urine Tests  Date/Time Received: March 01, 2008 11:49 AM  Date/Time Reported: March 01, 2008 11:52 AM     Urine HCG: positive Comments: ............test performed by...........Marland Kitchen Terese Door, CMA       OB Initial Intake Information    Race: Black    Marital status: Single  Menstrual History    LMP (date): 01/26/2008    EDC by LMP: 11/01/2008   Flowsheet View for Follow-up Visit    Weight:     175.4    Blood pressure:   134 / 79

## 2010-07-24 NOTE — Assessment & Plan Note (Signed)
Summary: cpp wk   Vital Signs:  Patient Profile:   30 Years Old Female Weight:      164 pounds Pulse rate:   81 / minute BP sitting:   138 / 87  Vitals Entered By: Lillia Pauls CMA (Nov 04, 2006 8:48 AM)               Chief Complaint:  CPP.  History of Present Illness: Pt is here today for Pap Smear.  Pt has no vaginal complaints including no discharge, no dysuria, and no abdominal pain.  Pt is also here for breast exams.  Pt occasionally performs monthly self breast exams.  No noted masses.  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 LMP: 10/25/06 Length of Menses: irregular, 2 days, light bleeding Avg Bleeding:light pt using ovulation kit and knows that she is ovulating.  BP much better control today.  Labetalol 300mg  two times a day.  has some feelings of loss of energy, most likely due to bb.   pt is still on prednisone for her lupus per her rheumatologist. 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    Reviewed meds in Dr. 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 No apparent alopecia or balding. Ears:     External ear exam shows no significant lesions or deformities.  Otoscopic examination reveals clear canals, tympanic membranes are intact bilaterally without bulging, retraction, inflammation or discharge. Hearing is grossly normal bilaterally. Nose:     External nasal examination shows no deformity or inflammation. Nasal mucosa are pink and moist without lesions or exudates. Mouth:     Oral mucosa and oropharynx without lesions or exudates.  Teeth in good repair. Neck:     No deformities, masses, or tenderness noted. Breasts:     No mass, nodules, thickening, tenderness, bulging, retraction, inflamation, nipple discharge or skin changes noted.  Fibrocystic changes noted.  Lungs:     Normal respiratory effort, chest expands symmetrically. Lungs are clear to auscultation, no crackles or wheezes. Heart:     Normal rate and regular rhythm. S1 and S2 normal without gallop, murmur, click, rub or other extra sounds. Abdomen:     Bowel sounds positive,abdomen soft and non-tender without masses, organomegaly or hernias noted. Genitalia:     Pelvic Exam:        External: normal female genitalia without lesions or masses  Vagina: normal without lesions or masses        Cervix: normal without lesions or masses        Adnexa: normal bimanual exam without masses or fullness        Uterus: normal by palpation        Pap smear: performed Msk:     No deformity or scoliosis noted of thoracic or lumbar spine.   Extremities:     decreased range of motion on extension of R leg.    Impression & Recommendations:  Problem # 1:  GYNECOLOGICAL EXAMINATION, ROUTINE (ICD-V72.31) Assessment: Unchanged hx of CIN1 pap last.  recheck today.  Orders: Pap Smear-[facility] ([address]109-60454) [facility]- Est  Level 4 (09811)   Problem # 2:  HYPERTENSION, BENIGN SYSTEMIC (ICD-401.1) Assessment: Improved will add hydralazine 10mg  by mouth  three times a day.  hopefully this will help get her to goal of 120/80 where we can decrease her labetalol since i do feel that she is somewhat symptomatic from the BB, Orders: [facility]- Est  Level 4 (91478) Basic Met-[facility] (29562-13086) UA Microalbumin-[facility] (57846)   Problem # 3:  PROTEINURIA (ICD-791.0) off aceinhibitor due to pt trying to get pregnant.  will recheck microalbuminemia and bmet today.  Orders: [facility]- Est  Level 4 (96295)   Problem # 4:  SLE (ICD-710.0) Assessment: Unchanged stable on prednisone.  followed by rheumatologist.  Orders: [facility]- Est  Level 4 (28413) Basic Met-[facility] (24401-02725) UA Microalbumin-[facility] (36644)   Problem # 5:  AVASCULAR NECROSIS, FEMORAL HEAD (ICD-733.42) will refill oxycontin via dr first.   Orders: [facility]- Est  Level 4 (03474)    Patient Instructions: 1)  Please schedule a follow-up appointment in 1 month for bp recheck and pain meds to be refilled.   Appended Document: microalbumin result  Laboratory Results   Urine Tests  Date/Time Recieved: Nov 04, 2006 9:33 AM  Date/Time Reported: Nov 04, 2006 10:08 AM  Microalbumin (urine): NEGATIVE mg/L    Comments: ...................................................................DONNA LORING  Nov 04, 2006 10:08 AM

## 2010-07-24 NOTE — Assessment & Plan Note (Signed)
Summary: [facility]  Nurse Visit    Allergies: No Known Drug Allergies  Medication Administration  Injection # 1:    Medication: Depo-Provera 150mg     Diagnosis: CONTRACEPTIVE MANAGEMENT (ICD-V25.09)    Route: IM    Site: R deltoid    Exp Date: 03/2011    Lot #: [id number]    Mfr: [email or web address]    Comments: received last  depo provera after birth of baby at [facility] on May 4 , 2010.  next depo due Oct 12 thru Apr 18, 2009    Patient tolerated injection without complications    Given by: Theresia Lo RN (January 17, 2009 12:09 PM)  Orders Added: 1)  Est Level 1- [facility] [04540] 2)  Depo-Provera 150mg  [J1055]   Medication Administration  Injection # 1:    Medication: Depo-Provera 150mg     Diagnosis: CONTRACEPTIVE MANAGEMENT (ICD-V25.09)    Route: IM    Site: R deltoid    Exp Date: 03/2011    Lot #: [id number]    Mfr: [email or web address]    Comments: received last  depo provera after birth of baby at [facility] on May 4 , 2010.  next depo due Oct 12 thru Apr 18, 2009    Patient tolerated injection without complications    Given by: Theresia Lo RN (January 17, 2009 12:09 PM)  Orders Added: 1)  Est Level 1- [facility] [47829] 2)  Depo-Provera 150mg  [J1055]

## 2010-07-24 NOTE — Assessment & Plan Note (Signed)
Summary: f/u visit per love/bmc   Vital Signs:  Patient Profile:   30 Years Old Female Weight:      171 pounds Temp:     98.5 degrees F Pulse rate:   75 / minute BP sitting:   164 / 98  Pt. in pain?   no  Vitals Entered By: [name] CMA (April 28, 2007 10:04 AM)                  PCP:  Wilhemina Bonito  MD  Chief Complaint:  f/u infertility.  History of Present Illness: 30 year old AAF with a history of lupus who presents for infertility x1 year.  Pt has been trying with fiance for the past year without success.  This fiance is different from father of her 30 year old son.  Fiance has no other children. Jamina was on depo approximately 5 years ago but stopped secondary to elevated blood pressure, etc.  Until 1 year ago was using condoms for birth control.  Has gotten the OK from her rheumatologist to start trying to get pregnant as her lupus has been in remission for several years now. Has been changed to B blocker for BP control during pregnancy.  BP elevated today but normall in 130s/70s with highest she has seen at home of approximately 155/80s.   Pt has been tested for ovulation and is ovulating regularly.  She also has no history of PID and her menses are not painful or particularly heavy.  Fiance has not been checked for fertility at this point.           Impression & Recommendations:  Problem # 1:  INFERTILITY, FEMALE NOS (ICD-628.9) Assessment: Unchanged Discussed for 15 minutes about infertility and plan for further evaluation.  Have recommended that she have her fiance get in touch with is PCP about getting his sperm checked for count and motility, etc.  Have asked that if possible he have this done prior to her next appointment with Dr Sandria Manly so that the results can be faxed over and available at her next appointment. If these results are normal would recommend further workup including HSG which needs to be done just after a period in case of early pregnancy.   At this point do not suspect that she has a anatomic problem with the fallopian tubes as she has low risk (no PID, no ectopics, no signs/symptoms of endometriosis).  We did counsel her about her blood pressure being elevated today and she states that she was nervous about the possiblity of having an HSG today.  She will keep a close eye on this until her next appointment. Also counseled on continuing to take folic acid and shse is getting a flu shot today.  Orders: [facility]- Est Level  3 (99213)   Complete Medication List: 1)  D 1000 1000 Unit Caps (Cholecalciferol) .... Take 1 capsule by mouth once a day 2)  Folic Acid 1 Mg Tabs (Folic acid) .... Take 1 tablet by mouth once a day 3)  Hydralazine Hcl 25 Mg Tabs (Hydralazine hcl) .Marland Kitchen.. 1 tablet 4 times a day for blood pressure in addition to labetalol. 4)  Labetalol Hcl 300 Mg Tabs (Labetalol hcl) .... Take 1 tablet by mouth twice a day 5)  Prednisone 20 Mg Tabs (Prednisone) .... Take 1 tablet by mouth once a day 6)  Vicodin Hp 10-660 Mg Tabs (Hydrocodone-acetaminophen) .Marland Kitchen.. 1 tablet every 4-6 hrs  for pain.   Patient Instructions: 1)  Please keep your regularly scheduled appointment with Dr Sandria Manly to recheck your blood pressure, etc. 2)  Please have your fiance see his doctor (preferably before your next appointment with Dr Sandria Manly) so that he may get his sperm checked for fertility purposes.  Please have those results faxed to the family practice center Attn Dr Sandria Manly at [phone]    ]  Appended Document: flu shot    Clinical Lists Changes  Orders: Added new Test order of Flu Vaccine & Administration [phone]) - Signed Observations: Added new observation of FLU VAXMFR: Sanofi Pasteur (04/28/2007 10:52) Added new observation of FLU VAXLOT: [id number] (04/28/2007 10:52) Added new observation of FLU VAX#1VIS: 12/21/04 version  (04/28/2007 10:52) Added new observation of FLU VAX EXP: 12/22/2007 (04/28/2007 10:52) Added new observation of FLU VAX DSE:  0.5 ml (04/28/2007 10:52) Added new observation of FLU VAX: Fluvax 3+ (04/28/2007 10:52)Flu Vaccine Consent Questions     Do you have a history of severe allergic reactions to this vaccine? no    Any prior history of allergic reactions to egg and/or gelatin? no    Do you have a sensitivity to the preservative Thimersol? no    Do you have a past history of Guillan-Barre Syndrome? no    Do you currently have an acute febrile illness? no    Have you ever had a severe reaction to latex? no    Vaccine information given and explained to patient? yes    Are you currently pregnant? no  Lot Number:[id number] Site Given Deltoid observation of FLU VAX: Fluvax 3+ (04/28/2007 10:52)     .fpcflu

## 2010-07-24 NOTE — Progress Notes (Signed)
Summary: Results  Phone Note Call from Patient Call back at Work Phone [phone]   Summary of Call: Pt is requesting pap smear results.  She states someone told her we would have them 2 days after test was performed. Initial call taken by: Haydee Salter,  July 07, 2007 9:39 AM  Follow-up for Phone Call        called and informed pt of results Follow-up by: Wilhemina Bonito  MD,  July 08, 2007 8:51 AM

## 2010-07-24 NOTE — Consult Note (Signed)
Summary: [facility]  [facility]   Imported By: De Nurse 04/27/2009 11:20:41  _____________________________________________________________________  External Attachment:    Type:   Image     Comment:   External Document

## 2010-07-24 NOTE — Progress Notes (Signed)
Summary: Medication  Phone Note Call from Patient Call back at Home Phone [phone]   Summary of Call: pt is wanting another rx for prenatal vitamins - states she lost rx Initial call taken by: Haydee Salter,  September 23, 2006 8:58 AM  Follow-up for Phone Call        pt states she lost actual prescription . explained that md sent rx by Dr. Tiajuana Amass and should be able to get without any problem . call back if needed Follow-up by: Theresia Lo RN,  September 23, 2006 9:59 AM

## 2010-07-24 NOTE — Progress Notes (Signed)
Summary: Refill  Phone Note Call from Patient Call back at Work Phone [phone]   Summary of Call: Pt is requesting a refill on vicodin. Initial call taken by: Haydee Salter,  September 08, 2007 10:13 AM  Follow-up for Phone Call        completed.  pt needs to come and pick up Rx.  please notify.  Follow-up by: Wilhemina Bonito  MD,  September 10, 2007 8:26 AM  Additional Follow-up for Phone Call Additional follow up Details #1::        called pt and informed Additional Follow-up by: Arlyss Repress CMA,,  September 10, 2007 8:41 AM      Prescriptions: VICODIN HP 10-660 MG TABS (HYDROCODONE-ACETAMINOPHEN) 1 tablet every 4-6 hrs  for pain.  #120 x 0   Entered and Authorized by:   Wilhemina Bonito  MD   Signed by:   Wilhemina Bonito  MD on 09/10/2007   Method used:   Print then Give to Patient   RxID:   [id number] VICODIN HP 10-660 MG TABS (HYDROCODONE-ACETAMINOPHEN) 1 tablet every 4-6 hrs  for pain.  #120 x 0   Entered and Authorized by:   Wilhemina Bonito  MD   Signed by:   Wilhemina Bonito  MD on 09/09/2007   Method used:   Electronically sent to ...       [facility]  [address] [facility] [id number]*       [address] [place], [place]  [address]       Ph: [phone] or [phone]       Fax: [phone]   RxID:   [id number]

## 2010-07-24 NOTE — Progress Notes (Signed)
Summary: Results  Phone Note Call from Patient Call back at [facility] Phone [phone] Call back at Work Phone [phone]   Summary of Call: Pt is requesting pap smear results. Initial call taken by: Haydee Salter,  June 29, 2007 3:00 PM  Follow-up for Phone Call        CALLED PT. PAP SMEAR RESULTS SHOULD BE BACK WITHIN THE NEXT 2 DAYS. Follow-up by: Arlyss Repress CMA,,  June 29, 2007 3:10 PM

## 2010-07-24 NOTE — Assessment & Plan Note (Signed)
Summary: DEPO/[facility]  Nurse Visit   Allergies: No Known Drug Allergies  Medication Administration  Injection # 1:    Medication: Depo-Provera 150mg     Diagnosis: CONTRACEPTIVE MANAGEMENT (ICD-V25.09)    Route: IM    Site: R deltoid    Exp Date: 07/2012    Lot #: [id number]    Mfr: [email or web address]    Comments: next depo due Sept 23 thru Mar 30, 2010    Patient tolerated injection without complications    Given by: Theresia Lo RN (December 29, 2009 11:22 AM)  Orders Added: 1)  Depo-Provera 150mg  [J1055] 2)  Admin of Injection (IM/SQ) [04540]   Medication Administration  Injection # 1:    Medication: Depo-Provera 150mg     Diagnosis: CONTRACEPTIVE MANAGEMENT (ICD-V25.09)    Route: IM    Site: R deltoid    Exp Date: 07/2012    Lot #: [id number]    Mfr: [email or web address]    Comments: next depo due Sept 23 thru Mar 30, 2010    Patient tolerated injection without complications    Given by: Theresia Lo RN (December 29, 2009 11:22 AM)  Orders Added: 1)  Depo-Provera 150mg  [J1055] 2)  Admin of Injection (IM/SQ) [47829]  Appended Document: Orders Update    Clinical Lists Changes  Medications: Added new medication of DEPO-PROVERA 150 MG/ML SUSP (MEDROXYPROGESTERONE ACETATE) q 3 months per protocol

## 2010-07-24 NOTE — Assessment & Plan Note (Signed)
Summary: f/up,tcb   Vital Signs:  Patient profile:   30 year old female Height:      64.5 inches Weight:      178 pounds BMI:     30.19 Temp:     98.1 degrees F oral Pulse rate:   86 / minute BP sitting:   138 / 89  (right arm) Cuff size:   regular  Vitals Entered By: Tessie Fass CMA (May 08, 2009 1:57 PM) CC: F/U BP, pain Is Patient Diabetic? No Pain Assessment Patient in pain? no        Primary Care Provider:  Helane Rima DO  CC:  F/U BP and pain.  History of Present Illness: Angela Walls is a 30 year old AAF with lupus and bilateral avascular necrosis presenting today for follow up of HTN, pain.  1. HTN: Taking Labetalol, HCTZ with no problems. Denies CP, SOB, N/V/D, HA, dizziness.  2. Lupus: followed by Dr. Azzie Roup.  she is currently having a flare in the form of joint pain.  3. Avascular necrosis of hips: chronic right hip pain, followed by Dr. Charlett Blake, has appointment today. pain has decreased somewhat since she has stopped working and is staying home with her infant full-time. she requests a refill of vicodin, she uses this only at night.     Habits & Providers  Alcohol-Tobacco-Diet     Tobacco Status: never  Current Medications (verified): 1)  Labetalol Hcl 300 Mg Tabs (Labetalol Hcl) .... Take 1 Tablet By Mouth Twice A Day 2)  Prednisone 10 Mg  Tabs (Prednisone) .Marland Kitchen.. 1 Tablet By Mouth Daily- Refills To Come From Dr. Phylliss Bob 3)  Vicodin Hp 10-660 Mg Tabs (Hydrocodone-Acetaminophen) .Marland Kitchen.. 1 Tablet Every 4-6 Hrs  For Pain. 4)  Prenatal 19  Tabs (Prenatal Vit-Dss-Fe Fum-Fa) .Marland Kitchen.. 1 Tablet By Mouth Daily 5)  Hydrochlorothiazide 25 Mg Tabs (Hydrochlorothiazide) .Marland Kitchen.. 1 Tablet By Mouth Daily For Blood Pressure  Allergies (verified): No Known Drug Allergies  Past History:  Past medical, surgical, family and social histories (including risk factors) reviewed, and no changes noted (except as noted below).  Past Medical History: Avascular necrosis of  hips, Dx 2006, on chronic pain meds with stable dose of vicodin, followed by Dr. Charlett Blake  Hx of iron deficiency anemia HTN Lupus, Dx 2003, followed by Dr. Dareen Piano [id number]- 1 vaginal birth (1999) and 1 c/s (2010).  Past Surgical History: Appendix, 2000 LTCS due to NRFHT, May 2010  Family History: Reviewed history from 03/09/2008 and no changes required. HTN- GM DM2- GM father- brain aneurysm mother- anemia brothers and sisters- healthy son- healthy  Social History: Reviewed history from 12/14/2008 and no changes required. Lives with son Lanny Cramp) and newborn baby Nurse, learning disability). Has a supportive mother, fiance Margaretmary Eddy different FOB than her 3 yr old son. Stay-at-home mom. No alcohol, tobacco, or drugs.Smoking Status:  never  Review of Systems General:  Denies chills and fever. CV:  Denies chest pain or discomfort. Resp:  Denies cough and shortness of breath. GI:  Denies change in bowel habits. MS:  Complains of joint pain.  Physical Exam  General:  Well-developed, well-nourished, in no acute distress; alert, appropriate and cooperative throughout examination. Vitals reviewed. Lungs:  Normal respiratory effort, chest expands symmetrically. Lungs are clear to auscultation, no crackles or wheezes. Heart:  Normal rate and regular rhythm. S1 and S2 normal without gallop, murmur, click, rub or other extra sounds. Extremities:  No edema. Psych:  normally interactive, good eye contact, and not anxious  appearing.     Impression & Recommendations:  Problem # 1:  HYPERTENSION, BENIGN SYSTEMIC (ICD-401.1) Assessment Unchanged  Continue current treatment. Her updated medication list for this problem includes:    Labetalol Hcl 300 Mg Tabs (Labetalol hcl) .Marland Kitchen... Take 1 tablet by mouth twice a day    Hydrochlorothiazide 25 Mg Tabs (Hydrochlorothiazide) .Marland Kitchen... 1 tablet by mouth daily for blood pressure  Orders: T-Basic Metabolic Panel [phone]) [facility]- Est Level  3  (09811)  Problem # 2:  HIP PAIN, RIGHT, CHRONIC (ICD-719.45) Assessment: Unchanged  Followed by Dr. Charlett Blake. Will give new Rx today.  Her updated medication list for this problem includes:    Vicodin Hp 10-660 Mg Tabs (Hydrocodone-acetaminophen) .Marland Kitchen... 1 tablet every 4-6 hrs  for pain.  Orders: [facility]- Est Level  3 (91478)  Problem # 3:  PAP SMEAR, ABNORMAL, ASCUS (ICD-795.01) Assessment: Unchanged  2009. Advised to schedule next visit in 3 months for FULL PHYSICAL WITH PAP.  Orders: [facility]- Est Level  3 (29562)  Complete Medication List: 1)  Labetalol Hcl 300 Mg Tabs (Labetalol hcl) .... Take 1 tablet by mouth twice a day 2)  Prednisone 10 Mg Tabs (Prednisone) .Marland Kitchen.. 1 tablet by mouth daily- refills to come from dr. Phylliss Bob 3)  Vicodin Hp 10-660 Mg Tabs (Hydrocodone-acetaminophen) .Marland Kitchen.. 1 tablet every 4-6 hrs  for pain. 4)  Prenatal 19 Tabs (Prenatal vit-dss-fe fum-fa) .Marland Kitchen.. 1 tablet by mouth daily 5)  Hydrochlorothiazide 25 Mg Tabs (Hydrochlorothiazide) .Marland Kitchen.. 1 tablet by mouth daily for blood pressure  Patient Instructions: 1)  It was nice to see you today! 2)  Come back in 3 months for a FULL PHYSICAL. Prescriptions: VICODIN HP 10-660 MG TABS (HYDROCODONE-ACETAMINOPHEN) 1 tablet every 4-6 hrs  for pain.  #120 x 2   Entered and Authorized by:   Helane Rima DO   Signed by:   Helane Rima DO on 05/08/2009   Method used:   Print then Give to Patient   RxID:   [id number]   Prevention & Chronic Care Immunizations   Influenza vaccine: Fluvax 3+  (04/28/2007)   Influenza vaccine due: 04/27/2008    Tetanus booster: 06/25/1999: Done.   Tetanus booster due: 06/24/2009    Pneumococcal vaccine: Not documented  Other Screening   Pap smear: ATYPICAL SQUAMOUS CELLS OF UNDETERMINED SIGNIFICANCE  (03/01/2008)   Pap smear due: 12/2007   Smoking status: never  (05/08/2009)  Hypertension   Last Blood Pressure: 138 / 89  (05/08/2009)   Serum creatinine: 0.86  (04/07/2008)   BMP  action: Ordered   Serum potassium 3.7  (04/07/2008)    Hypertension flowsheet reviewed?: Yes   Progress toward BP goal: At goal  Self-Management Support :   Personal Goals (by the next clinic visit) :      Personal blood pressure goal: 140/90  (05/08/2009)   Patient will work on the following items until the next clinic visit to reach self-care goals:     Medications and monitoring: take my medicines every day, bring all of my medications to every visit  (05/08/2009)     Eating: drink diet soda or water instead of juice or soda, eat more vegetables, use fresh or frozen vegetables, eat foods that are low in salt, eat baked foods instead of fried foods, eat fruit for snacks and desserts, limit or avoid alcohol  (05/08/2009)    Hypertension self-management support: Written self-care plan  (05/08/2009)   Hypertension self-care plan printed.

## 2010-07-24 NOTE — Medication Information (Signed)
Summary: Vicodin  Vicodin   Imported By: Haydee Salter 03/19/2007 15:17:07  _____________________________________________________________________  External Attachment:    Type:   Image     Comment:   External Document

## 2010-07-24 NOTE — Miscellaneous (Signed)
Summary: re: Dr.Voytek/TS  Clinical Lists Changes tried to call pt. unable to leave message due to long song playing.. waiting for pt to call back. pt needs to see dr.wallace (pcp) to be evaluated for visit with dr.voytek (orhopedic Careers adviser) .Arlyss Repress CMA,  January 05, 2009 12:16 PM   Called and left message to call us back.  Modesta Messing LPN  January 05, 2009 3:06 PM   Appended Document: Orders Update    Clinical Lists Changes  Orders: Added new Referral order of Orthopedic Referral (Ortho) - Signed   I know Laurina Fischl from her [facility] visit in May and feel comfortable sending her referral for avascular necrosis prior to first visit with me.   Appended Document: re: Dr.Voytek/TS Appt made at [facility] for 01/09/09 at 10:00 am.  Left message for pt to call us back.  Referral faxed.

## 2010-07-24 NOTE — Progress Notes (Signed)
Summary: rx req  Phone Note Call from Patient Call back at Home Phone [phone]   Caller: Patient Summary of Call: pt calling again today about getting pain medication says the motrin is not working.   Initial call taken by: Clydell Hakim,  Oct 27, 2008 9:48 AM  Follow-up for Phone Call        will send message to MD. 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  I am not in the office today.  please have Dr. Jimmey Ralph sign and fax Rx for vicodin.  thanks. Additional Follow-up by: Sylvan Cheese MD,  Oct 27, 2008 11:32 AM    Additional Follow-up for Phone Call Additional follow up Details #2::    Will forward to Dr. Jimmey Ralph. Follow-up by: Modesta Messing LPN,  Oct 27, 2593 11:35 AM  Additional Follow-up for Phone Call Additional follow up Details #3:: Details for Additional Follow-up Action Taken: Rx done and placed in to be faxed box or patient can pick up if she desires.  Additional Follow-up by: Cyndia Bent MD,  Oct 27, 2008 11:44 AM    Prescriptions: VICODIN HP 10-660 MG TABS (HYDROCODONE-ACETAMINOPHEN) 1 tablet every 4-6 hrs  for pain.  #120 x 2   Entered and Authorized by:   Cyndia Bent MD   Signed by:   Cyndia Bent MD on 10/27/2008   Method used:   Printed then faxed to ...       [facility]  [address] [phone]* (retail)       [address] [place], [place]  [address]       Ph: [phone] or [phone]       Fax: [phone]   RxID:   [id number]  Left message for pt. to call us back.  Phone has a very long song answering system.  Difficult to wait.  Modesta Messing LPN  Oct 29, 7614 10:08 AM

## 2010-07-24 NOTE — Assessment & Plan Note (Signed)
Summary: FU MEDS/KH   Vital Signs:  Patient Profile:   30 Years Old Female Temp:     97.6 degrees F Pulse rate:   78 / minute Resp:     16 per minute BP sitting:   151 / 83               Chief Complaint:  fatigue.  History of Present Illness: Pt complaining of fatigue.  Pt has been out of work for 2-3 weeks.   C.o leg pain and going to see orthopedist for hip/leg on Right.  some left leg pain as well.    no fevers, no chills, no heat or cold intolerance.  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    Pt's home BP are 130-142 systolic.  currently in pain.      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 EOMI. Perrla. Funduscopic exam benign, without hemorrhages, exudates or papilledema. Vision grossly normal. Nose:     External nasal examination shows no deformity or inflammation. Nasal mucosa are pink and moist without lesions or exudates. Mouth:     Oral mucosa and oropharynx without lesions or exudates.  Teeth in good repair. Neck:     No deformities, masses, or tenderness noted. Lungs:     Normal respiratory effort, chest expands symmetrically. Lungs are clear to  auscultation, no crackles or wheezes. Heart:     Normal rate and regular rhythm. S1 and S2 normal without gallop, murmur, click, rub or other extra sounds. Extremities:     pain with external rotation of R leg 20 degrees, no pain on L leg extension or external rotation. Neurologic:     alert & oriented X3 and gait normal.      Impression & Recommendations:  Problem # 1:  FATIGUE (ICD-780.79) Assessment: New hx IRON Def anemia.  will check cbc.   Orders: CBC w/Diff-[facility] (16109) Ferritin-[facility] (60454-09811) [facility]- Est  Level 4 (91478)   Problem # 2:  AVASCULAR NECROSIS, FEMORAL HEAD (ICD-733.42) Assessment: Deteriorated will see orthopedist soon.  refill oxycontin.   Orders: [facility]- Est  Level 4 (29562)   Problem # 3:  HYPERTENSION, BENIGN SYSTEMIC (ICD-401.1) Assessment: Unchanged will continue labetalol 300mg  pi two times a day.  pt to take hydralzzine once she sees othropedist for pain.  if sbp >140after 3 times will begin hydralazine.   Orders: [facility]- Est  Level 4 (13086)    Patient Instructions: 1)  Please schedule a follow-up appointment in 1 month. 2)  After seeing orthopedist,  cut labetalol 300mg  tab in half and take half tablet twice a day, and begin Hydralazine 10mg  three times a day.  If SBP > 150 after 1 week of being on hydralazine, increase to 2 tablets three times a day.    Laboratory Results   Blood Tests   Date/Time Recieved: December 11, 2006 9.12  AM  Date/Time Reported: December 11, 2006 2:13 PM    CBC WBC:  7.0   (Normal Range: 4.5-11.0) RBC:  3.53   (Normal Range 4.20-5.40) HGB:  10.6 g/dL   (Normal Range: 16.1-09.6 in Males, 12.0-15.0 in Females) Hct:  31.4 %   (Normal Range: 36.0-46.0) MCV:  89.1   (Normal Range: 80.0-100.0) Plt.:  345   (Normal Range: 150-450) % Lymph 21.6 # Lymph 1.5 % Mono 9.8 # Mono 0.7 # EO <0.7 # Baso <0.2 % Gran 68.6 # Gran 4.8 Comments: ...............test performed by......Marland KitchenBonnie A. [place], [place] (ASCP)

## 2010-07-24 NOTE — Progress Notes (Signed)
Summary: requesting to speak with MD  Phone Note Call from Patient Call back at [phone]   Reason for Call: Talk to Doctor Summary of Call: pt sts MD called her thursday re: her pressure and pt needs to speak with MD again Initial call taken by: ERIN LEVAN,  February 09, 2007 9:48 AM  Follow-up for Phone Call        called pt. pt only wanted to speak with dr.love. i asked her, if it is in reference to her prior authorization and also told her that it was faxed. i explained to pt that dr.love can not always call pt's back because she is either in clinic or at her hospital rotation. pt was upset, because she wanted dr.love to call her today.  pt will make appt with dr.love.  Follow-up by: Arlyss Repress CMA,,  February 09, 2007 10:13 AM

## 2010-07-24 NOTE — Progress Notes (Signed)
Summary: requesting test results  Phone Note Call from Patient Call back at [phone]   Reason for Call: Talk to Nurse Summary of Call: pt is requesting her results of her pap smear that was done a few weeks ago Initial call taken by: ERIN LEVAN,  November 27, 2006 9:38 AM  Follow-up for Phone Call        Gave message of "same as previous pap, continue paps every 6 months" per Dr. Sandria Manly Follow-up by: [name] CMA,  November 27, 2006 9:52 AM

## 2010-07-24 NOTE — Progress Notes (Signed)
Summary: Rx Req  Phone Note Refill Request Call back at Home Phone [phone] Message from:  Patient  Refills Requested: Medication #1:  VICODIN HP 10-660 MG TABS 1 tablet every 4-6 hrs  for pain. Initial call taken by: Clydell Hakim,  August 10, 2009 2:45 PM  Follow-up for Phone Call        okay to refill. #120 Follow-up by: Helane Rima DO,  August 10, 2009 7:50 PM    Prescriptions: VICODIN HP 10-660 MG TABS (HYDROCODONE-ACETAMINOPHEN) 1 tablet every 4-6 hrs  for pain.  #120 x 0   Entered by:   Theresia Lo RN   Authorized by:   Helane Rima DO   Signed by:   Theresia Lo RN on 08/11/2009   Method used:   Telephoned to ...       [facility]  [address] [phone]* (retail)       [address] [place], [place]  [address]       Ph: [phone] or [phone]       Fax: [phone]   RxID:   [id number]

## 2010-07-24 NOTE — Progress Notes (Signed)
Summary: Rx  Phone Note Refill Request Call back at Home Phone [phone]   Refills Requested: Medication #1:  VICODIN HP 10-660 MG TABS 1 tablet every 4-6 hrs  for pain. Initial call taken by: Knox Royalty,  May 23, 2010 10:27 AM  Follow-up for Phone Call        in to be called box for pick-up Follow-up by: Helane Rima DO,  May 25, 2010 8:16 AM    Prescriptions: VICODIN HP 10-660 MG TABS (HYDROCODONE-ACETAMINOPHEN) 1 tablet every 4-6 hrs  for pain.  #120 x 0   Entered and Authorized by:   Helane Rima DO   Signed by:   Helane Rima DO on 05/25/2010   Method used:   Print then Give to Patient   RxID:   [id number]

## 2010-07-24 NOTE — Progress Notes (Signed)
Summary: refill  Phone Note Refill Request   Refills Requested: Medication #1:  VICODIN HP 10-660 MG TABS 1 tablet every 4-6 hrs  for pain. [facility]  Initial call taken by: De Nurse,  Oct 26, 2008 3:26 PM  Follow-up for Phone Call        forwarded to pcp Follow-up by: Golden Circle RN,  Oct 26, 2008 3:51 PM  Additional Follow-up for Phone Call Additional follow up Details #1::        see following phone message.  vicodin refill authorized. Additional Follow-up by: Sylvan Cheese MD,  Oct 27, 2008 11:32 AM

## 2010-07-24 NOTE — Assessment & Plan Note (Signed)
Summary: pregnant, htn, lupus, pain meds   Vital Signs:  Patient Profile:   30 Years Old Female Weight:      180 pounds Pulse rate:   92 / minute BP sitting:   158 / 94  (left arm)  Pt. in pain?   no  Vitals Entered By: Arlyss Repress CMA, (August 10, 2007 3:37 PM)              Is Patient Diabetic? No     PCP:  MELISSA LOVE  MD  Chief Complaint:  f/up HTN. refill Vicodin.  History of Present Illness: pt took blood pressure at home last week and was noted to be 135/78.  Pt is out of bp meds today.   pt sent Rx to pharmacy and needs to pick it up.    Pt thinks she may be pregnant.  pt has been trying to get pregnant for approximately 1 year.  pt was expecting period 1 week ago.    pt has been well controlled on High Dose Vicodin for her chronic pain related to avascular necrosis of the hips.  Pt may now be moved to coming in for office visits every other month and picking up her Rx each month.    Lupus: pt has seen Rheum and has been on steroids. pt feels much better.  pt due to go back to rheum next month.       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 ; lives with 63  year old son; has a supportive mother who works at [employer], Veterinary surgeon, different FOB than her 58 yr old son.       Physical Exam  General:     Well-developed,well-nourished,in no  acute distress; alert,appropriate and cooperative throughout examination Lungs:     Normal respiratory effort, chest expands symmetrically. Lungs are clear to auscultation, no crackles or wheezes. Heart:     Normal rate and regular rhythm. S1 and S2 normal without gallop, murmur, click, rub or other extra sounds.    Impression & Recommendations:  Problem # 1:  AMENORRHEA (ICD-626.0) u preg postitive.  will refer to High Risk Ob for pregnancy with comorbidities of lupus, HTN, and avascular necrosis of the hips.  Orders: U Preg-[facility] (81025) [facility]- Est  Level 4 (16109)   Problem # 2:  AVASCULAR NECROSIS, FEMORAL HEAD (ICD-733.42) pain regimen stable.  ok to now go every other month with office visits.  pt to call 1 week before running out to pick up refill on vicodin.  next scheduled office visit appt will be in april.   Her updated medication list for this problem includes:    D 1000  1000 Unit Caps (Cholecalciferol) .Marland Kitchen... Take 1 capsule by mouth once a day  Orders: [facility]- Est  Level 4 (16109)   Problem # 3:  HYPERTENSION, BENIGN SYSTEMIC (ICD-401.1) not controlled in office today, however pt reports that it is stable at home.  pt also did not take her medications today because she ran out.  re-explained to pt that she needs to keep supply on hand, and to check BPs more frequently.  if ever >140/90 need to call to schedule appt to adjust medications.   Her updated medication list for this problem includes:    Hydralazine Hcl 50 Mg Tabs (Hydralazine hcl) .Marland Kitchen... 1 tablet four times a day for blood pressure.    Labetalol Hcl 300 Mg Tabs (Labetalol hcl) .Marland Kitchen... Take 1 tablet by mouth twice a day  Orders: [facility]- Est  Level 4 (60454)   Problem # 4:  SLE (ICD-710.0) stable.  keep appt with rheum for next month.  Her updated medication list for this problem includes:    Prednisone 20 Mg Tabs (Prednisone) .Marland Kitchen... Take 1 tablet by mouth once a day  Orders: [facility]- Est  Level 4 (99214)   Complete  Medication List: 1)  D 1000 1000 Unit Caps (Cholecalciferol) .... Take 1 capsule by mouth once a day 2)  Folic Acid 1 Mg Tabs (Folic acid) .... Take 1 tablet by mouth once a day 3)  Hydralazine Hcl 50 Mg Tabs (Hydralazine hcl) .Marland Kitchen.. 1 tablet four times a day for blood pressure. 4)  Labetalol Hcl 300 Mg Tabs (Labetalol hcl) .... Take 1 tablet by mouth twice a day 5)  Prednisone 20 Mg Tabs (Prednisone) .... Take 1 tablet by mouth once a day 6)  Vicodin Hp 10-660 Mg Tabs (Hydrocodone-acetaminophen) .Marland Kitchen.. 1 tablet every 4-6 hrs  for pain. 7)  Pre-natal Tabs (Prenatal multivit-min-fe-fa) .Marland Kitchen.. 1 tablet a day, may substitute for whatever is on medicaid formulary     Prescriptions: VICODIN HP 10-660 MG TABS (HYDROCODONE-ACETAMINOPHEN) 1 tablet every 4-6 hrs  for pain.  #120 x 0   Entered and Authorized by:   Wilhemina Bonito  MD   Signed by:   Wilhemina Bonito  MD on 08/10/2007   Method used:   Print then Give to Patient   RxID:   [id number]  ] Laboratory Results   Urine Tests  Date/Time Received: August 10, 2007 4:14 PM  Date/Time Reported: August 10, 2007 4:20 PM     Urine HCG: positive Comments: ...............test performed by ...............Marland KitchenDelores Pate-Gaddy, CMA

## 2010-07-24 NOTE — Progress Notes (Signed)
Summary: Requesting to speak with pcp  Phone Note Call from Patient Call back at Home Phone [phone]   Summary of Call: Pt states she thought her appt was today, not yesterday, and she states she is unable to wait until Friday to see pcp.  I told pt she wasn't in clinic again until then and she is asking to speak with Dr. Sandria Manly about this issue. Initial call taken by: Haydee Salter,  February 19, 2007 9:04 AM  Follow-up for Phone Call        no.  must make appt and must keep appt to discuss issues.  I am available on Friday of this week.   Follow-up by: Wilhemina Bonito  MD,  February 19, 2007 9:16 AM  Additional Follow-up for Phone Call Additional follow up Details #1::        above message left on pt's voicemail. advised to call back and we can schedule appointment with Dr. Sandria Manly tomorrow. Additional Follow-up by: Theresia Lo RN,  February 19, 2007 11:48 AM

## 2010-07-24 NOTE — Consult Note (Signed)
Summary: [facility]   Imported By: De Nurse 02/07/2009 16:06:31  _____________________________________________________________________  External Attachment:    Type:   Image     Comment:   External Document

## 2010-07-24 NOTE — Letter (Signed)
Summary: Out of Work  [facility]  [address]   [place], [place] [address]   Phone: [phone]  Fax: [phone]    Nov 06, 2007   Employee:  SHAVONTE ZHAO Arment    To Whom It May Concern:   For Medical reasons, please excuse the above named employee from work for the following dates:  11/06/07   If you need additional information, please feel free to contact our office.         Sincerely,    STEPHANIE ALM  MD

## 2010-07-24 NOTE — Miscellaneous (Signed)
Summary: Patient Summary  Clinical Lists Changes  Problems: Removed problem of PREGNANCY, NORMAL (ICD-V22.2) Removed problem of BREAST MASS, LEFT (ICD-611.72) Assessed PAP SMEAR, ABNORMAL, ASCUS as comment only - no high risk HPV detected.  Repeat pap smear due in 1 year. Assessed AVASCULAR NECROSIS, FEMORAL HEAD as comment only - due to chronic prednisone dose.  Followed by Dr. Charlett Blake in the past.  Takes vicodin infrequently prescribed by [facility]. Assessed SLE as comment only - maintained on prednisone by Dr. Phylliss Bob, but I understand he is on medical leave or retired now.  Previously on plaquenil, but off during recent pregnancy Her updated medication list for this problem includes:    Prednisone 10 Mg Tabs (Prednisone) .Marland Kitchen... 1 tablet by mouth daily- refills to come from dr. Phylliss Bob  Assessed HYPERTENSION, BENIGN SYSTEMIC as comment only - followed by [facility] high risk clinic during recent pregnancy.  has not been seen at [facility] recently for BP.  Unsure what meds she is on after recent pregnancy. Her updated medication list for this problem includes:    Hydralazine Hcl 50 Mg Tabs (Hydralazine hcl) .Marland Kitchen... 1 tablet four times a day for blood pressure.    Labetalol Hcl 300 Mg Tabs (Labetalol hcl) .Marland Kitchen... Take 1 tablet by mouth twice a day  Observations: Added new observation of PAST MED HX: 2006- avascular necrosis of hips, on chronic pain meds- stable dose of vicodin, followed by Dr. Charlett Blake  hx of iron def anemia HTN- diagnosed in 1999 with birth of 1st child Lupus diagnosed in 2003, on chronic prednisone prescribed by Dr. Phylliss Bob 1 elective abortion, 3 spontaneous abortions  (12/05/2008 11:17)      Past Medical History:    2006- avascular necrosis of hips, on chronic pain meds- stable dose of vicodin, followed by Dr. Charlett Blake     hx of iron def anemia    HTN- diagnosed in 1999 with birth of 1st child    Lupus diagnosed in 2003, on chronic prednisone prescribed by Dr. Phylliss Bob    1 elective abortion, 3  spontaneous abortions   Impression & Recommendations:  Problem # 1:  PAP SMEAR, ABNORMAL, ASCUS (ICD-795.01) no high risk HPV detected.  Repeat pap smear due 02/2009  Problem # 2:  AVASCULAR NECROSIS, FEMORAL HEAD (ICD-733.42) due to chronic prednisone dose.  Followed by Dr. Charlett Blake in the past.  Takes vicodin infrequently [facility] by [facility]  Problem # 3:  SLE (ICD-710.0) maintained on prednisone by Dr. Phylliss Bob, but I understand he is on medical leave or retired now.  Previously on plaquenil, but off during recent pregnancy Her updated medication list for this problem includes:    Prednisone 10 Mg Tabs (Prednisone) .Marland Kitchen... 1 tablet by mouth daily- refills to come from dr. Phylliss Bob  Problem # 4:  HYPERTENSION, BENIGN SYSTEMIC (ICD-401.1) followed by [place] high risk clinic during recent pregnancy.  has not been seen at [facility] recently for BP.  Unsure what meds she is on after recent pregnancy. Her updated medication list for this problem includes:    Hydralazine Hcl 50 Mg Tabs (Hydralazine hcl) .Marland Kitchen... 1 tablet four times a day for blood pressure.    Labetalol Hcl 300 Mg Tabs (Labetalol hcl) .Marland Kitchen... Take 1 tablet by mouth twice a day  Complete Medication List: 1)  Hydralazine Hcl 50 Mg Tabs (Hydralazine hcl) .Marland Kitchen.. 1 tablet four times a day for blood pressure. 2)  Labetalol Hcl 300 Mg Tabs (Labetalol hcl) .... Take 1 tablet by mouth twice a day 3)  Prednisone 10 Mg  Tabs (Prednisone) .Marland Kitchen.. 1 tablet by mouth daily- refills to come from dr. Phylliss Bob 4)  Vicodin Hp 10-660 Mg Tabs (Hydrocodone-acetaminophen) .Marland Kitchen.. 1 tablet every 4-6 hrs  for pain. 5)  Prenatal 19 Tabs (Prenatal vit-dss-fe fum-fa) .Marland Kitchen.. 1 tablet by mouth daily

## 2010-07-24 NOTE — Assessment & Plan Note (Signed)
Summary: meet new md    Vital Signs:  Patient Profile:   30 Years Old Female Height:     64.5 inches Weight:      178.0 pounds Temp:     97.5 degrees F Pulse rate:   69 / minute BP sitting:   148 / 88  Pt. in pain?   no  Vitals Entered By: Starleen Blue RN (January 28, 2008 4:05 PM)                  PCP:  Wilhemina Bonito  MD  Chief Complaint:  meet md.  History of Present Illness: Pt with h/o SLE diagnosed in 2004.  Stable on prednisone 10mg .  Followed by Dr. Phylliss Bob, but has not been to his office since [facility] 2008.  Previously on plaquenil, but stopped b/c trying to get pregnant.  Reports no lupus flare in 3-4 years.  Has occasional joint pains, but not life-limiting.  No fevers or rashes.    AVN- chronic AVN of hip.  Followed by Dr. Charlett Blake.  Says it was due to prednisone use and that it has healed itself and there was no need for surgery.  Take vicodin as needed for pain.  On good days she does not have to take any vicodin.  HTN- stable on labetalol 300mg  two times a day and hydralazine.  Rarely misses a dose.  Did not take hydralazine today b/c she needs to p/u the refill.  Not on ACEi b/c trying to get pregnant.  Pt also thinks she is due for pap smear soon b/c last pap was abnml.         Risk Factors:  Tobacco use:  never    Physical Exam  General:     NAD Lungs:     CTAB, nml effort Heart:     RRR no murmur Psych:     flat affect    Impression & Recommendations:  Problem # 1:  SLE (ICD-710.0) Assessment: Unchanged Asked pt to make f/u appt with Dr. Phylliss Bob.  Plaquenil is pregnancy class D, however, it is frequently prescribed during pregnancy.  Will leave decision up to rheumatologist as to whether to start plaquenil given pt is trying to get pregnant. Her updated medication list for this problem includes:    Prednisone 10 Mg Tabs (Prednisone)  Orders: Basic Met-[facility] ([address]454) [facility]- Est  Level 4 (09811)   Problem # 2:  AVASCULAR NECROSIS,  FEMORAL HEAD (ICD-733.42) Vicodin as needed.  Problem # 3:  HYPERTENSION, BENIGN SYSTEMIC (ICD-401.1) Assessment: Unchanged Uncontrolled today, but pt has not taken hydralazine today.  Needs to go p/u refill. Her updated medication list for this problem includes:    Hydralazine Hcl 50 Mg Tabs (Hydralazine hcl) .Marland Kitchen... 1 tablet four times a day for blood pressure.    Labetalol Hcl 300 Mg Tabs (Labetalol hcl) .Marland Kitchen... Take 1 tablet by mouth twice a day  Orders: Basic Met-[facility] ([address]1478-29562) [facility]- Est  Level 4 (13086)   Problem # 4:  ABNORMAL PAP SMEAR, LGSIL (ICD-795.09) pt advised to make appt for pap smear.  LGSIL in 1/09.  Complete Medication List: 1)  Hydralazine Hcl 50 Mg Tabs (Hydralazine hcl) .Marland Kitchen.. 1 tablet four times a day for blood pressure. 2)  Labetalol Hcl 300 Mg Tabs (Labetalol hcl) .... Take 1 tablet by mouth twice a day 3)  Prednisone 10 Mg Tabs (Prednisone) 4)  Vicodin Hp 10-660 Mg Tabs (Hydrocodone-acetaminophen) .Marland Kitchen.. 1 tablet every 4-6 hrs  for pain.   Patient  Instructions: 1)  Make an appointment with Dr. Yetta Barre for a pap smear. 2)  Call Dr. Phylliss Bob for a follow-up appointment. 3)  Make sure you are taking your labetalol and hydralazine every day because your blood pressure is too high today.   ]

## 2010-07-24 NOTE — Progress Notes (Signed)
Summary: Rx Prob  Phone Note Call from Patient Call back at Home Phone [phone]   Caller: Patient Summary of Call: Pt says that the rx for her was not at the pharmacy.  [facility].   Initial call taken by: Clydell Hakim,  September 13, 2009 10:11 AM  Follow-up for Phone Call        message left to call back. Follow-up by: Theresia Lo RN,  September 13, 2009 12:31 PM  Additional Follow-up for Phone Call Additional follow up Details #1::        pt returned call from [place]. Additional Follow-up by: Clydell Hakim,  September 13, 2009 4:00 PM    Additional Follow-up for Phone Call Additional follow up Details #2::    called pharmacy and they did not receive rx called on 09/08/2009. verbally gave Rx for vicodin HP again. patient notified. Follow-up by: Theresia Lo RN,  September 13, 2009 4:24 PM

## 2010-07-24 NOTE — Progress Notes (Signed)
Summary: RX called in per pt's request/ts  Phone Note Call from Patient Call back at Home Phone [phone]   Reason for Call: Talk to Nurse Summary of Call: pt says someone left her a vm that her vicodin is ready for p/u but she said she normally does not have to pick up, wants [place] to call it in to the pharmacy, [facility] Initial call taken by: Knox Royalty,  September 08, 2009 8:45 AM  Follow-up for Phone Call        called rx to pt's pharmacy. removed original rx form the pick up box from up front. Follow-up by: Arlyss Repress CMA,,  September 08, 2009 10:58 AM

## 2010-07-24 NOTE — Progress Notes (Signed)
Summary: [place] request  Phone Note Call from Patient Call back at Home Phone [phone]   Reason for Call: Talk to Nurse Summary of Call: Pt states she had a preg test done yesterday and it came back negative, but she says she is feeling awful and would like to speak with her dr about this issue.  Initial call taken by: Haydee Salter,  February 10, 2007 9:15 AM  Follow-up for Phone Call        Period is 3 days late. states she has started spotting last night. no longer nauseous. Not desiring pregnancy. Feels bad but unable to name specific symptoms. Has appt with PCP next Wed. Wants to talk to PCP. message sent to md Follow-up by: Golden Circle RN,  February 10, 2007 9:28 AM  Additional Follow-up for Phone Call Additional follow up Details #1::        I am unable to talk to this patient at this time.  If symptoms are severe, then have her schedule an appt with [place] doctor.  If this is regarding Medicaid denial of oxycontin, will discuss at appt only with PCP.  I have tried to contact this patient on multiple occasions and not available.  Will only discuss issues at office visit for now.   Additional Follow-up by: Wilhemina Bonito  MD,  February 10, 2007 12:20 PM    Additional Follow-up for Phone Call Additional follow up Details #2::    left message Follow-up by: Golden Circle RN,  February 11, 2007 12:07 PM  Additional Follow-up for Phone Call Additional follow up Details #3:: Details for Additional Follow-up Action Taken: " I think I can wait until Thursday" declined work in appt Additional Follow-up by: Golden Circle RN,  February 11, 2007 12:30 PM

## 2010-07-24 NOTE — Assessment & Plan Note (Signed)
Summary: HTN, infertility, pain   Vital Signs:  Patient Profile:   30 Years Old Female Weight:      168 pounds Pulse rate:   72 / minute BP sitting:   179 / 111  Vitals Entered By: Lillia Pauls CMA (April 06, 2007 8:52 AM)                 Serial Vital Signs/Assessments:  Time      Position  BP       Pulse  Resp  Temp     By 8:52 AM   Lying RA  170/111  70                    NEETON MOORE CMA   Chief Complaint:  FU BP and infertility..  History of Present Illness: Pt is here today for follow up Hypertension.    HTN: poorly controlled since being off ace inhibitor.  Using medications: labetalol 300mg  two times a day and hydralazine 10mg  three times a day.   Chest Pain: no SOB:no HA:no  Leg Edema:no  Previous hx of CVA/TIA: no  chronic Pain from avascular necrosis- pt likes the vicodin, higher dose.  not as drowsy.  pt quit working.  Pain levels changes with weather, with colder weather, she aches a little bit more.  But otherwise doing better.    infertility: Pt has been using over the [facility].  pt has been ovulating.  usually ovualated around day 12 and had appropriate temp change, had sex daily for next 3-4 days, and still not pregnant.  pt has now been trying for approx 1 year. This current partner is a different partner than before with her previous child.  pt states that she has never had a history that she is aware of PID.  No known history of GC/Chlam/ Trich.   Pt ovulates about every 28 days.  currently on day 4 of menses.       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 Lungs are clear to auscultation, no crackles or wheezes. Heart:     Normal rate and regular rhythm. S1 and S2 normal without gallop, murmur, click, rub or other extra sounds.    Impression & Recommendations:  Problem # 1:  HYPERTENSION, BENIGN SYSTEMIC (ICD-401.1) Assessment: Deteriorated will increase hydralazine to 25mg  by mouth  4 times a day.  continue labetalol.  will need to max out hydralazine (max 50mg  4 times a day) and then consider adding aldomet.  need pt to get in with Dr. Shawnie Pons to have preconception counseling and help with BP in this pt that was well controlled on ACE inhibitor but still wanting to get pregnant.   Her updated medication list for this problem includes:    Hydralazine Hcl 25 Mg Tabs (Hydralazine hcl) .Marland Kitchen... 1 tablet 4 times a day for blood pressure in addition to labetalol.    Labetalol Hcl 300 Mg Tabs (Labetalol hcl) .Marland Kitchen... Take 1 tablet by mouth twice a day  Orders: [facility]- Est  Level 4 ([address])   Problem # 2:  INFERTILITY, FEMALE NOS (ICD-628.9) Assessment: Unchanged will refer to Dr. Shawnie Pons either here at [facility] clinic or at [facility] if no appt available this month.  SPoke with Dr. Shawnie Pons regarding infertility workup.  pt without hx of PID.  has a different partner than her prevous FOB.  Pt is ovulating.  Pt will be due for menses on Nov 7th.  Pt is seeing Dr. Shawnie Pons prior to her menses.  We discussed the possiblity of HSG on 5th day of menses.  Pt will see Dr. Shawnie Pons prior to  this and can discuss options and arrange for this study if pt still wants to progress with infertility workup after speaking with OB.   [facility]- Est  Level 4 ([address])   Problem # 3:  AVASCULAR NECROSIS, FEMORAL HEAD (ICD-733.42) continue vicodin HS.  working well.   Her updated medication list for this problem includes:    D 1000 1000 Unit Caps (Cholecalciferol) .Marland Kitchen... Take 1 capsule by mouth once a day  Orders: [facility]- Est  Level 4 ([address])   Problem # 4:  SLE (ICD-710.0) follow up with Rheum.   The following medications were removed from the medication list:    Ibuprofen 800 Mg Tabs (Ibuprofen) .Marland Kitchen... Take 1 tablet by mouth every eight hours    Plaquenil 200 Mg Tabs (Hydroxychloroquine sulfate) .Marland Kitchen... 2 tablet by mouth once a day  Her updated medication list for this problem includes:    Prednisone 20 Mg Tabs (Prednisone) .Marland Kitchen... Take 1 tablet by mouth once a day  Orders: Comp Met-[facility] ([id number]) CBC-[facility] ([address]) [facility]- Est  Level 4 ([address])   Complete Medication List: 1)  D 1000 1000 Unit Caps (Cholecalciferol) .... Take 1 capsule by mouth once a day 2)  Folic Acid 1 Mg Tabs (Folic acid) .... Take 1 tablet by mouth once a day 3)  Hydralazine Hcl 25 Mg Tabs (Hydralazine hcl) .Marland Kitchen.. 1 tablet 4 times a day for blood pressure in addition to labetalol. 4)  Labetalol Hcl 300 Mg Tabs (Labetalol hcl) .... Take 1 tablet by mouth twice a day 5)  Prednisone 20 Mg Tabs (Prednisone) .... Take 1 tablet by mouth once a day 6)  Vicodin Hp 10-660 Mg Tabs (Hydrocodone-acetaminophen) .Marland Kitchen.. 1 tablet every 4-6 hrs  for pain.   Patient Instructions: 1)  Please schedule a follow-up appointment in 1 month. 2)  Please schedule pt to be seen in OB clinic with DR PRATT at next available appt.  If unable then please let Arelia Longest know so that he can arrange pt to be seen at [facility] [phone], for problems getting pregnant and LUPUS.  3)  please schedule appt with Dr. Phylliss Bob.  - to discuss prednisone dosage and folic acid.   4)   [id number] CELL    Prescriptions:  VICODIN HP 10-660 MG TABS (HYDROCODONE-ACETAMINOPHEN) 1 tablet every 4-6 hrs  for pain.  #120 x 0   Entered and Authorized by:   Wilhemina Bonito  MD   Signed by:   Wilhemina Bonito  MD on 04/06/2007   Method used:   Print then Give to Patient   RxID:   [id number] HYDRALAZINE HCL 25 MG  TABS (HYDRALAZINE HCL) 1 tablet 4 times a day for blood pressure in addition to labetalol.  #120 x 6   Entered and Authorized by:   Wilhemina Bonito  MD   Signed by:   Wilhemina Bonito  MD on 04/06/2007   Method used:   Print then Give to Patient   RxID:   [id number] VICODIN HP 10-660 MG TABS (HYDROCODONE-ACETAMINOPHEN) 1 tablet every 4-6 hrs  for pain.  #120 x 0   Entered and Authorized by:   Wilhemina Bonito  MD   Signed by:   Wilhemina Bonito  MD on 04/06/2007   Method used:   Historical   RxID:   [id number]  ]

## 2010-07-24 NOTE — Assessment & Plan Note (Signed)
Summary: cpe,df   Vital Signs:  Patient profile:   30 year old female Height:      64.5 inches Weight:      166 pounds BMI:     28.16 Temp:     98.1 degrees F oral Pulse rate:   72 / minute BP sitting:   134 / 81  (left arm) Cuff size:   regular  Vitals Entered By: Tessie Fass CMA (October 02, 2009 4:09 PM) CC: complete physical Is Patient Diabetic? No Pain Assessment Patient in pain? no        Primary Care Provider:  Helane Rima DO  CC:  complete physical.  History of Present Illness: Angela Walls is a 30 year old AAF with lupus and bilateral avascular necrosis presenting today for CPE.  1. HTN: Taking Labetalol, HCTZ with no problems. Denies CP, SOB, N/V/D, HA, dizziness.  2. Lupus: followed by Dr. Azzie Roup.  Rx prednisone. Doing well.  3. Avascular necrosis of hips: Chronic right hip pain, followed by Dr. Charlett Blake. Pain controlled on current regimen.       Habits & Providers  Alcohol-Tobacco-Diet     Tobacco Status: never  Current Medications (verified): 1)  Labetalol Hcl 300 Mg Tabs (Labetalol Hcl) .... Take 1 Tablet By Mouth Twice A Day 2)  Plaquenil 200 Mg Tabs (Hydroxychloroquine Sulfate) .... One By Mouth Daily 3)  Vicodin Hp 10-660 Mg Tabs (Hydrocodone-Acetaminophen) .Marland Kitchen.. 1 Tablet Every 4-6 Hrs  For Pain. 4)  Prenatal 19  Tabs (Prenatal Vit-Dss-Fe Fum-Fa) .Marland Kitchen.. 1 Tablet By Mouth Daily 5)  Hydrochlorothiazide 25 Mg Tabs (Hydrochlorothiazide) .Marland Kitchen.. 1 Tablet By Mouth Daily For Blood Pressure  Allergies (verified): No Known Drug Allergies  Past History:  Past medical, surgical, family and social histories (including risk factors) reviewed for relevance to current acute and chronic problems.  Past Medical History: Reviewed history from 05/08/2009 and no changes required. Avascular necrosis of hips, Dx 2006, on chronic pain meds with stable dose of vicodin, followed by Dr. Charlett Blake  Hx of iron deficiency anemia HTN Lupus, Dx 2003, followed by Dr.  Dareen Piano [id number]- 1 vaginal birth (1999) and 1 c/s (2010).  Past Surgical History: Reviewed history from 05/08/2009 and no changes required. Appendix, 2000 LTCS due to NRFHT, May 2010  Family History: Reviewed history from 08/02/2009 and no changes required. Father - brain aneurysm Mother - anemia Brothers and Sisters - healthy Sons - healthy  Social History: Reviewed history from 05/08/2009 and no changes required. Lives with son Lanny Cramp) and newborn baby Nurse, learning disability). Has a supportive mother, fiance Margaretmary Eddy different FOB than her 10 yr old son. Stay-at-home mom. No alcohol, tobacco, or drugs.  Review of Systems  The patient denies fever, chest pain, dyspnea on exertion, peripheral edema, prolonged cough, headaches, abdominal pain, difficulty walking, depression, abnormal bleeding, and breast masses.    Physical Exam  General:  Well-developed, well-nourished, in no acute distress; alert, appropriate and cooperative throughout examination. Vitals reviewed. Head:  Normocephalic and atraumatic without obvious abnormalities.  Eyes:  No corneal or conjunctival inflammation noted. EOMI. Perrla. Vision grossly normal. Ears:  R ear normal and L ear normal.   Mouth:  Oral mucosa and oropharynx without lesions or exudates.   Neck:  No deformities, masses, or tenderness noted. Lungs:  Normal respiratory effort, chest expands symmetrically. Lungs are clear to auscultation, no crackles or wheezes. Heart:  Normal rate and regular rhythm. S1 and S2 normal without gallop, murmur, click, rub or other extra sounds. Abdomen:  Bowel sounds positive,abdomen soft and non-tender without masses, organomegaly or hernias noted. Msk:  Deferred since she will be seen at Orthopedic office this week. Pulses:  R and L dorsalis pedis and posterior tibial pulses are full and equal bilaterally. Extremities:  No edema. Neurologic:  Alert & oriented X 3, gait normal.   Skin:  Multiple hypopigmented  areas on face. Psych:  Normally interactive, good eye contact, and not anxious appearing.     Impression & Recommendations:  Problem # 1:  Sports physical (other med exam for admin purpose) (ICD-V70.3) Assessment Unchanged  Problem # 2:  HYPERTENSION, BENIGN SYSTEMIC (ICD-401.1) Assessment: Unchanged  Her updated medication list for this problem includes:    Labetalol Hcl 300 Mg Tabs (Labetalol hcl) .Marland Kitchen... Take 1 tablet by mouth twice a day    Hydrochlorothiazide 25 Mg Tabs (Hydrochlorothiazide) .Marland Kitchen... 1 tablet by mouth daily for blood pressure  Orders: Comp Met-[facility] ([id number]) [facility] - Est  18-39 yrs ([address]11)  Problem # 3:  SLE (ICD-710.0) Assessment: Unchanged  Her updated medication list for this problem includes:    Plaquenil 200 Mg Tabs (Hydroxychloroquine sulfate) ..... One by mouth daily  Orders: Comp Met-[facility] [phone]) [facility] - Est  18-39 yrs ([address])  Problem # 4:  AVASCULAR NECROSIS, FEMORAL HEAD (ICD-733.42) Assessment: Unchanged  Continue current pain regimen. Has appt to see Ortho this week. Due for XRAY per patient.  Orders: [facility] - Est  18-39 yrs ([address])  Problem # 5:  CONTRACEPTIVE MANAGEMENT (ICD-V25.09) Assessment: Unchanged  Orders: U Preg-[facility] (96[address]) Depo-Provera 150mg  (J1055)  Complete Medication List: 1)  Labetalol Hcl 300 Mg Tabs (Labetalol hcl) .... Take 1 tablet by mouth twice a day 2)  Plaquenil 200 Mg Tabs (Hydroxychloroquine sulfate) .... One by mouth daily 3)  Vicodin Hp 10-660 Mg Tabs (Hydrocodone-acetaminophen) .Marland Kitchen.. 1 tablet every 4-6 hrs  for pain. 4)  Prenatal 19 Tabs (Prenatal vit-dss-fe fum-fa) .Marland Kitchen.. 1 tablet by mouth daily 5)  Hydrochlorothiazide 25 Mg Tabs (Hydrochlorothiazide) .Marland Kitchen.. 1 tablet by mouth daily for blood pressure  Other Orders: CBC-[facility] (28413)  Patient Instructions: 1)  It was nice to see you today!  Laboratory Results   Urine Tests  Date/Time Received: October 02, 2009 4:32 PM  Date/Time Reported: October 02, 2009  4:42 PM     Urine HCG: negative Comments: ...............test performed by......Marland KitchenBonnie A. [place], MLS (ASCP)cm       Medication Administration  Injection # 1:    Medication: Depo-Provera 150mg     Diagnosis: CONTRACEPTIVE MANAGEMENT (ICD-V25.09)    Route: IM    Site: L deltoid    Exp Date: 04/2012    Lot #: [id number]    Mfr: [email or web address]    Comments: next depo due 6/27 through 7/11    Patient tolerated injection without complications    Given by: Tessie Fass CMA (October 02, 2009 4:54 PM)  Orders Added: 1)  U Preg-[facility] [81025] 2)  Comp Met-[facility] [[id number]] 3)  CBC-[facility] [85027] 4)  Depo-Provera 150mg  [J1055] 5)  [facility] - Est  18-39 yrs [99395]   Appended Document: cpe,df     Allergies: No Known Drug Allergies   Impression & Recommendations:  Problem # 1:  ROUTINE GENERAL MEDICAL EXAM@HEALTH  CARE FACL (ICD-V70.0)  Complete Medication List: 1)  Labetalol Hcl 300 Mg Tabs (Labetalol hcl) .... Take 1 tablet by mouth twice a day 2)  Plaquenil 200 Mg Tabs (Hydroxychloroquine sulfate) .... One by mouth daily 3)  Vicodin Hp 10-660 Mg Tabs (Hydrocodone-acetaminophen) .Marland Kitchen.. 1 tablet every 4-6 hrs  for pain. 4)  Prenatal 19 Tabs (Prenatal vit-dss-fe fum-fa) .Marland Kitchen.. 1 tablet by mouth daily 5)  Hydrochlorothiazide 25 Mg Tabs (Hydrochlorothiazide) .Marland Kitchen.. 1 tablet by mouth daily for blood pressure

## 2010-07-24 NOTE — Progress Notes (Signed)
Summary: refill  Phone Note Refill Request Call back at Home Phone [phone] Message from:  Patient  Refills Requested: Medication #1:  VICODIN HP 10-660 MG TABS 1 tablet every 4-6 hrs  for pain. Initial call taken by: De Nurse,  December 12, 2009 8:56 AM  Follow-up for Phone Call        called pt. pt request rx to be faxed to her pharmacy. done. Follow-up by: Arlyss Repress CMA,,  December 12, 2009 1:51 PM    Prescriptions: VICODIN HP 10-660 MG TABS (HYDROCODONE-ACETAMINOPHEN) 1 tablet every 4-6 hrs  for pain.  #120 x 2   Entered and Authorized by:   Helane Rima DO   Signed by:   Helane Rima DO on 12/12/2009   Method used:   Print then Give to Patient   RxID:   [id number]

## 2010-07-24 NOTE — Consult Note (Signed)
Summary: [facility]   Imported By: Alphonzo Lemmings KIVETT 06/11/2007 16:30:15  _____________________________________________________________________  External Attachment:    Type:   Image     Comment:   External Document

## 2010-07-24 NOTE — Letter (Signed)
Summary: Generic Letter  [name]  [address]   [place], [place] [address]   Phone: [phone]  Fax: [phone]    03/08/2007  [facility] [address] [place], [place]  [address]  Dear Ms. Angela Walls, your labwork is as follows: Sodium                    142 mEq/L                   135-145   Potassium                 3.7 mEq/L                   3.5-5.3   Chloride                  109 mEq/L                   96-112   CO2                       24 mEq/L                    19-32   Glucose                   85 mg/dL                    57-84   BUN                       15 mg/dL                    6-96   Creatinine                1.04 mg/dL                  0.40-1.20   Calcium                   9.0 mg/dL                   2.9-52.8  Your kidney function went from 0.9 to 1.04 only a mild increase so which is still within normal range.  I would still stop using ibuprofen for now, and if you need to use it to help with pain limit it to NO MORE than 800mg  3 times a day WITH FOOD.  Otherwise, I will see you within one month to followup on your pain management.  Please see your orthopedist between now and then.  If you have any other concerns, please do not hesitate to call and leave a message with my nurse, I will return your call as soon as possible.  It was good seeing you.  Hope you have a good month!   Sincerely,   Angela LOVE  MD [name]  Appended Document: Generic Letter mailed letter to patient

## 2010-07-24 NOTE — Progress Notes (Signed)
Summary: refill  Phone Note Call from Patient Call back at Home Phone [phone]   Caller: Patient Summary of Call: needs refill Vicodin [facility] [address] Initial call taken by: De Nurse,  August 03, 2008 1:59 PM      Prescriptions: VICODIN HP 10-660 MG TABS (HYDROCODONE-ACETAMINOPHEN) 1 tablet every 4-6 hrs  for pain.  #120 x 2   Entered and Authorized by:   Sylvan Cheese MD   Signed by:   Sylvan Cheese MD on 08/03/2008   Method used:   Printed then faxed to ...       [facility]  [address] [phone]* (retail)       [address] [place], [place]  [address]       Ph: [phone] or [phone]       Fax: [phone]   RxID:   [id number]   Appended Document: refill we sent rx to the wrong pharmacy, [facility] [address].  Appended Document: refill message left on voicemail that she can call pharmacy and ask them to have RX transferred.  Appended Document: [facility]ill [facility] and [facility] [address]. patient notified.

## 2010-07-24 NOTE — Assessment & Plan Note (Signed)
Summary: pain in hip,tcb   Vital Signs:  Patient profile:   30 year old female Height:      64.5 inches Weight:      180.31 pounds BMI:     30.58 Temp:     97.9 degrees F oral Pulse rate:   83 / minute Pulse rhythm:   regular BP sitting:   121 / 79  (right arm)  Vitals Entered By: Modesta Messing LPN (February 06, 2009 9:56 AM) CC: c/o right hip pain. Is Patient Diabetic? No Pain Assessment Patient in pain? yes     Location: Right hip Intensity: 5 Type: aching Onset of pain  Chronic   Primary Care Provider:  Helane Rima DO  CC:  c/o right hip pain.Marland Kitchen  History of Present Illness: Angela Walls is a 30 year old AAF with lupus and bilateral avascular necrosis presenting today for follow up of pain, requesting refill of Vicodin.  1. Lupus: previously followed by Dr. Phylliss Bob who is retiring in August.  Will be followed by Dr. Azzie Roup.  Hopes to go back on Plaquenil and come off Prednisone.   2. Avascular necrosis of hips: followed by Dr. Judson Roch, has appointment today. She is taking Diclofenac (prescribed by Dr. Judson Roch) 75 mg daily for inflammation and vicodin (prescribed here) 3-4 days per week for pain mostly in right hip. They will be getting more xrays today. Laiba endorses more right hip pain lately since she has recently returned back to work (daycare).  3. HTN: Taking Labetalol, HCTZ with no problems. Denies CP, SOB, N/V/D, HA, dizziness.    Current Medications (verified): 1)  Labetalol Hcl 300 Mg Tabs (Labetalol Hcl) .... Take 1 Tablet By Mouth Twice A Day 2)  Prednisone 10 Mg  Tabs (Prednisone) .Marland Kitchen.. 1 Tablet By Mouth Daily- Refills To Come From Dr. Phylliss Bob 3)  Vicodin Hp 10-660 Mg Tabs (Hydrocodone-Acetaminophen) .Marland Kitchen.. 1 Tablet Every 4-6 Hrs  For Pain. 4)  Prenatal 19  Tabs (Prenatal Vit-Dss-Fe Fum-Fa) .Marland Kitchen.. 1 Tablet By Mouth Daily 5)  Hydrochlorothiazide 25 Mg Tabs (Hydrochlorothiazide) .Marland Kitchen.. 1 Tablet By Mouth Daily For Blood Pressure 6)  Diclofenac Sodium 75 Mg  Tbec (Diclofenac Sodium) .Marland Kitchen.. 1 By Mouth Daily. Take With Food.  Allergies (verified): No Known Drug Allergies PMH-FH-SH reviewed for relevance  Review of Systems       per HPI, otherwise negative  Physical Exam  General:  Well-developed,well-nourished, in no acute distress; alert,appropriate and cooperative throughout examination. Vital signs reviewed. Lungs:  Normal respiratory effort, chest expands symmetrically. Lungs are clear to auscultation, no crackles or wheezes. Heart:  Normal rate and regular rhythm. S1 and S2 normal without gallop, murmur, click, rub or other extra sounds. Msk:  Deferred since she will be seen at Orthopedic office today.   Impression & Recommendations:  Problem # 1:  HIP PAIN, RIGHT, CHRONIC (ICD-719.45) Assessment Unchanged Will continue to refill medication at this time. The patient has an appointment with Dr. Judson Roch today at 4 pm. She will request notes to be sent to be me. She will be getting another xray today. Will monitor. Her updated medication list for this problem includes:    Vicodin Hp 10-660 Mg Tabs (Hydrocodone-acetaminophen) .Marland Kitchen... 1 tablet every 4-6 hrs  for pain.    Diclofenac Sodium 75 Mg Tbec (Diclofenac sodium) .Marland Kitchen... 1 by mouth daily. take with food.  Orders: [facility]- Est Level  3 ([address]109)  Problem # 2:  HYPERTENSION, BENIGN SYSTEMIC (ICD-401.1) Assessment: Unchanged Continue current treatment. Her updated medication list for  this problem includes:    Labetalol Hcl 300 Mg Tabs (Labetalol hcl) .Marland Kitchen... Take 1 tablet by mouth twice a day    Hydrochlorothiazide 25 Mg Tabs (Hydrochlorothiazide) .Marland Kitchen... 1 tablet by mouth daily for blood pressure  Orders: [facility]- Est Level  3 (99213)  Complete Medication List: 1)  Labetalol Hcl 300 Mg Tabs (Labetalol hcl) .... Take 1 tablet by mouth twice a day 2)  Prednisone 10 Mg Tabs (Prednisone) .Marland Kitchen.. 1 tablet by mouth daily- refills to come from dr. Phylliss Bob 3)  Vicodin Hp 10-660 Mg Tabs  (Hydrocodone-acetaminophen) .Marland Kitchen.. 1 tablet every 4-6 hrs  for pain. 4)  Prenatal 19 Tabs (Prenatal vit-dss-fe fum-fa) .Marland Kitchen.. 1 tablet by mouth daily 5)  Hydrochlorothiazide 25 Mg Tabs (Hydrochlorothiazide) .Marland Kitchen.. 1 tablet by mouth daily for blood pressure 6)  Diclofenac Sodium 75 Mg Tbec (Diclofenac sodium) .Marland Kitchen.. 1 by mouth daily. take with food.  Patient Instructions: 1)  It was nice to see you today. 2)  I will continue to refill your Vicodin. 3)  Please have your orthopedic doctor send notes to me. Prescriptions: DICLOFENAC SODIUM 75 MG TBEC (DICLOFENAC SODIUM) 1 by mouth daily. take with food.  #30 x 0   Entered and Authorized by:   Helane Rima MD   Signed by:   Helane Rima MD on 02/06/2009   Method used:   Historical   RxID:   [id number] VICODIN HP 10-660 MG TABS (HYDROCODONE-ACETAMINOPHEN) 1 tablet every 4-6 hrs  for pain.  #120 x 2   Entered and Authorized by:   Helane Rima MD   Signed by:   Helane Rima MD on 02/06/2009   Method used:   Printed then faxed to ...       [facility] [phone]* (retail)       [address] [place], [place]  [address]       Ph: [phone] or [phone]       Fax: [phone]   RxID:   [id number]

## 2010-07-24 NOTE — Progress Notes (Signed)
Summary: Rx  Phone Note Refill Request Call back at Home Phone [phone] Message from:  Patient  Refills Requested: Medication #1:  HYDRALAZINE HCL 50 MG  TABS 1 tablet four times a day for blood pressure. [facility] [address]  states pharmacy told her that md denied refill stating OB dr needed to fill, but pt states OB office told her that they wouldn't fill since she sees a different dr there everytime and that her primary md would need to refill.  Initial call taken by: Haydee Salter,  August 15, 2008 11:54 AM  Follow-up for Phone Call        will send message to MD. Follow-up by: Theresia Lo RN,  August 15, 2008 12:24 PM  Additional Follow-up for Phone Call Additional follow up Details #1::        Larita Fife, can you please call [facility] and ask that they write her BP medications prescriptions while she is pregnant.  Pt is followed there for her pregnancy since she has lupus and HTN.  They are checking her BPs and monitoring her pregnancy.  I have not seen her since September.  I will be happy to take over the monitoring of her BP and medications after she delivers. Additional Follow-up by: Sylvan Cheese MD,  August 15, 2008 12:27 PM    Additional Follow-up for Phone Call Additional follow up Details #2::    contacted nurse at [facility] and she will check on this and call us back. Follow-up by: Theresia Lo RN,  August 15, 2008 2:03 PM  Additional Follow-up for Phone Call Additional follow up Details #3:: Details for Additional Follow-up Action Taken: Diane from [facility]  states they see no note that patient has contacted them about this. she will contact Doctor on call and will take care of refilling. she will contact patient. Additional Follow-up by: Theresia Lo RN,  August 15, 2008 2:09 PM

## 2010-07-24 NOTE — Progress Notes (Signed)
Summary: Rx Req  Phone Note Refill Request Call back at Home Phone [phone] Message from:  Patient  Refills Requested: Medication #1:  VICODIN HP 10-660 MG TABS 1 tablet every 4-6 hrs  for pain. PLEASE CALL PT WHEN READY TO PICK UP.  Initial call taken by: Clydell Hakim,  January 04, 2009 2:52 PM  Follow-up for Phone Call        will sned message to MD. Follow-up by: Theresia Lo RN,  January 04, 2009 5:04 PM    Prescriptions: VICODIN HP 10-660 MG TABS (HYDROCODONE-ACETAMINOPHEN) 1 tablet every 4-6 hrs  for pain.  #120 x 2   Entered and Authorized by:   Helane Rima MD   Signed by:   Helane Rima MD on 01/05/2009   Method used:   Print then Give to Patient   RxID:   [id number]  Ready for pick-up.  Appended Document: Rx Req lVM for pt to pick up script.  Appended Document: Rx Req called to [facility] [address] & destroyed original

## 2010-07-24 NOTE — Progress Notes (Signed)
Summary: Requesting referral  Phone Note Call from Patient Call back at Work Phone [phone]   Reason for Call: Talk to Nurse, Referral Summary of Call: pt sts she had a positive pregnancy test, wants to know if we can [facility]? Initial call taken by: ERIN LEVAN,  August 11, 2007 8:40 AM  Follow-up for Phone Call        Pt states Dr. Sandria Manly said she would be referred to [facility].  Will foward to Dr. Sandria Manly to make sure she is supposed to have her care transferred to [facility] for her pregnancy. Follow-up by: AMY MARTIN RN,  August 11, 2007 10:14 AM  Additional Follow-up for Phone Call Additional follow up Details #1::        yes, please refer to [facility].  Pt has HTN, Lupus, and avascular necrosis of the hips, and I was told by Dr. Shawnie Pons, that WHEN she got pregnant, that she would need to go there.  Thanks.  And tell her CONGRATS!!!  She's been trying for 1 year to get pregnant!! Additional Follow-up by: Wilhemina Bonito  MD,  August 11, 2007 11:25 AM    Additional Follow-up for Phone Call Additional follow up Details #2::    faxed referral info to [facility] - they will schedule pt for appt and contact us with pt's appt. Follow-up by: AMY MARTIN RN,  August 11, 2007 12:00 PM       Appended Document: Requesting referral pt is calling re: referral....[facility] never received referral....Marland Kitchenneeds to go to [place]....ERIN LEVAN.....08/12/07....4:08.  Appended Document: Requesting referral Phone call to pt- advised her of her appt 08/20/07 at 8:45am at [facility].

## 2010-07-24 NOTE — Consult Note (Signed)
Summary: [facility]   Imported By: Clydell Hakim 02/22/2009 10:20:12  _____________________________________________________________________  External Attachment:    Type:   Image     Comment:   External Document

## 2010-07-24 NOTE — Progress Notes (Signed)
Summary: refill  Phone Note Call from Patient Call back at [phone]   Reason for Call: Refill Medication Summary of Call: pt is requesting refill on her vicodin Initial call taken by: Knox Royalty,  December 09, 2007 8:30 AM  Follow-up for Phone Call        lmom to call back and let us know what she is using this med for. Follow-up by: Alphia Kava,  December 09, 2007 10:08 AM  Additional Follow-up for Phone Call Additional follow up Details #1::        she uses this for her chronic pain of avascular necrosis.  she has been stable on this dose.  I have already gone and faxed it to her pharmacy.  she needs to followup with her new MD next month in July to meet them to get another refill.  please call and inform.   Additional Follow-up by: Wilhemina Bonito  MD,  December 09, 2007 10:44 AM    Additional Follow-up for Phone Call Additional follow up Details #2::    PT RETURNING SOMEONE'S CALL PLEASE CALL BACK Follow-up by: Dedra Skeens CMA,,  December 09, 2007 12:09 PM  Additional Follow-up for Phone Call Additional follow up Details #3:: Details for Additional Follow-up Action Taken: pt notified Additional Follow-up by: Alphia Kava,  December 09, 2007 12:21 PM    Prescriptions: VICODIN HP 10-660 MG TABS (HYDROCODONE-ACETAMINOPHEN) 1 tablet every 4-6 hrs  for pain.  #120 x 0   Entered and Authorized by:   Wilhemina Bonito  MD   Signed by:   Wilhemina Bonito  MD on 12/09/2007   Method used:   Faxed to ...       [facility] [facility] [phone]*       [address] [place], [place]  [address]       Ph: [phone] or [phone]       Fax: [phone]   RxID:   [id number]

## 2010-07-24 NOTE — Progress Notes (Signed)
Summary: lost rx  Phone Note Call from Patient Call back at [phone]   Reason for Call: Talk to Nurse Summary of Call: pt sts she misplaced her rx for her prenatal vitamins and her bp medication, would like to know if they can be sent to pharmacy, [facility] Initial call taken by: ERIN LEVAN,  May 13, 2007 2:24 PM  Follow-up for Phone Call        done.  [facility]  please call and inform patient.   Follow-up by: Wilhemina Bonito  MD,  May 13, 2007 5:51 PM  Additional Follow-up for Phone Call Additional follow up Details #1::        called pt and informed Additional Follow-up by: Arlyss Repress CMA,,  May 14, 2007 8:40 AM      Prescriptions: LABETALOL HCL 300 MG TABS (LABETALOL HCL) Take 1 tablet by mouth twice a day  #62 x 6   Entered and Authorized by:   Wilhemina Bonito  MD   Signed by:   Wilhemina Bonito  MD on 05/13/2007   Method used:   Electronically sent to ...       [facility]  [address] [facility] [phone]*       [address] [place], [place]  [address]       Ph: [phone] or [phone]       Fax: [phone]   RxID:   [id number] PRE-NATAL   TABS (PRENATAL MULTIVIT-MIN-FE-FA) 1 tablet a day, may substitute for whatever is on medicaid formulary  #34 x 6   Entered and Authorized by:   Wilhemina Bonito  MD   Signed by:   Wilhemina Bonito  MD on 05/13/2007   Method used:   Electronically sent to ...       [facility] [facility] [phone]*       [address] [place], [place]  [address]       Ph: [phone] or [phone]       Fax: [phone]   RxID:   [id number] HYDRALAZINE HCL 50 MG  TABS (HYDRALAZINE HCL) 1 tablet four times a day for blood pressure.  #120 x 6   Entered and Authorized by:   Wilhemina Bonito  MD   Signed by:   Wilhemina Bonito  MD on 05/13/2007   Method used:   Electronically sent to ...       [facility] [facility] [phone]*       [address] [place], [place]  [address]       Ph: [phone]  or [phone]       Fax: [phone]   RxID:   [id number]

## 2010-07-24 NOTE — Progress Notes (Signed)
Summary: Medications  Phone Note Call from Patient Call back at Home Phone [phone] Call back at [phone]   Reason for Call: Talk to Doctor Summary of Call: wants to speak with pcp about some medications - states she has some concerns - wants to know if she needs prior authorization from [facility] for rx Initial call taken by: Haydee Salter,  February 02, 2007 8:43 AM  Follow-up for Phone Call        called patient.  LMOM.  Have nurse to take message regarding which medications and her concerns.   Follow-up by: Wilhemina Bonito  MD,  February 02, 2007 10:34 AM  Additional Follow-up for Phone Call Additional follow up Details #1::        message left on voicemail to return call to discuss. Additional Follow-up by: Theresia Lo RN,  February 03, 2007 2:46 PM    Additional Follow-up for Phone Call Additional follow up Details #2::    returning call Follow-up by: Haydee Salter,  February 04, 2007 1:53 PM  Additional Follow-up for Phone Call Additional follow up Details #3:: Details for Additional Follow-up Action Taken: pt returned call, she sts Dr. Sandria Manly left her a message stating she already refaxed prior authorization and that's all the pt needed. no follow up needed Additional Follow-up by: ERIN LEVAN,  February 04, 2007 2:00 PM

## 2010-07-24 NOTE — Assessment & Plan Note (Signed)
Summary: 6 wk pp, df   Vital Signs:  Patient profile:   30 year old female Height:      64.5 inches Weight:      175 pounds BMI:     29.68 Temp:     98.0 degrees F oral Pulse rate:   86 / minute Pulse rhythm:   regular BP sitting:   147 / 82  (left arm)  Vitals Entered By: Modesta Messing LPN (December 14, 2008 3:57 PM) CC: 6 week postpartum visit Is Patient Diabetic? No Pain Assessment Patient in pain? no        Primary Care Provider:  Helane Rima MD  CC:  6 week postpartum visit.  History of Present Illness: Delivered viable female 10/22/2008. Induced at 39 weeks due to HTN.  After 21 hours of labor, infant's heart rate dropped and she was taken for C/S.    Went home from hospital 3 days later.  Had to be re-hospitalized for abdominal pain and fever which was  thought due to ileus in addtion to skin infection at c/s scar vs. pelvic infection.  Stayed in hospital 4 days and released on flagyl and azithromycin to treat presumed gonorrhea or chlamydia but infection not confirmed.  Both breast and bottle feeding. Got depo prior to hospital discharge and wants to continue this. Denies symptoms of postpartum depression.  Lupus- previously followed by Dr. Phylliss Bob who is retiring in August.  Will be followed by Dr. Azzie Roup.  Hopes to go back on plaquenil and come off prednisone.   Avascular necrosis of hips- followed by Dr. Judson Roch, last visit was over 1 year ago.  Plans to call and schedule f/u appointment.  Takes vicodin 3-4 days per week for pain mostly in right hip.    Allergies: No Known Drug Allergies  Past History:  Past Medical History: 2006- avascular necrosis of hips, on chronic pain meds- stable dose of vicodin, followed by Dr. Charlett Blake  hx of iron def anemia HTN- diagnosed in 1999 with birth of 1st child Lupus diagnosed in 2003, on chronic prednisone prescribed by Dr. Phylliss Bob [id number]- 1 vaginal birth (1999) and 1 c/s (2010).  Past Surgical History: appendix-  2000 c/s due to NRFHT- May 2010  Social History: lives with 53 year old son Lanny Cramp) and newborn baby Nurse, learning disability) ; Has a supportive mother, fiance Margaretmary Eddy different FOB than her 5 yr old son.  Working at daycare center. No alcohol, tobacco, or drugs.  Physical Exam  General:  alert, well-developed, well-nourished, and well-hydrated.   Head:  normocephalic and atraumatic.   Lungs:  Normal respiratory effort, chest expands symmetrically. Lungs are clear to auscultation, no crackles or wheezes. Heart:  Normal rate and regular rhythm. S1 and S2 normal without gallop, murmur, click, rub or other extra sounds. Abdomen:  soft, non-tender, normal bowel sounds, no distention, no masses, no guarding, and no rigidity.  Fundus not palpated.  C/S scar well-healed  Extremities:  no swelling Neurologic:  alert & oriented X3.   Psych:  normally interactive, good eye contact, not anxious appearing, and not depressed appearing.     Impression & Recommendations:  Problem # 1:  ROUTINE POSTPARTUM FOLLOW-UP (ICD-V24.2) coninue depo for contraception.  Advised that depo may not be best long-term soluion for birth control given possible risk of osteoporosis (she is on prednisone in addition!).  She is considering implanon. Orders: Postpartum visit- [facility] [phone])  Problem # 2:  AVASCULAR NECROSIS, FEMORAL HEAD (ICD-733.42) pt to call Dr.  Voytek or f/u appt.  Problem # 3:  SLE (ICD-710.0) f/u with Dr. Dareen Piano.  Hopes to wean from prednisone and transition to plaqeunil Her updated medication list for this problem includes:    Prednisone 10 Mg Tabs (Prednisone) .Marland Kitchen... 1 tablet by mouth daily- refills to come from dr. Phylliss Bob  Problem # 4:  HYPERTENSION, BENIGN SYSTEMIC (ICD-401.1) BP high oday, but she states always 120-130 systolic at home.  Advised to call us if BP remains elevated. The following medications were removed from the medication list:    Hydralazine Hcl 50 Mg Tabs (Hydralazine hcl)  .Marland Kitchen... 1 tablet four times a day for blood pressure. Her updated medication list for this problem includes:    Labetalol Hcl 300 Mg Tabs (Labetalol hcl) .Marland Kitchen... Take 1 tablet by mouth twice a day    Hydrochlorothiazide 25 Mg Tabs (Hydrochlorothiazide) .Marland Kitchen... 1 tablet by mouth daily for blood pressure  Problem # 5:  PAP SMEAR, ABNORMAL, ASCUS (ICD-795.01) no high risk HPV deteced. pap smear due 02/2009.  Complete Medication List: 1)  Labetalol Hcl 300 Mg Tabs (Labetalol hcl) .... Take 1 tablet by mouth twice a day 2)  Prednisone 10 Mg Tabs (Prednisone) .Marland Kitchen.. 1 tablet by mouth daily- refills to come from dr. Phylliss Bob 3)  Vicodin Hp 10-660 Mg Tabs (Hydrocodone-acetaminophen) .Marland Kitchen.. 1 tablet every 4-6 hrs  for pain. 4)  Prenatal 19 Tabs (Prenatal vit-dss-fe fum-fa) .Marland Kitchen.. 1 tablet by mouth daily 5)  Hydrochlorothiazide 25 Mg Tabs (Hydrochlorothiazide) .Marland Kitchen.. 1 tablet by mouth daily for blood pressure  Patient Instructions: 1)  Let us know if BP is running high at home.  Refills sent. 2)  Call Dr. Charlett Blake. 3)  Pap smear and BP recheck due in September with Dr. Earlene Plater. Prescriptions: HYDROCHLOROTHIAZIDE 25 MG TABS (HYDROCHLOROTHIAZIDE) 1 tablet by mouth daily for blood pressure  #30 x 6   Entered and Authorized by:   Sylvan Cheese MD   Signed by:   Sylvan Cheese MD on 12/14/2008   Method used:   Electronically to        [facility] [phone]* (retail)       [address] [place], [place]  [address]       Ph: [phone]       Fax: [phone]   RxID:   [id number] LABETALOL HCL 300 MG TABS (LABETALOL HCL) Take 1 tablet by mouth twice a day  #62 x 6   Entered and Authorized by:   Sylvan Cheese MD   Signed by:   Sylvan Cheese MD on 12/14/2008   Method used:   Electronically to        [facility] [phone]* (retail)       [address] [place], [place]  [address]       Ph: [phone]       Fax: [phone]   RxID:   [id number] LABETALOL HCL 300 MG TABS (LABETALOL HCL) Take 1  tablet by mouth twice a day  #62 x 6   Entered and Authorized by:   Sylvan Cheese MD   Signed by:   Sylvan Cheese MD on 12/14/2008   Method used:   Electronically to        [facility] [phone]* (retail)       [address] [place], [place]  [address]       Ph:  [phone] or [phone]       Fax: [phone]   RxID:   [id number] HYDROCHLOROTHIAZIDE 25 MG TABS (HYDROCHLOROTHIAZIDE) 1 tablet by mouth daily for blood pressure  #30 x 6   Entered and Authorized by:   Sylvan Cheese MD   Signed by:   Sylvan Cheese MD on 12/14/2008   Method used:   Electronically to        [facility] [phone]* (retail)       [address] [place], [place]  [address]       Ph: [phone] or [phone]       Fax: [phone]   RxID:   [id number]

## 2010-07-24 NOTE — Progress Notes (Signed)
Summary: needs letter  Phone Note Call from Patient Call back at [phone]   Caller: Patient Summary of Call: needs a letter stating about her physical exam: vital signs BP lungs/heart allergies family hx (if any) fax to [phone] - [place] Initial call taken by: De Nurse,  October 03, 2009 10:01 AM

## 2010-07-24 NOTE — Miscellaneous (Signed)
Summary: Medical record request  Clinical Lists Changes  Rec'd medical record  request to go to: Disablitiy Determination Services Date sent: 04/13/10 Marily Memos  May 22, 2010 2:12 PM

## 2010-07-24 NOTE — Assessment & Plan Note (Signed)
Summary: cp from coughing 4 days (has lupus)   Vital Signs:  Patient Profile:   30 Years Old Female Height:     64.5 inches Weight:      177 pounds Temp:     98.1 degrees F Pulse rate:   82 / minute BP sitting:   118 / 74  Pt. in pain?   yes    Location:   chest    Intensity:   6  Vitals Entered By: [name] CMA (Nov 06, 2007 9:49 AM)                  PCP:  Wilhemina Bonito  MD  Chief Complaint:  chest tenderness from coughing x 4 days.  History of Present Illness: Cough: has had cough since Monday.  Productive of clear sputum (minimal amount).  Worse at night. With the cough has a sharp pain substernally.  This is the only time she has the pain in her chest. Has not taken any meds to help with the pain or cough for fear it may interact with her lupus medications or cause lupus problems.  Also has some shortness of breath from the cough. Denies any sick contacts.     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  ENT      Denies earache, nasal congestion, postnasal drainage, sinus pressure, and sore throat.  CV      Complains of chest pain or discomfort and difficulty breathing at night.      Denies difficulty breathing while lying down and shortness of breath with exertion.  Resp      Complains of chest discomfort, cough, shortness of breath, and sputum productive.      Denies chest pain with inspiration and wheezing.  GI      Denies abdominal pain, change in bowel habits, constipation, and diarrhea.  MS      Denies joint pain and joint swelling.  Derm      Complains of rash.   Physical Exam  General:     Well-developed,well-nourished,in no acute distress; alert,appropriate  and cooperative throughout examination Ears:     External ear exam shows no significant lesions or deformities.  Otoscopic examination reveals clear canals, tympanic membranes are intact bilaterally without bulging, retraction, inflammation or discharge. Nose:     External nasal examination shows no deformity or inflammation. Nasal mucosa are pink and moist without lesions or exudates. Mouth:     Oral mucosa and oropharynx without lesions or exudates.  Teeth in good repair. Neck:     No deformities, masses, or tenderness noted.  No lymphadenopathy. Chest Wall:     No deformities, masses, or tenderness noted. Lungs:     Normal respiratory effort, chest expands symmetrically. Lungs are clear to auscultation, no crackles or wheezes. Heart:     Normal rate and regular rhythm. S1 and S2 normal without gallop, murmur, click, rub or other extra sounds. Skin:     red, plaque like rash scattered on upper L arm.  Several distinct lesions. Nontender.  Not warm.  Consistent with previous lupus skin rashes.  Cervical Nodes:     No lymphadenopathy noted    Impression & Recommendations:  Problem # 1:  CHEST PAIN (ICD-786.50) Assessment: Deteriorated Suspect this is secondary to coughing.  Will treat cough with robitussin and pain with ibuprofen - script sent to pharmacy. If not improving this certainly could be pericardial pain from her lupus so have asked her to return if it is not better in the next 7 days or if shortness of breath signficantly worsens.  Also suggested symptomatic relief such as humidifier and suggested if worse at night she may take one of her vicodin.   Orders: [facility]- Est Level  3 ([address]11)   Problem # 2:  SLE (ICD-710.0) Assessment: Deteriorated New rash today.  Advised to avoid sun as previously directed.  Certainly current pain could also be related to a lupus flare so if not improving within next 7 days have asked patient to return.   Her updated medication list for this  problem includes:    Prednisone 10 Mg Tabs (Prednisone)    Ibuprofen 600 Mg Tabs (Ibuprofen) .Marland Kitchen... Take 1 tab by mouth every 6 hours for pain  Orders: [facility]- Est Level  3 (91478)   Complete Medication List: 1)  Hydralazine Hcl 50 Mg Tabs (Hydralazine hcl) .Marland Kitchen.. 1 tablet four times a day for blood pressure. 2)  Labetalol Hcl 300 Mg Tabs (Labetalol hcl) .... Take 1 tablet by mouth twice a day 3)  Prednisone 10 Mg Tabs (Prednisone) 4)  Vicodin Hp 10-660 Mg Tabs (Hydrocodone-acetaminophen) .Marland Kitchen.. 1 tablet every 4-6 hrs  for pain. 5)  Ibuprofen 600 Mg Tabs (Ibuprofen) .... Take 1 tab by mouth every 6 hours for pain   Patient Instructions: 1)  You may take Robitussin for your cough as directed on the bottle.  2)  I would also like you to take the ibuprofen I have prescribed for the next 7 days to help with the pain. 3)  If your pain is not better or even gets worse in the next 7 days please come in to be seen again.  It could be a lupus flare that is causing your pain.   Prescriptions: IBUPROFEN 600 MG  TABS (IBUPROFEN) take 1 tab by mouth every 6 hours for pain  #28 x 0   Entered and Authorized by:   Ancil Boozer  MD   Signed by:   Ancil Boozer  MD on 11/06/2007   Method used:   Electronically sent to ...       [facility]  [address] [facility] [phone]*       [address] [place], [place]  [address]       Ph: [phone] or [phone]       Fax: [phone]   RxID:   [id number]  ]

## 2010-07-24 NOTE — Progress Notes (Signed)
Summary: Prior authorization  Phone Note Call from Patient Call back at Home Phone [phone]   Reason for Call: Talk to Doctor Summary of Call: pt states she needs prior authorization on pain medication - needs this done today Initial call taken by: Haydee Salter,  January 05, 2007 8:31 AM  Follow-up for Phone Call        CALLED PT, LMAM TO CALL BACK WITH DETAILED INFO ON PRIOR AUTH. WHICH MEDS?  Follow-up by: Arlyss Repress CMA,,  January 05, 2007 11:46 AM  Additional Follow-up for Phone Call Additional follow up Details #1::        needs prior auth. for oxycontin. form given to dr.love to fill out, so we can fax request. informed pt Additional Follow-up by: Arlyss Repress CMA,,  January 05, 2007 3:17 PM    Additional Follow-up for Phone Call Additional follow up Details #2::    pt is calling checking status of authorization Follow-up by: ERIN LEVAN,  January 05, 2007 4:54 PM  Additional Follow-up for Phone Call Additional follow up Details #3:: Details for Additional Follow-up Action Taken: done! Additional Follow-up by: Wilhemina Bonito  MD,  January 05, 2007 5:42 PM

## 2010-07-24 NOTE — Progress Notes (Signed)
Summary: Refill  Phone Note Refill Request Call back at Home Phone [phone]   Refills Requested: Medication #1:  VICODIN HP 10-660 MG TABS 1 tablet every 4-6 hrs  for pain. Initial call taken by: Knox Royalty,  April 06, 2008 1:53 PM  Follow-up for Phone Call        will send message to Dr. Luz Brazen. Follow-up by: Theresia Lo RN,  April 06, 2008 4:42 PM      Prescriptions: VICODIN HP 10-660 MG TABS (HYDROCODONE-ACETAMINOPHEN) 1 tablet every 4-6 hrs  for pain.  #120 x 1   Entered and Authorized by:   Levander Campion MD   Signed by:   Levander Campion MD on 04/07/2008   Method used:   Print then Give to Patient   RxID:   [id number]

## 2010-07-24 NOTE — Letter (Signed)
Summary: CPE Letter  [name]  [address]   [place], [place] [address]   Phone: [phone]  Fax: [phone]    10/03/2009  Re: Angela Walls [address] [place], [place]  [address]  Vital Signs: Patient profile:   30 year old female Temp:     98.1 degrees F oral Pulse rate:   72 / minute BP sitting:   134 / 81  (left arm) Cuff size:   regular  Allergies: No known drug allergies  Family History: Father - brain aneurysm Mother - anemia Brothers and Sisters - healthy Sons - healthy  Physical Exam: General:  Well-developed, well-nourished, in no acute distress; alert, appropriate and cooperative throughout examination. Vitals reviewed. Head:  Normocephalic and atraumatic without obvious abnormalities.  Eyes:  No corneal or conjunctival inflammation noted. EOMI. Perrla. Vision grossly normal. Ears:  R ear normal and L ear normal.   Mouth:  Oral mucosa and oropharynx without lesions or exudates.   Neck:  No deformities, masses, or tenderness noted. Lungs:  Normal respiratory effort, chest expands symmetrically. Lungs are clear to auscultation, no crackles or wheezes. Heart:  Normal rate and regular rhythm. S1 and S2 normal without gallop, murmur, click, rub or other extra sounds. Abdomen:  Bowel sounds positive,abdomen soft and non-tender without masses, organomegaly or hernias noted. Msk:  Deferred since she will be seen at Orthopedic office this week. Pulses:  R and L dorsalis pedis and posterior tibial pulses are full and equal bilaterally. Extremities:  No edema. Neurologic:  Alert & oriented X 3, gait normal.   Skin:  Multiple hypopigmented areas on face. Psych:  Normally interactive, good eye contact, and not anxious appearing.    Sincerely,  Helane Rima DO   Appended Document: CPE Letter Faxed [phone]

## 2010-07-24 NOTE — Assessment & Plan Note (Signed)
Summary: NOB   Vital Signs:  Patient Profile:   30 Years Old Female Height:     64.5 inches Weight:      176 pounds Temp:     98.5 degrees F Pulse rate:   81 / minute BP sitting:   144 / 79  (left arm)  Pt. in pain?   no  Vitals Entered By: Theresia Lo RN (March 09, 2008 11:04 AM)  Menstrual History: EDC by LMP: 11/01/2008 Best Working EDC: 11/01/2008 LMP - Character: normal LMP - Reliable: Yes Menarche: 13 Menses interval: 28 days Menstrual flow: 4 days On BCP's at conception: no Date of positive (+) home preg. test: 02/23/2008              Is Patient Diabetic? No       Prior Medication List:  HYDRALAZINE HCL 50 MG  TABS (HYDRALAZINE HCL) 1 tablet four times a day for blood pressure. LABETALOL HCL 300 MG TABS (LABETALOL HCL) Take 1 tablet by mouth twice a day PREDNISONE 10 MG  TABS (PREDNISONE)  VICODIN HP 10-660 MG TABS (HYDROCODONE-ACETAMINOPHEN) 1 tablet every 4-6 hrs  for pain.   Current Allergies: No known allergies   Past Medical History:    Reviewed history from 12/17/2007 and no changes required:       2006- avascular necrosis of hips, on chronic pain meds- stable dose of hd vicodin, followed by Dr. Charlett Blake       osteoporosis prevention, on chronic steroids       hx of iron def anemia       HTN- diagnosed in 1999 with birth of 1st child       Lupus diagnosed in 2003, followed by Dr. Phylliss Bob       1 elective abortion, 3 spontaneous abortions       Current Problems:        Hx of ABORTION, SPONTANEOUS (ICD-634.90)       PAP SMEAR, ABNORMAL, ASCUS (ICD-795.01)       PREGNANCY, NORMAL (ICD-V22.2)       BREAST MASS, LEFT (ICD-611.72)       SEXUAL ACTIVITY, HIGH RISK (ICD-V69.2)       AVASCULAR NECROSIS, FEMORAL HEAD (ICD-733.42)       SLE (ICD-710.0)       PROTEINURIA (ICD-791.0)       HYPERTENSION, BENIGN SYSTEMIC (ICD-401.1)       ANEMIA, OTHER, UNSPECIFIED (ICD-285.9)         Past Surgical History:    appendix- 2000   Family History:  HTN- GM    DM2- GM    father- brain aneurysm    mother- anemia    brothers and sisters- healthy    son- healthy  Social History:    lives with 33  year old son Lanny Cramp); has a supportive mother who works at [employer], Veterinary surgeon, different FOB than her 45 yr old son.  Working at daycare center. no alcohol, tobacco, or drugs.   Risk Factors:  Tobacco use:  quit    Year quit:  4 yrs ago      Impression & Recommendations:  Problem # 1:  PREGNANCY, NORMAL (ICD-V22.2) 30 yo [id number] at 6 1/7 weeks based on reliable LMP.   pap smear (03/01/08)- ASCUS without high risk HPV detected.  Repeat pap smear in 1 year. urine culture- 75,000 colonies multiple morphotypes- recollected for culutre again today. GC/CT- negative 03/01/08 HepB neg, HIV NR, RPR NR, Rubella Immune, O+, sickle cell negative  Plan: 1.  given h/o HTN, lupus, and h/o PreE with prior pregnancy, pt needs referral to [facility]. 2.  obtain US for viability 3. repeat urine culture collected today Orders: Urine Culture-[facility] ([address]) Medicaid OB visit - [facility] ([address]) Prenatal U/S < 14 weeks - 91478  (Prenatal U/S) UA Glucose/Protein-[facility] (81002)   Problem # 2:  HYPERTENSION, BENIGN SYSTEMIC (ICD-401.1) cont meds. Her updated medication list for this problem includes:    Hydralazine Hcl 50 Mg Tabs (Hydralazine hcl) .Marland Kitchen... 1 tablet four times a day for blood pressure.    Labetalol Hcl 300 Mg Tabs (Labetalol hcl) .Marland Kitchen... Take 1 tablet by mouth twice a day   Problem # 3:  SLE (ICD-710.0) cont prednisone, currently stable Her updated medication list for this problem includes:    Prednisone 10 Mg Tabs (Prednisone)   Problem # 4:  AVASCULAR NECROSIS, FEMORAL HEAD (ICD-733.42) chronic vicodin therapy  Complete Medication List: 1)  Hydralazine Hcl 50 Mg Tabs (Hydralazine hcl) .Marland Kitchen.. 1 tablet four times a day for blood pressure. 2)  Labetalol Hcl 300 Mg Tabs (Labetalol hcl) .... Take 1 tablet by mouth twice a day 3)  Prednisone 10 Mg  Tabs (Prednisone) 4)  Vicodin Hp 10-660 Mg Tabs (Hydrocodone-acetaminophen) .Marland Kitchen.. 1 tablet every 4-6 hrs  for pain. 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 : Yes    Menarche: 13 years    Menses interval: 28 days    Menstrual flow 4 days    On BCP's at conception: no    Date of positive (+) home preg. test: 02/23/2008    Pre Pregnancy Weight: 176 lbs.    Symptoms since LMP: amenorrhea, nausea, tender breasts  Prenatal Visit    [facility] Confirmation:    New working [facility]: 11/01/2008    LMP reliable? Yes    EDC by LMP: 11/01/2008   Past Pregnancy History    Gravida:     6    Premature Births:   1    Living Children:   1    Para:       5    Mult. Births:     0    Prev C-Section:   0    Elect. Ab:     1    Spont. Ab:     3    Ectopics:     0  Pregnancy # 1    Delivery date:     1998    Comments:  elective abortion  Pregnancy # 2    Delivery date:     03/25/1998    Weeks Gestation:   36    Delivery type:     NSVD    Hours of labor:     15.5 hours    Anesthesia type:     epidural    Delivery location:     [facility]    Infant Sex:     Female    Birth weight:     4 pounds 12 oz    Name:     Lanny Cramp    Comments:      pre-eclampsia, induced at 36 weeks for drop in fetal heart tones  Pregnancy # 3    Delivery date:     2003    Comments:     spontaneous miscarriage- no D&C  Pregnancy # 4    Delivery date:     2005    Comments:     spontaneous miscarriage- thinks she took methotrexate  Pregnancy # 5    Delivery date:     08/2007    Weeks Gestation:   10    Comments:     spontaneous miscarriage @ 10 weeks, D&C  Pregnancy # 6    Comments:     current pregnancy  Genetic History    Father of baby:   Margaretmary Eddy     Thalassemia:     mother: no   father: no    Neural tube defect:   mother: no   father: no    Down's Syndrome:   mother: no   father: no    Tay-Sachs:     mother: no   father: no    Sickle Cell Dz/Trait:   mother: no   father: no    Hemophilia:     mother: no   father: no    Muscular Dystrophy:   mother: no   father: no    Cystic Fibrosis:   mother: no   father: no    Huntington's Dz:   mother: no   father: no    Mental Retardation:   mother: no   father: no    Fragile X:     mother: no   father: no    Other Genetic or       Chromosomal Dz:   mother: no   father: no    Child with other       birth defect:     mother: no   father: no    > 3 spont. 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  Environmental Exposures    Xray Exposure since LMP: no    Chemical or other exposure: no    Medication, drug, or alcohol use since LMP: no   Flowsheet View for Follow-up Visit    Estimated weeks of       gestation:     6 1/7    Weight:     176    Blood pressure:   144 / 79       Vital Signs:  Patient Profile:   30 Years Old Female Height:     64.5 inches Weight:      176 pounds Temp:  98.5 degrees F Pulse rate:   81 / minute BP sitting:   144 / 79 Height (in): 64.5   Pre-pregnant weight (lbs): 176      Appearance: well developed, well nourished, no acute distress Head exam: normocephalic, no lesions or deformities Teeth/Gums: normal dentition Thyroid: no nodules, masses, tenderness, or enlargement Breast exam: no masses or nipple discharge Lungs: clear to auscultation Heart: S1S2 regular rate and rhythm, no murmurs Abdomen: soft, non-tender, no masses Skin: no rashes Comments: for GU exam see notes by Dr. Clelia Croft 03/01/08 when pelvic and pap smear was performed.   Appended Document: NOB referral faxed to [facility] for High Risk clinic appointment.

## 2010-07-24 NOTE — Progress Notes (Signed)
Summary: Medication  Phone Note Call from Patient Call back at Home Phone [phone]   Reason for Call: Talk to Nurse Summary of Call: pt calling us back, she sts she tore her old rx up & can't bring it in  Initial call taken by: ERIN LEVAN,  September 10, 2006 2:18 PM  Follow-up for Phone Call        [facility] [address], but doesn't know who she gave the rx to Follow-up by: Haydee Salter,  September 11, 2006 8:38 AM  Additional Follow-up for Phone Call Additional follow up Details #1::        Called and left message that prescription for Oxycotin was ready for pick up.  Also explained that future scripts would only be given at her monthly office visits and if there was any further denials by a pharmacist, we would exchange for a correct one only if the 1st one was presented back to [place]. Additional Follow-up by: Dennison Nancy RN,  September 11, 2006 9:55 AM

## 2010-07-24 NOTE — Assessment & Plan Note (Signed)
Summary: f/u bp [facility]   Vital Signs:  Patient Profile:   30 Years Old Female Weight:      168.3 pounds Temp:     98.9 degrees F Pulse rate:   81 / minute BP sitting:   165 / 101  (right arm)  Pt. in pain?   no  Vitals Entered By: Theresia Lo RN (October 03, 2006 4:11 PM)              Is Patient Diabetic? No   Chief Complaint:  bp f/u .  History of Present Illness: CC htn, pain  pt was initially scheduled for pap but currently on period.  pt will talk just about meds and htn.    Blood pressure elevated today.  no headaches.  spots yesterday, none today.  Pt was well controlled previously on hctz/ace combo but had to be taken off due to pt desiring to become pregnant.    pain doing well on some days.  pt thinks it is job related due to her being on her feet.  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  pt sits occassionally.  taking baths every day and elevates legs.    infertility- pt technically not labeled as infertile yet, however, she has been trying since november to get pregnant.  Pt has not tried the ovulation kit yet nor timed intercourse.    reviewed dr first.    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  not symptomatic.  Orders: [facility]- Est  Level 4 ([address])   Problem # 2:  AVASCULAR NECROSIS, FEMORAL HEAD (ICD-733.42) Assessment: Deteriorated renewed oxycontin. Pain is worse today, but patient still able to function.  Stressed importence of taking breaks during daytime and not lifting anything >20lbs.   Orders: [facility]- Est  Level 4 ([address])   Problem # 3:  INFERTILITY, FEMALE NOS (ICD-628.9) Discussed various methods to help patient with timing of intercourse.  Instructed pt to get home ovulation kit and and to also time intercourse on days 10-17 of menstral cycle.   Orders: [facility]- Est  Level 4 ([address]11)    Patient Instructions: 1)  Please schedule a follow-up appointment in 7 weeks for pap. 2)  remember to have intercourse on days 10-17 of cycle.  with day 1 being the 1day of your menstral period.

## 2010-07-24 NOTE — Progress Notes (Signed)
Summary: status of referral  Phone Note Call from Patient Call back at Home Phone [phone]   Reason for Call: Talk to Nurse Summary of Call: [facility], sts she hasn't heard anything yet Initial call taken by: ERIN LEVAN,  July 28, 2007 2:16 PM  Follow-up for Phone Call        faxed request to gyn clinic [phone] called pt and informed. Follow-up by: Arlyss Repress CMA,,  July 28, 2007 2:41 PM

## 2010-07-24 NOTE — Progress Notes (Signed)
Summary: WAITING FOR PT TO CALL BACK/TS  Phone Note Call from Patient Call back at Home Phone [phone]   Caller: Patient Summary of Call: pt wants to talk to Dr. Earlene Plater about why she is having to come in so often just to get her pain med. Initial call taken by: Clydell Hakim,  February 02, 2009 2:52 PM  Follow-up for Phone Call        tried to call pt. 'long song' unable to leave message. pt has not seen dr.wallace yet. dnka 01-11-09. see note 01-25-09. pt needs to keep appt with dr.wallace on 02-06-09. Follow-up by: Arlyss Repress CMA,,  February 02, 2009 3:05 PM  Additional Follow-up for Phone Call Additional follow up Details #1::        pt called back and confirmed appt w/ Earlene Plater on Monday Additional Follow-up by: De Nurse,  February 02, 2009 3:15 PM

## 2010-07-24 NOTE — Assessment & Plan Note (Signed)
Summary: READ PPD/LOVE/[facility]  Nurse Visit   Vitals Entered ByJacki Cones RN (September 25, 2007 9:40 AM)                 Prior Medications: HYDRALAZINE HCL 50 MG  TABS (HYDRALAZINE HCL) 1 tablet four times a day for blood pressure. LABETALOL HCL 300 MG TABS (LABETALOL HCL) Take 1 tablet by mouth twice a day PREDNISONE 10 MG  TABS (PREDNISONE)  VICODIN HP 10-660 MG TABS (HYDROCODONE-ACETAMINOPHEN) 1 tablet every 4-6 hrs  for pain.    PPD Results    Date of reading: 09/25/2007    Results: < 5mm    Interpretation: negative   Orders Added: 1)  No Charge Patient Arrived (NCPA0) [NCPA0]    ]

## 2010-07-24 NOTE — Progress Notes (Signed)
Summary: refill  Phone Note Call from Patient Call back at [facility] Phone [phone]   Summary of Call: Pt is needing refill on vicodin. Initial call taken by: Haydee Salter,  October 09, 2007 3:51 PM  Follow-up for Phone Call        will forward to dr Sandria Manly Follow-up by: Alphia Kava,  October 09, 2007 4:03 PM  Additional Follow-up for Phone Call Additional follow up Details #1::        ok to refill vicodin as in centricity.  may call in as phone order.  I will be post call on 4/20 so please have RN staff complete.  Thanks.   Additional Follow-up by: Wilhemina Bonito  MD,  October 11, 2007 12:26 PM

## 2010-07-24 NOTE — Assessment & Plan Note (Signed)
Summary: fu wp   Vital Signs:  Patient Profile:   30 Years Old Female Height:     64.5 inches Weight:      182.9 pounds BMI:     31.02 Pulse rate:   86 / minute BP sitting:   155 / 95  (right arm)  Pt. in pain?   no  Vitals Entered By: Arlyss Repress CMA, (September 23, 2007 9:17 AM)              Is Patient Diabetic? No      PCP:  MELISSA LOVE  MD  Chief Complaint:  f/up HTN.  History of Present Illness: pt had a miscarriage at 8th week of IUP, had d and c performed at [facility] hospital on 3/20.  pt is to return on friday for followup.    Pt is here today for follow up Hypertension.    HTN: readings at home in 120s and 130s.   Using medications: hydralazine and labetalol Chest Pain: no SOB:no HA:no Leg Edema:no Previous hx of CVA/TIA:no  pain: under control.  not needing refill today.  has appt with Dr. Phylliss Bob on april 16th.    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 Lungs are clear to auscultation, no crackles or wheezes. Heart:     Normal rate and regular rhythm. S1 and S2 normal without gallop, murmur, click, rub or other extra sounds.    Impression & Recommendations:  Problem # 1:  HYPERTENSION, BENIGN SYSTEMIC (ICD-401.1) Assessment: Unchanged pt  did not take her medications this am, but states it is at goal at home.  no changes.  Her updated medication list for this problem includes:    Hydralazine Hcl 50 Mg Tabs (Hydralazine hcl) .Marland Kitchen... 1 tablet four times a day for blood pressure.    Labetalol Hcl 300 Mg Tabs (Labetalol hcl) .Marland Kitchen... Take 1 tablet by mouth twice a day  Orders: [facility]- Est  Level 4 (69629)  Future Orders: Lipid-[facility] ([id number]) ... 09/30/2008 Basic Met-[facility] (10272-53664) ... 09/28/2008   Problem # 2:  THREATENED ABORTION UNSPECIFIED AS EPISODE CARE (ICD-640.00) s/p d and C.  await 1 menstral cycle then resume attempts to get pregnant again.  reviewed medications, no teratogens on her lists.  Orders: [facility]- Est  Level 4 (40347)   Problem # 3:  AVASCULAR NECROSIS, FEMORAL HEAD (  ZOX-096.04) Assessment: Improved stable.  pt to call 1 week prior to running out of vicodin for refill.   The following medications were removed from the medication list:    D 1000 1000 Unit Caps (Cholecalciferol) .Marland Kitchen... Take 1 capsule by mouth once a day  Orders: [facility]- Est  Level 4 (54098)   Complete Medication List: 1)  Hydralazine Hcl 50 Mg Tabs (Hydralazine hcl) .Marland Kitchen.. 1 tablet four times a day for blood pressure. 2)  Labetalol Hcl 300 Mg Tabs (Labetalol hcl) .... Take 1 tablet by mouth twice a day 3)  Prednisone 10 Mg Tabs (Prednisone) 4)  Vicodin Hp 10-660 Mg Tabs (Hydrocodone-acetaminophen) .Marland Kitchen.. 1 tablet every 4-6 hrs  for pain.  Other Orders: TB Skin Test [phone])   Patient Instructions: 1)  Please schedule a follow-up appointment in 2  months. 2)  call 1 week prior to running out of your vicodin to pick up new Rx.     Prescriptions: PREDNISONE 10 MG  TABS (PREDNISONE)   #30 x 11   Entered by:   Arlyss Repress CMA,   Authorized by:   Wilhemina Bonito  MD   Signed by:   Wilhemina Bonito  MD on 09/23/2007   Method used:   Historical   RxID:   [id number]  ]  PPD Application    Vaccine Type: PPD    Site: left forearm    Mfr:  Sanofi Pasteur    Dose: 0.1 ml    Route: ID    Given by: Arlyss Repress CMA,    Exp. Date: 09/09/2009    Lot #: [id number]

## 2010-07-24 NOTE — Progress Notes (Signed)
Summary: Triage  Phone Note Call from Patient Call back at [phone]   Summary of Call: Pt is experiencing chest pain...states she has been coughing all week and needs to be seen this morning. Initial call taken by: Haydee Salter,  Nov 06, 2007 8:38 AM  Follow-up for Phone Call        c/o cough since monday. no otc used. chest pain from cough. work in appt made. denies cardiac symptoms Follow-up by: Golden Circle RN,  Nov 06, 2007 8:40 AM

## 2010-07-24 NOTE — Miscellaneous (Signed)
Summary: ROI  ROI   Imported By: Knox Royalty 03/02/2008 14:42:00  _____________________________________________________________________  External Attachment:    Type:   Image     Comment:   External Document

## 2010-07-24 NOTE — Letter (Signed)
Summary: Out of Work  All     ,     Phone:   Fax:     March 05, 2007   Employee:  Nigel Berthold D Raver    To Whom It May Concern:   For Medical reasons, please excuse the above named employee from work for the following dates:  Start:   03/05/07  End:   03/05/07  If you need additional information, please feel free to contact our office.         Sincerely,    Monserrath Junio  MD

## 2010-07-24 NOTE — Progress Notes (Signed)
Summary: Refill  Phone Note Call from Patient Call back at Home Phone [phone]   Summary of Call: pt states rx for oxycotin isn't written correctly - needs to speak with someone asap Initial call taken by: Haydee Salter,  September 10, 2006 9:28 AM  Follow-up for Phone Call        forward to pcp Follow-up by: Dedra Skeens CMA,,  September 10, 2006 9:40 AM  Additional Follow-up for Phone Call Additional follow up Details #1::        attempted to call no answer.  new one written.  must recieve old one to get new one.  Additional Follow-up by: Barth Kirks MD,  September 10, 2006 10:28 AM   Additional Follow-up for Phone Call Additional follow up Details #2::    left pt a message telling her rx is ready and to bring old one  Follow-up by: Haydee Salter,  September 10, 2006 12:06 PM

## 2010-07-24 NOTE — Assessment & Plan Note (Signed)
Summary: pain meds, htn   Vital Signs:  Patient Profile:   30 Years Old Female Weight:      167 pounds Pulse rate:   71 / minute BP sitting:   171 / 105  Vitals Entered By: Lillia Pauls CMA (March 05, 2007 8:48 AM)                 Chief Complaint:  HA'S AND R HIP PAIN--------[facility].  History of Present Illness: Pt not feeling too good.  pt has been in more pain lately.  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  pt has been denied her oxycontin by medicaid since she has not tried Morphine Sulfate in last year.  Pt has been on Oxycontin SR 10mg  by mouth two times a day for at least 5 years now without escalation of her dosage.  Pt actually wants to get off of her chronic pain meds.  She does not want to be on addicting medication.  Pt was previously on Vicodin 7.5/500 prior to being started on oxycontin 5 years ago.  Pt would like to see if she could go back to vicodin.  Pt is in significant pain of her hips and is going to schedule an appt this week with Dr. Judson Roch of orthopedics to followup on her avascular necrosis of her hips.  We discussed the benefits pain meds vs possibility of hip replacement and she will discuss this with her orthopedist. Pt is going to quit her job at the end of this month due to the pain.    Pt is also very frustrated about not getting pregnant now for over 1 year.  pt has been trying but has not been successful.  Pt has known HTN for which she was well controlled on ace inhibitor but had to be switched to medications that were safe during pregnancy.  Currently she is on hydralazine and labetalol.  Pt would like to discuss with an obstetrician about pregnancy.       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 Lungs are clear to auscultation, no crackles or wheezes. Heart:     Normal rate and regular rhythm. S1 and S2 normal without gallop, murmur, click, rub or other extra sounds.    Impression & Recommendations:  Problem # 1:  AVASCULAR NECROSIS, FEMORAL HEAD (ICD-733.42) Written out hand prescription of Vicodin 10/660 1 tablet every 4-5 hours for pain, #120 no refills.  Pt understands that this medication  will only be filled by me, in person, at regularly scheduled office visits.  Encouraged patient to make appt with Orthopedist for followup of avascular necrosis and for possible treatments.   Her updated medication list for this problem includes:    D 1000 1000 Unit Caps (Cholecalciferol) .Marland Kitchen... Take 1 capsule by mouth once a day  Orders: [facility]- Est  Level 4 (16109)   Problem # 2:  SLE (ICD-710.0) Pt still seeing her rheumatologist.  no recent problems.  Will recheck kidney function since patient has been taking greater than recomended dosages of ibuprofen.  Instructed pt to stop ibuprofen.  Creatinine went from 0.9 to 1.06.  will continue to monitor.   Her updated medication list for this problem includes:    Ibuprofen 800 Mg Tabs (Ibuprofen) .Marland Kitchen... Take 1 tablet by mouth every eight hours    Plaquenil 200 Mg Tabs (Hydroxychloroquine sulfate) .Marland Kitchen... 2 tablet by mouth once a day     Prednisone 20 Mg Tabs (Prednisone) .Marland Kitchen... Take 1 tablet by mouth once a day  Orders: [facility]- Est  Level 4 (60454)   Problem # 3:  HYPERTENSION, BENIGN SYSTEMIC (ICD-401.1) Per patient, her home recordings have had sbps <140.  Will not change current medications at this time.  Pt is still desiring to become pregnant.  wil have her meet with ob/gyn to discuss options.  followup kidney function.   Her updated medication list for this problem includes:    Hydralazine Hcl 10 Mg Tabs (Hydralazine hcl) .Marland Kitchen... Take 1 tablet by mouth three times a day    Labetalol Hcl 300 Mg Tabs (Labetalol hcl) .Marland Kitchen... Take 1 tablet by mouth twice a day  Orders: [facility]- Est  Level 4 (09811) Basic Met-[facility] ([id number])   Complete Medication List: 1)  D 1000 1000 Unit Caps (Cholecalciferol) .... Take 1 capsule by mouth once a day 2)  Folic Acid 1 Mg Tabs (Folic acid) .... Take 1 tablet by mouth once a day 3)  Hydralazine Hcl 10 Mg Tabs (Hydralazine hcl) .... Take 1 tablet by mouth three times a day 4)  Ibuprofen 800 Mg Tabs (Ibuprofen) .... Take 1 tablet by mouth every eight hours 5)  Labetalol Hcl 300 Mg Tabs (Labetalol hcl) .... Take 1 tablet by mouth twice a day 6)  Oxycontin 10 Mg Tb12 (Oxycodone hcl) .... Take 1 tablet by mouth twice a day 7)  Plaquenil 200 Mg Tabs (Hydroxychloroquine sulfate) .... 2 tablet by mouth once a day 8)  Prednisone 20 Mg Tabs (Prednisone) .... Take 1 tablet by mouth once a day   Patient Instructions: 1)  Please schedule a follow-up appointment in 1 month. 2)  [phone] cell    ]

## 2010-07-24 NOTE — Progress Notes (Signed)
Summary: Refill  Phone Note Refill Request Call back at Home Phone [phone]   Refills Requested: Medication #1:  PREDNISONE 10 MG  TABS pt sts her rheumatologist normally prescribes this but was told since she gets her blood work done here we need to be filling it. pt goes to [facility]  Initial call taken by: Knox Royalty,  April 18, 2008 2:03 PM  Follow-up for Phone Call        fwd. to dr.jones for review Follow-up by: Arlyss Repress CMA,,  April 18, 2008 2:30 PM  Additional Follow-up for Phone Call Additional follow up Details #1::        I am hesitant to fill this medication as she is currently pregnant (and followed by high risk clinic) and followed by Dr. Phylliss Bob (rheumatologist) for her lupus.  I've never heard of a rheumatologist deferring monitoring of chronic prednisone use to a family physician.  Please call his office and see if pt is still following with him as he is also prescribing her plaquenil as well. Additional Follow-up by: Sylvan Cheese MD,  April 18, 2008 9:40 PM    Additional Follow-up for Phone Call Additional follow up Details #2::    called pt. pt demands to have dr.jones call her. fwd. to dr.jones. (i did explain your message to pt) Follow-up by: Arlyss Repress CMA,,  April 19, 2008 9:43 AM  Additional Follow-up for Phone Call Additional follow up Details #3:: Details for Additional Follow-up Action Taken: called, no answer.  left message for patient. Additional Follow-up by: Sylvan Cheese MD,  April 20, 2008 8:44 AM  New/Updated Medications: PREDNISONE 10 MG  TABS (PREDNISONE) 1 tablet by mouth daily- refills to come from Dr. Phylliss Bob pt called me at 10:20 in my office.  I just returned call and no answer. Sylvan Cheese 10:40am  Prescriptions: PREDNISONE 10 MG  TABS (PREDNISONE) 1 tablet by mouth daily- refills to come from Dr. Phylliss Bob  #31 x 0   Entered and Authorized by:   Sylvan Cheese MD   Signed by:   Sylvan Cheese MD on 04/20/2008   Method used:    Electronically to        [facility] [phone]* (retail)       [address] [place], [place]  [address]       Ph: [phone] or [phone]       Fax: [phone]   RxID:   [id number]

## 2010-07-24 NOTE — Progress Notes (Signed)
Summary: request to speak with md  Phone Note Call from Patient Call back at Home Phone [phone] Call back at [phone]   Reason for Call: Talk to Doctor Summary of Call: pt is requesting to speak with Dr. Sandria Manly, she sts she was suppose to call when her boyfriend received the results of some tests Initial call taken by: ERIN LEVAN,  May 14, 2007 2:57 PM  Follow-up for Phone Call        lft mssg for pt to call bck Follow-up by: Lillia Pauls CMA,  May 15, 2007 2:27 PM  Additional Follow-up for Phone Call Additional follow up Details #1::        pt is returning call Additional Follow-up by: ERIN LEVAN,  May 19, 2007 9:03 AM    Additional Follow-up for Phone Call Additional follow up Details #2::    Pt states we do need to set up appt at [facility] for a test.  She states fiance couldn't have test done. Follow-up by: Haydee Salter,  May 19, 2007 2:59 PM  Additional Follow-up for Phone Call Additional follow up Details #3:: Details for Additional Follow-up Action Taken: spoke with pt. pt's fiance must get test prior to procedure.   Additional Follow-up by: Wilhemina Bonito  MD,  May 25, 2007 2:00 PM

## 2010-07-24 NOTE — Progress Notes (Signed)
Summary: Rx Req  Phone Note Refill Request Call back at Home Phone [phone] Message from:  Patient  Refills Requested: Medication #1:  VICODIN HP 10-660 MG TABS 1 tablet every 4-6 hrs  for pain. PLEASE CALL PT WHEN READY TO PICK UP.  Initial call taken by: Clydell Hakim,  September 05, 2009 9:21 AM    Prescriptions: VICODIN HP 10-660 MG TABS (HYDROCODONE-ACETAMINOPHEN) 1 tablet every 4-6 hrs  for pain.  #120 x 2   Entered and Authorized by:   Helane Rima DO   Signed by:   Helane Rima DO on 09/06/2009   Method used:   Print then Give to Patient   RxID:   [id number]   Appended Document: Rx Req Pt notified that rx ready to be picked up.

## 2010-07-24 NOTE — Progress Notes (Signed)
Summary: WI request  Phone Note Call from Patient Call back at Home Phone [phone]   Reason for Call: Talk to Nurse Summary of Call: pt would like to be worked in for possible pink eye Initial call taken by: Haydee Salter,  September 11, 2006 10:12 AM  Follow-up for Phone Call        PHONE CALL TO PT STATES SHE HAS PINK EYE. 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 WAS TOLD EARLIER WHEN SHE INITIALLY CALLED THAT IT WOULD BE 1:30 BEFORE A WORK-IN TIME WOULD BE AVAILABLE SO PT IS CURRENTLY AT [facility] WAITING TO BE SEEN Follow-up by: Theresia Lo RN,  September 11, 2006 11:17 AM

## 2010-07-24 NOTE — Progress Notes (Signed)
Summary: Return call  Phone Note Call from Patient Call back at Home Phone [phone]   Reason for Call: Talk to Nurse Summary of Call: pt is returning call from [facility] - states she doesn't know what call is about Initial call taken by: Haydee Salter,  October 01, 2006 9:33 AM  Follow-up for Phone Call        Advised Whitney to tell pt I was calling in regards to a lost Rx for prenatal vitamins she phoned about. Just need to know does she still need a new script. Whitney advises she will give message to pt. Pt has appt with PCP this am and if this still an issue it can be addressed at this appt. Follow-up by: Dedra Skeens CMA,,  October 03, 2006 9:00 AM

## 2010-07-24 NOTE — Progress Notes (Signed)
Summary: refill  Phone Note Refill Request Call back at Home Phone [phone] Message from:  Patient  Refills Requested: Medication #1:  VICODIN HP 10-660 MG TABS 1 tablet every 4-6 hrs  for pain. [facility]  Initial call taken by: De Nurse,  January 25, 2009 4:35 PM    This patient usually requests Vicodin every 2-3 months. She was given #120 with 2 refills last month, so this request is too soon. Please advise the patient to come in for an appointment if she is having more pain than usual.   Appended Document: refill tried to call pt. number busy. please advise pt of dr.Vianny Schraeder's note, when she calls back

## 2010-07-24 NOTE — Progress Notes (Signed)
Summary: refill  Phone Note Refill Request Call back at Home Phone [phone] Message from:  Patient  Refills Requested: Medication #1:  VICODIN HP 10-660 MG TABS 1 tablet every 4-6 hrs  for pain. Initial call taken by: De Nurse,  March 15, 2010 2:28 PM  Follow-up for Phone Call        called pt lmom to return call. Rx is at front desk for pick up. Follow-up by: Tessie Fass CMA,  March 16, 2010 10:44 AM  Additional Follow-up for Phone Call Additional follow up Details #1::        pt called requesting that this Rx be called in to pharmacy. called back and lmom that this Rx could NOT be called in and she would have to come pick it up. Additional Follow-up by: Tessie Fass CMA,  March 16, 2010 10:51 AM    Prescriptions: VICODIN HP 10-660 MG TABS (HYDROCODONE-ACETAMINOPHEN) 1 tablet every 4-6 hrs  for pain.  #120 x 2   Entered and Authorized by:   Helane Rima DO   Signed by:   Helane Rima DO on 03/16/2010   Method used:   Print then Give to Patient   RxID:   [id number]  Printed and placed in RED TEAM box.

## 2010-07-24 NOTE — Progress Notes (Signed)
  Phone Note Refill Request Call back at [phone]   Refills Requested: Medication #1:  VICODIN HP 10-660 MG TABS 1 tablet every 4-6 hrs  for pain. Pt need refill for vicodan  Initial call taken by: Abundio Miu,  April 24, 2010 10:01 AM  Follow-up for Phone Call        Done. Follow-up by: Helane Rima DO,  April 24, 2010 10:07 AM    Prescriptions: VICODIN HP 10-660 MG TABS (HYDROCODONE-ACETAMINOPHEN) 1 tablet every 4-6 hrs  for pain.  #120 x 0   Entered and Authorized by:   Helane Rima DO   Signed by:   Helane Rima DO on 04/24/2010   Method used:   Print then Give to Patient   RxID:   [id number]

## 2010-07-24 NOTE — Miscellaneous (Signed)
Summary: PT SUMMARY  Clinical Lists Changes  Problems: Assessed HYPERTENSION, BENIGN SYSTEMIC as comment only - was well controlled on lisinopril but had to change because pt wanted to get pregnant.  Her updated medication list for this problem includes:    Hydralazine Hcl 50 Mg Tabs (Hydralazine hcl) .Marland Kitchen... 1 tablet four times a day for blood pressure.    Labetalol Hcl 300 Mg Tabs (Labetalol hcl) .Marland Kitchen... Take 1 tablet by mouth twice a day  Assessed AVASCULAR NECROSIS, FEMORAL HEAD as comment only - pt is on chronic vicodin for this, and has been on percocet and oxycontin in past which helped, but was a pain and medicaid denied her long term use of these.  pt has been stable on High Dose vicodin of  10/660 #120 per month.   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      osteoporosis prevention, on chronic steroids    hx of iron def anemia    HTN    Creatinine 0.9 ->1.06 9/08    Lupus 2003    child born in 1999      Past Surgical History:    ekg - 04/07/2003, iv iron prn - 11/03/2002       Impression & Recommendations:  Problem # 1:  AVASCULAR NECROSIS, FEMORAL HEAD (ICD-733.42) pt is on chronic vicodin for this, and has been on percocet and oxycontin in past which helped, but was a pain and medicaid denied her long  term use of these.  pt has been stable on High Dose vicodin of  10/660 #120 per month.    Problem # 2:  SLE (ICD-710.0) followed by dr truslowe Her updated medication list for this problem includes:    Prednisone 10 Mg Tabs (Prednisone)   Problem # 3:  HYPERTENSION, BENIGN SYSTEMIC (ICD-401.1) was well controlled on lisinopril but had to change because pt wanted to get pregnant.  Her updated medication list for this problem includes:    Hydralazine Hcl 50 Mg Tabs (Hydralazine hcl) .Marland Kitchen... 1 tablet four times a day for blood pressure.    Labetalol Hcl 300 Mg Tabs (Labetalol hcl) .Marland Kitchen... Take 1 tablet by mouth twice a day   Complete Medication List: 1)  Hydralazine Hcl 50 Mg Tabs (Hydralazine hcl) .Marland Kitchen.. 1 tablet four times a day for blood pressure. 2)  Labetalol Hcl 300 Mg Tabs (Labetalol hcl) .... Take 1 tablet by mouth twice a day 3)  Prednisone 10 Mg Tabs (Prednisone) 4)  Vicodin Hp 10-660 Mg Tabs (Hydrocodone-acetaminophen) .Marland Kitchen.. 1 tablet every 4-6 hrs  for pain.

## 2010-07-24 NOTE — Assessment & Plan Note (Signed)
Summary: htn, infertility, pain meds   Vital Signs:  Patient Profile:   30 Years Old Female Weight:      176 pounds Pulse rate:   83 / minute BP sitting:   156 / 93                 PCP:  MELISSA LOVE  MD  Chief Complaint:  fu htn, infertility, and pain.  History of Present Illness: 30 year old AAF with a history of lupus who presents for infertility x1 year.  Pt has been trying with fiance, Baldo Ash,  for the past year without success.  This fiance is different from father of her 60 year old son.  Fiance has no other children. pt is following up from previous GYN clinic visit.  Pt's partner, Baldo Ash, is scheduled to have the sperm count performed on 11/18 of this month.  Then, pt will fax over results and if all is normal, then she will be scheduled for HSG.  last period ws 11/4, and cycles every 28-29 days.  pt is having some nipple discharge.  clear, but this has happened before.    Pt is here today for follow up Hypertension.    HTN: pt states she has been eating alot of salty pork products which raises her bp.  Using medications: tolerating meds well even with 4 times a day dosing Chest Pain: no SOB:no HA:no Leg Edema:no Previous hx of CVA/TIA:no  Pt is here to follow up on pain issues.  Pt is scheduled to see Dr. Charlett Blake on 11/17 and Dr. Phylliss Bob on 24th of this month.  Pt states that her hip is bothering her some, especially today, but otherwise, the high-dose vicodin is doing very well.      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,        osteoporosis prevention, on chronic steroids       hx of iron def anemia       HTN       Creatinine 0.9 ->1.06 9/08       Lupus 2003       child born in 1999  Past Surgical History:    Reviewed history from 08/21/2006 and no changes required:       ekg - 04/07/2003, iv iron prn - 11/03/2002   Family History:    Reviewed history from 04/06/2007 and no  changes required:       noncontributory  Social History:    Reviewed history from 04/06/2007 and no changes required:       was working at a daycare, 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 ; lives with 32  year old son; has a supportive mother who works at [employer], Veterinary surgeon, different FOB than her 23 yr old son.       Physical Exam  General:     Well-developed,well-nourished,in no acute distress; alert,appropriate and cooperative throughout examination, gaining some weight.   Lungs:     Normal respiratory effort, chest expands symmetrically. Lungs are clear to auscultation, no crackles or wheezes. Heart:     Normal rate and regular rhythm. S1 and S2 normal without gallop, murmur, click, rub or other extra sounds. Abdomen:     Bowel sounds positive,abdomen soft and non-tender without masses, organomegaly or hernias noted. Msk:     hips  normal internal and external rotation from seated postion.  neg straight leg raise.  5/5 strength in bilateral lower extremities.     Impression & Recommendations:  Problem # 1:  INFERTILITY, FEMALE NOS (ICD-628.9) pt's fiance is to have sperm count done on 11/18th.  then they will fax over results to [facility].  If normal, then will refer pt to [facility] for HSG, to be performed a few days after her next period around Dec 15th or so.  Pt will call and give [place] an update if she has had her period yet or not.  Will continue to see monthly.  Will have [place] set up HSG once Sperm count is back.  added prenatal vitamins to list.   Orders: [facility]- Est  Level 4 (16109)   Problem # 2:  HYPERTENSION, BENIGN SYSTEMIC (ICD-401.1) Assessment: Improved Not at goal.  Will increase Hydralazine from 25mg  qid to 50mg  qid.  Will have pt refrain from salty foods such as pork.  Try no salt seasonings on food.  Will need to continue to increase hydralazine up until max dose and then if still needed add methyldopa.   Her updated  medication list for this problem includes:    Hydralazine Hcl 50 Mg Tabs (Hydralazine hcl) .Marland Kitchen... 1 tablet four times a day for blood pressure.    Labetalol Hcl 300 Mg Tabs (Labetalol hcl) .Marland Kitchen... 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  pt to see orthopedist on 11-17.  Her updated medication list for this problem includes:    D 1000 1000 Unit Caps (Cholecalciferol) .Marland Kitchen... Take 1 capsule by mouth once a day  Orders: [facility]- Est  Level 4 (09811)   Problem # 4:  SLE (ICD-710.0) stable.  pt to see Dr. Phylliss Bob for follow up and to see if she needs to be on any other meds for trying to get pregnant or for following up of labwork.   Her updated medication list for this problem includes:    Prednisone 20 Mg Tabs (Prednisone) .Marland Kitchen... Take 1 tablet by mouth once a day   Complete Medication List: 1)  D 1000 1000 Unit Caps (Cholecalciferol) .... Take 1 capsule by mouth once a day 2)  Folic Acid 1 Mg Tabs (Folic acid) .... Take 1 tablet by mouth once a day 3)  Hydralazine Hcl 50 Mg Tabs (Hydralazine hcl) .Marland Kitchen.. 1 tablet four times a day for blood pressure. 4)  Labetalol Hcl 300 Mg Tabs (Labetalol hcl) .... Take 1 tablet by mouth twice a day 5)  Prednisone 20 Mg Tabs (Prednisone) .... Take 1 tablet by mouth once a day 6)  Vicodin Hp 10-660 Mg Tabs (Hydrocodone-acetaminophen) .Marland Kitchen.. 1 tablet every 4-6 hrs  for pain. 7)  Pre-natal Tabs (Prenatal multivit-min-fe-fa) .Marland Kitchen.. 1 tablet a day, may substitute for whatever is on medicaid formulary   Patient Instructions: 1)  Call and fax with results from sperm count and if you have had your period.   2)  Then we will call to set up HSG at [facility] for probably mid dec. 3)  if you do not have your period by 10th of dec, take preg test and call with reults.   4)  Please schedule a follow-up appointment in 1  month.    Prescriptions: PRE-NATAL   TABS (PRENATAL MULTIVIT-MIN-FE-FA) 1 tablet a day, may substitute for whatever is on  medicaid formulary  #34 x 6   Entered and Authorized by:   Wilhemina Bonito  MD   Signed by:   Wilhemina Bonito  MD on 05/07/2007   Method used:   Print then Give to Patient   RxID:   [id number] VICODIN HP 10-660 MG TABS (HYDROCODONE-ACETAMINOPHEN) 1 tablet every 4-6 hrs  for pain.  #120 x 0   Entered and Authorized by:   Wilhemina Bonito  MD   Signed by:   Wilhemina Bonito  MD on 05/07/2007   Method used:   Print then Give to Patient   RxID:   [id number] HYDRALAZINE HCL 50 MG  TABS (HYDRALAZINE HCL) 1 tablet four times a day for blood pressure.  #120 x 6   Entered and Authorized by:   Wilhemina Bonito  MD   Signed by:   Wilhemina Bonito  MD on 05/07/2007   Method used:   Print then Give to Patient   RxID:   [email or web address]  [email or web address]  Appended Document: Orders Update Spoke with pt.  Pt's fiance' could not have test done at that testing center and is waiting on referral for another place.  Spoke with pt that he must have sperm count performed before she can have [place] hystersalpingogram to look for blocked fallopian tubes.  Pt understands.  Her fiance will call the office back to attempt to see what the hold up is.     Clinical Lists Changes  Orders: Added new Referral order of Gynecologic Referral (Gyn) - Signed

## 2010-07-24 NOTE — Assessment & Plan Note (Signed)
Summary: concerned about lesion on chest/bmc  DEPO GIVEN TODAY. NEXT DEPO DUE 06/06/10 THROUGH 06/20/10.Molly Maduro [facility] CMA  March 21, 2010 9:26 AM   Vital Signs:  Patient profile:   30 year old female Height:      64.5 inches Weight:      164 pounds BMI:     27.82 Temp:     98.4 degrees F oral Pulse rate:   78 / minute BP sitting:   125 / 84  (right arm) Cuff size:   regular  Vitals Entered By: Tessie Fass CMA (March 21, 2010 8:47 AM) CC: cyst on chest Is Patient Diabetic? No Pain Assessment Patient in pain? no        Primary Care Provider:  Helane Rima DO  CC:  cyst on chest.  History of Present Illness: 30 yo F:  1. Cyst: midchest, between breast, x several months, "popped and drained green stuff once," has not changed in size or shape, still feels hard, no drainage, no change in color. Wants reassurance. Does not want to have it removed unless it is harmful. No breast masses. No FamHx breast cancer.  Habits & Providers  Alcohol-Tobacco-Diet     Tobacco Status: never  Current Medications (verified): 1)  Labetalol Hcl 300 Mg Tabs (Labetalol Hcl) .... Take 1 Tablet By Mouth Twice A Day 2)  Plaquenil 200 Mg Tabs (Hydroxychloroquine Sulfate) .... One By Mouth Daily 3)  Vicodin Hp 10-660 Mg Tabs (Hydrocodone-Acetaminophen) .Marland Kitchen.. 1 Tablet Every 4-6 Hrs  For Pain. 4)  Prenatal 19  Tabs (Prenatal Vit-Dss-Fe Fum-Fa) .Marland Kitchen.. 1 Tablet By Mouth Daily 5)  Hydrochlorothiazide 25 Mg Tabs (Hydrochlorothiazide) .Marland Kitchen.. 1 Tablet By Mouth Daily For Blood Pressure 6)  Depo-Provera 150 Mg/ml Susp (Medroxyprogesterone Acetate) .... Q 3 Months Per Protocol  Allergies (verified): No Known Drug Allergies PMH-FH-SH reviewed for relevance  Review of Systems General:  Denies chills, fever, and weight loss. Derm:  Complains of lesion(s).  Physical Exam  General:  Well-developed, well-nourished, in no acute distress; alert, appropriate and cooperative throughout examination. Vitals  reviewed. Skin:  1. 0 x 0.5 cm, slightly darker than flesh-colored, firm, mobile, nontender nodule mid chest between breasts, no central pore identified.   Impression & Recommendations:  Problem # 1:  EPIDERMOID CYST (ICD-706.2) Assessment Unchanged  No red flags. Patient will hold off on removal unless she has red flags s/s. Will continue to monitor at OV.  Orders: [facility]- Est Level  3 (16109)  Complete Medication List: 1)  Labetalol Hcl 300 Mg Tabs (Labetalol hcl) .... Take 1 tablet by mouth twice a day 2)  Plaquenil 200 Mg Tabs (Hydroxychloroquine sulfate) .... One by mouth daily 3)  Vicodin Hp 10-660 Mg Tabs (Hydrocodone-acetaminophen) .Marland Kitchen.. 1 tablet every 4-6 hrs  for pain. 4)  Prenatal 19 Tabs (Prenatal vit-dss-fe fum-fa) .Marland Kitchen.. 1 tablet by mouth daily 5)  Hydrochlorothiazide 25 Mg Tabs (Hydrochlorothiazide) .Marland Kitchen.. 1 tablet by mouth daily for blood pressure 6)  Depo-provera 150 Mg/ml Susp (Medroxyprogesterone acetate) .... Q 3 months per protocol  Other Orders: Depo-Provera 150mg  (U0454)  Patient Instructions: 1)  It was nice to see you today! 2)  The bump on your chest is a benign cyst. If it changes size or shape, let me know.   Medication Administration  Injection # 1:    Medication: Depo-Provera 150mg     Diagnosis: CONTRACEPTIVE MANAGEMENT (ICD-V25.09)    Route: IM    Site: R deltoid    Exp Date: 08/2012    Lot #: [id number]  Mfr: greenstone    Comments: NEXT DEPO DUE 06-06-10 THROUGH 06-20-10.    Patient tolerated injection without complications    Given by: Tessie Fass CMA (March 21, 2010 9:25 AM)  Orders Added: 1)  Depo-Provera 150mg  [J1055] 2)  [facility]- Est Level  3 [52841]

## 2010-07-24 NOTE — Assessment & Plan Note (Signed)
Summary: tb test,tcb  Nurse Visit   Allergies: No Known Drug Allergies  Immunizations Administered:  PPD Skin Test:    Vaccine Type: PPD    Site: left forearm    Mfr: Sanofi Pasteur    Dose: 0.1 ml    Route: ID    Given by: Theresia Lo RN    Exp. Date: 11/19/2011    Lot #: [id number]  Orders Added: 1)  TB Skin Test [86580] 2)  Admin 1st Vaccine [phone]

## 2010-07-24 NOTE — Progress Notes (Signed)
Summary: vicodin refill  Phone Note Call from Patient   Caller: Patient Summary of Call: requesting refill on vicodin-uses [facility] [address] pt contact # is [phone] Initial call taken by: Eather Colas PATE CMA,,  January 07, 2008 1:33 PM  Follow-up for Phone Call        will forward message to MD. Follow-up by: Theresia Lo RN,  January 07, 2008 1:35 PM  Additional Follow-up for Phone Call Additional follow up Details #1::        I have sent 1 month supply of vicodin to her pharmacy.  Please call and advise absolutely NO MORE REFILLS until she has appointment to meet new MD. Additional Follow-up by: Sylvan Cheese MD,  January 07, 2008 1:45 PM    Additional Follow-up for Phone Call Additional follow up Details #2::    patient notified. Follow-up by: Theresia Lo RN,  January 07, 2008 2:10 PM    Prescriptions: VICODIN HP 10-660 MG TABS (HYDROCODONE-ACETAMINOPHEN) 1 tablet every 4-6 hrs  for pain.  #120 x 0   Entered and Authorized by:   Sylvan Cheese MD   Signed by:   Sylvan Cheese MD on 01/07/2008   Method used:   Faxed to ...       [facility] [facility] [phone]*       [address] [place], [place]  [address]       Ph: [phone] or [phone]       Fax: [phone]   RxID:   [id number]

## 2010-07-24 NOTE — Progress Notes (Signed)
Summary: Refill  Phone Note Call from Patient Call back at [phone]   Summary of Call: Pt is needing a refill on vicodin - [facility] on [address] Initial call taken by: Haydee Salter,  Nov 09, 2007 10:39 AM  Follow-up for Phone Call        will forward to Dr Sandria Manly Follow-up by: Alphia Kava,  Nov 09, 2007 12:00 PM      Prescriptions: VICODIN HP 10-660 MG TABS (HYDROCODONE-ACETAMINOPHEN) 1 tablet every 4-6 hrs  for pain.  #120 x 0   Entered and Authorized by:   Wilhemina Bonito  MD   Signed by:   Wilhemina Bonito  MD on 11/10/2007   Method used:   Printed then faxed to ...       [facility] [facility] [phone]*       [address] [place], [place]  [address]       Ph: [phone] or [phone]       Fax: [phone]   RxID:   [id number]

## 2010-07-24 NOTE — Assessment & Plan Note (Signed)
Summary: f/u bp,df   Vital Signs:  Patient profile:   30 year old female Height:      64.5 inches Weight:      176 pounds BMI:     29.85 Temp:     97.7 degrees F oral Pulse rate:   70 / minute BP sitting:   117 / 71  (left arm) Cuff size:   regular  Vitals Entered By: Tessie Fass CMA (August 02, 2009 4:52 PM) CC: F/U chronic issues, bump between breasts Is Patient Diabetic? No Pain Assessment Patient in pain? no        Primary Care Provider:  Helane Rima DO  CC:  F/U chronic issues and bump between breasts.  History of Present Illness: Angela Walls is a 30 year old AAF with lupus and bilateral avascular necrosis presenting today for follow-up of chronic issues and bump between breasts.  1. HTN: Taking Labetalol, HCTZ with no problems. Denies CP, SOB, N/V/D, HA, dizziness.  2. Lupus: followed by Dr. Azzie Roup.  Rx prednisone. Doing well.  3. Avascular necrosis of hips: Chronic right hip pain, followed by Dr. Charlett Blake. Has upcoming imaging of hips. Pain controlled on current regimen.  4. Skin: Bump between breasts, was a pimple a couple of months ago, she "popped" it, it drained green pus, then healed but has continued to remain a bump.      Habits & Providers  Alcohol-Tobacco-Diet     Tobacco Status: never  Current Medications (verified): 1)  Labetalol Hcl 300 Mg Tabs (Labetalol Hcl) .... Take 1 Tablet By Mouth Twice A Day 2)  Prednisone 10 Mg  Tabs (Prednisone) .Marland Kitchen.. 1 Tablet By Mouth Daily- Refills To Come From Dr. Phylliss Bob 3)  Vicodin Hp 10-660 Mg Tabs (Hydrocodone-Acetaminophen) .Marland Kitchen.. 1 Tablet Every 4-6 Hrs  For Pain. 4)  Prenatal 19  Tabs (Prenatal Vit-Dss-Fe Fum-Fa) .Marland Kitchen.. 1 Tablet By Mouth Daily 5)  Hydrochlorothiazide 25 Mg Tabs (Hydrochlorothiazide) .Marland Kitchen.. 1 Tablet By Mouth Daily For Blood Pressure  Allergies (verified): No Known Drug Allergies  Past History:  Past medical, surgical, family and social histories (including risk factors) reviewed for  relevance to current acute and chronic problems.  Past Medical History: Reviewed history from 05/08/2009 and no changes required. Avascular necrosis of hips, Dx 2006, on chronic pain meds with stable dose of vicodin, followed by Dr. Charlett Blake  Hx of iron deficiency anemia HTN Lupus, Dx 2003, followed by Dr. Dareen Piano [id number]- 1 vaginal birth (1999) and 1 c/s (2010).  Past Surgical History: Reviewed history from 05/08/2009 and no changes required. Appendix, 2000 LTCS due to NRFHT, May 2010  Family History: Reviewed history from 03/09/2008 and no changes required. Father - brain aneurysm Mother - anemia Brothers and Sisters - healthy Sons - healthy  Social History: Reviewed history from 05/08/2009 and no changes required. Lives with son Lanny Cramp) and newborn baby Nurse, learning disability). Has a supportive mother, fiance Margaretmary Eddy different FOB than her 54 yr old son. Stay-at-home mom. No alcohol, tobacco, or drugs.  Review of Systems General:  Denies chills and fever. CV:  Denies chest pain or discomfort, palpitations, and swelling of feet. Resp:  Denies cough and shortness of breath. Derm:  Complains of changes in color of skin and lesion(s); denies poor wound healing and rash.  Physical Exam  General:  Well-developed, well-nourished, in no acute distress; alert, appropriate and cooperative throughout examination. Vitals reviewed. Lungs:  Normal respiratory effort, chest expands symmetrically. Lungs are clear to auscultation, no crackles or wheezes.  Heart:  Normal rate and regular rhythm. S1 and S2 normal without gallop, murmur, click, rub or other extra sounds. Skin:  < 1 cm diameter, slightly darker than flesh-colored, firm, mobile, nontender nodule mid chest between breasts, no central pore identified. Psych:  Normally interactive, good eye contact, and not anxious appearing.     Impression & Recommendations:  Problem # 1:  HYPERTENSION, BENIGN SYSTEMIC (ICD-401.1) Assessment  Unchanged  Continue current regimen. Her updated medication list for this problem includes:    Labetalol Hcl 300 Mg Tabs (Labetalol hcl) .Marland Kitchen... Take 1 tablet by mouth twice a day    Hydrochlorothiazide 25 Mg Tabs (Hydrochlorothiazide) .Marland Kitchen... 1 tablet by mouth daily for blood pressure  Orders: [facility]- Est  Level 4 (99214)  Problem # 2:  SLE (ICD-710.0) Assessment: Unchanged  Continue current regimen. Her updated medication list for this problem includes:    Prednisone 10 Mg Tabs (Prednisone) .Marland Kitchen... 1 tablet by mouth daily- refills to come from dr. 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  Orders: [facility]- Est  Level 4 (60454)  Problem # 4:  EPIDERMOID CYST (ICD-706.2) Assessment: New  Discussed diagnosis and treatment options. Patient wants to do nothing at this time.  Orders: [facility]- Est  Level 4 (09811)  Complete Medication List: 1)  Labetalol Hcl 300 Mg Tabs (Labetalol hcl) .... Take 1 tablet by mouth twice a day 2)  Prednisone 10 Mg Tabs (Prednisone) .Marland Kitchen.. 1 tablet by mouth daily- refills to come from dr. Phylliss Bob 3)  Vicodin Hp 10-660 Mg Tabs (Hydrocodone-acetaminophen) .Marland Kitchen.. 1 tablet every 4-6 hrs  for pain. 4)  Prenatal 19 Tabs (Prenatal vit-dss-fe fum-fa) .Marland Kitchen.. 1 tablet by mouth daily 5)  Hydrochlorothiazide 25 Mg Tabs (Hydrochlorothiazide) .Marland Kitchen.. 1 tablet by mouth daily for blood pressure  Patient Instructions: 1)  It was nice to see you today! 2)  Come back in 3 months for a FULL PHYSICAL.  Prevention & Chronic Care Immunizations   Influenza vaccine: Fluvax 3+  (04/28/2007)   Influenza vaccine due: 04/27/2008    Tetanus booster: 06/25/1999: Done.   Tetanus booster due: 06/24/2009    Pneumococcal vaccine: Not documented  Other Screening   Pap smear: ATYPICAL SQUAMOUS CELLS OF UNDETERMINED SIGNIFICANCE  (03/01/2008)   Pap smear due: 12/2007   Smoking  status: never  (08/02/2009)  Hypertension   Last Blood Pressure: 117 / 71  (08/02/2009)   Serum creatinine: 0.86  (04/07/2008)   BMP action: Ordered   Serum potassium 3.7  (04/07/2008)    Hypertension flowsheet reviewed?: Yes   Progress toward BP goal: At goal  Self-Management Support :   Personal Goals (by the next clinic visit) :      Personal blood pressure goal: 140/90  (05/08/2009)   Patient will work on the following items until the next clinic visit to reach self-care goals:     Medications and monitoring: take my medicines every day  (08/02/2009)     Eating: drink diet soda or water instead of juice or soda, eat more vegetables, use fresh or frozen vegetables, eat foods that are low in salt, eat baked foods instead of fried foods, eat fruit for snacks and desserts, limit or avoid alcohol  (08/02/2009)    Hypertension self-management support: Written self-care plan  (08/02/2009)   Hypertension self-care plan printed.

## 2010-07-24 NOTE — Progress Notes (Signed)
Summary: refill  Phone Note Call from Patient Call back at [phone]   Caller: Patient Summary of Call: needs refill on Vicodin called to [facility] Initial call taken by: De Nurse,  May 05, 2008 3:29 PM  Follow-up for Phone Call        WILL FORWARD TO DR Yetta Barre Follow-up by: Alphia Kava,  May 05, 2008 3:58 PM      Appended Document: refill checking status  Appended Document: refill    Clinical Lists Changes spoke with Dr. Marice Potter at [facility] clinic.  They are not prescribing vicodin for this pt and she states it's ok for me to continue prescribing this medication through pregnancy. Sylvan Cheese MD  May 09, 2008 11:17 AM  Medications: Rx of VICODIN HP 10-660 MG TABS (HYDROCODONE-ACETAMINOPHEN) 1 tablet every 4-6 hrs  for pain.;  #120 x 2;  Signed;  Entered by: Sylvan Cheese MD;  Authorized by: Sylvan Cheese MD;  Method used: Printed then faxed to [facility] [phone]*, [address] [place], [place]  [address], Ph: [phone] or [phone], Fax: [phone]    Prescriptions: VICODIN HP 10-660 MG TABS (HYDROCODONE-ACETAMINOPHEN) 1 tablet every 4-6 hrs  for pain.  #120 x 2   Entered and Authorized by:   Sylvan Cheese MD   Signed by:   Sylvan Cheese MD on 05/09/2008   Method used:   Printed then faxed to ...       [facility]  [address] [facility] [phone]* (retail)       [address] [place], [place]  [address]       Ph: [phone] or [phone]       Fax: [phone]   RxID:   [id number]

## 2010-07-24 NOTE — Progress Notes (Signed)
Summary: Request to speak with MD  Phone Note Call from Patient Call back at Home Phone [phone]   Reason for Call: Talk to Doctor Summary of Call: pt is requesting to speak with MD re: her cycle, sts shes not having a normal cycle and this has been going on for 3 months, pt wants to be referred to womens or to a specialist to find out whats going on. Initial call taken by: ERIN LEVAN,  July 13, 2007 10:55 AM  Follow-up for Phone Call        Spoke with pt- she would like to be referred to gyn clinic because she says her periods are really irregular.  States for the past 3 months - she has barley even bled at all.  Last month she started after 21 days - very light spotting, this month she started after 21 days - and is barely spotting at all.  Denies any pain - just very minimal cramping.  Advised will send note to Dr. Sandria Manly to see if she needs to be seen before a referral is made - pt says she mentioned this problem last time she was here 06/29/07. Follow-up by: AMY MARTIN RN,  July 13, 2007 3:02 PM  Additional Follow-up for Phone Call Additional follow up Details #1::        I think it will be fine to refer to GYN clinic.  Additional Follow-up by: Wilhemina Bonito  MD,  July 14, 2007 8:58 AM  New Problems: DYSFUNCTIONAL UTERINE BLEEDING (ICD-626.8)   New Problems: DYSFUNCTIONAL UTERINE BLEEDING (ICD-626.8)  Spoke with pt- advised Dr. Sandria Manly will refer her to GYN clinic, and we will call her to inform her of appt info.  Faxed referral info to GYN clinic. ..................................................................Marland KitchenAMY MARTIN RN  July 16, 2007 12:06 PM

## 2010-07-24 NOTE — Miscellaneous (Signed)
Summary: Orders Update  Clinical Lists Changes  Orders: Added new Test order of Urine Culture-[facility] ([id number]) - Signed Added new Test order of HIV-[facility] ([id number]) - Signed Added new Test order of Sickle Cell Scr-[facility] ([id number]) - Signed

## 2010-07-24 NOTE — Progress Notes (Signed)
Summary: Rx  Phone Note Refill Request Message from:  Patient  Refills Requested: Medication #1:  VICODIN HP 10-660 MG TABS 1 tablet every 4-6 hrs  for pain.. Initial call taken by: Haydee Salter,  February 05, 2008 3:58 PM      Prescriptions: VICODIN HP 10-660 MG TABS (HYDROCODONE-ACETAMINOPHEN) 1 tablet every 4-6 hrs  for pain.  #120 x 3   Entered and Authorized by:   Sylvan Cheese MD   Signed by:   Sylvan Cheese MD on 02/05/2008   Method used:   Faxed to ...       [facility] [phone]*       [address] [place], [place]  [address]       Ph: [phone] or [phone]       Fax: [phone]   RxID:   [id number] VICODIN HP 10-660 MG TABS (HYDROCODONE-ACETAMINOPHEN) 1 tablet every 4-6 hrs  for pain.  #120 x 3   Entered and Authorized by:   Sylvan Cheese MD   Signed by:   Sylvan Cheese MD on 02/05/2008   Method used:   Electronically sent to ...       [facility] [phone]*       [address] [place], [place]  [address]       Ph: [phone] or [phone]       Fax: [phone]   RxID:   [id number]

## 2010-07-24 NOTE — Progress Notes (Signed)
Summary: Rx  Phone Note Refill Request Call back at [phone]   Refills Requested: Medication #1:  LABETALOL HCL 300 MG TABS Take 1 tablet by mouth twice a day called pharmacy for refill last week, needs called in before we close, all out - [facility] [address]  Initial call taken by: Haydee Salter,  May 30, 2008 2:11 PM      Prescriptions: LABETALOL HCL 300 MG TABS (LABETALOL HCL) Take 1 tablet by mouth twice a day  #62 x 3   Entered and Authorized by:   Sylvan Cheese MD   Signed by:   Sylvan Cheese MD on 05/31/2008   Method used:   Electronically to        [facility] [phone]* (retail)       [address] [place], [place]  [address]       Ph: [phone] or [phone]       Fax: [phone]   RxID:   [id number]

## 2010-07-24 NOTE — Letter (Signed)
Summary: Out of Work  [facility]  [address]   [place], [place] [address]   Phone: [phone]  Fax:     Nov 04, 2006   Employee:  Nigel Berthold D Nagele    To Whom It May Concern:   For Medical reasons, please excuse the above named employee from work for the following dates.  She was being seen in the office.:  Start:   11/04/06  End: 11/04/06    If you need additional information, please feel free to contact our office.         Sincerely,    MELISSA LOVE  MD

## 2010-07-24 NOTE — Letter (Signed)
Summary: Out of Work  [facility]  [address]   [place], [place] [address]   Phone: [phone]  Fax:     December 11, 2006   Employee:  Nigel Berthold D Landenberger    To Whom It May Concern:   For Medical reasons, please excuse the above named employee from work for the following dates:  Start: November 24, 2006  End:   December 11, 2006  If you need additional information, please feel free to contact our office.         Sincerely,    MELISSA LOVE  MD

## 2010-07-24 NOTE — Miscellaneous (Signed)
Summary: ZO:[id number]/EA  Clinical Lists Changes pt called and complained for not being called back in re: her appt. i gave the pt the appt and told her, that carol made the appt and maybe was not able to reach her. pt was really loud on the phone. wanted to know why we did not call back, i explained it again and pt hung up the phone.Marland KitchenMarland KitchenArlyss Repress CMA,  January 10, 2009 9:29 AM

## 2010-07-24 NOTE — Progress Notes (Signed)
Summary: phn msg  Phone Note Call from Patient   Caller: Patient Summary of Call: pt needs ref again since it has been a year for her to go see Lunette Stands. Initial call taken by: Clydell Hakim,  January 04, 2009 2:54 PM  Follow-up for Phone Call        Will forward to MD for referral. Follow-up by: Modesta Messing LPN,  January 04, 2009 5:24 PM  Additional Follow-up for Phone Call Additional follow up Details #1::        Who is Lunette Stands and why does she need the referral? Thanks! Additional Follow-up by: Helane Rima MD,  January 05, 2009 10:48 AM

## 2010-07-24 NOTE — Progress Notes (Signed)
Summary: Refill  Phone Note Refill Request Call back at Home Phone [phone] Message from:  Patient  Refills Requested: Medication #1:  VICODIN HP 10-660 MG TABS 1 tablet every 4-6 hrs  [facility]. [facility] [address]  Initial call taken by: Haydee Salter,  June 02, 2008 1:54 PM  Follow-up for Phone Call        I sent vicodin in November 12th with 2 refills. Pt should have refills on file at pharmacy.  Please investigate this. Follow-up by: Sylvan Cheese MD,  June 03, 2008 9:22 AM  Additional Follow-up for Phone Call Additional follow up Details #1::        lmom to call back Additional Follow-up by: Alphia Kava,  June 03, 2008 9:43 AM    Additional Follow-up for Phone Call Additional follow up Details #2::    pt will call the pharmacy Follow-up by: Alphia Kava,  June 03, 2008 12:11 PM

## 2010-07-24 NOTE — Miscellaneous (Signed)
Summary: [facility] APPT FAX  Clinical Lists Changes PT HAS APPT ON 03/24/2008 AT 8:00AM WITH [facility].  PLEASE NOTIFY PT OF APPT DATE AND TIME.  HARD COPY PLACED IN MD BOX FOR REVIEW    ............................DELORES PATE-GADDY,CMA Duncan Dull) March 11, 2008 9:12 AM  lm on machine at [phone]. All other numbers a mal answers and states this is the wrong number.  to notify patient.Alphia Kava  March 11, 2008 10:02 AM   message left on voicemail of cell phone number [phone] of appointment that has been scheduled at [facility]  for 03/24/08 at 8:00 AM. Theresia Lo RN  March 11, 2008 11:12 AM

## 2010-07-24 NOTE — Progress Notes (Signed)
Summary: requesting letter  Phone Note Call from Patient Call back at [phone]   Reason for Call: Talk to Doctor Summary of Call: pt is requesting to speak with someone about getting a letter stating her doctor took her out of work, needs the start date and why pt is out of work, pt needs this by next tuesday Initial call taken by: ERIN LEVAN,  May 08, 2007 10:35 AM  Follow-up for Phone Call        Do you know about this? Follow-up by: AMY MARTIN RN,  May 08, 2007 11:30 AM  Additional Follow-up for Phone Call Additional follow up Details #1::        no.  Pt asked to be out of work from june 2-19th of this year and was given a note for that time.  pt was given work restrictions of no heavy lifting of >20lbs on 10/03/06.  Pt decided to quit her job due to her pain in her hip.  Pt is going to see orthopedist later this month in about 4 days.  If pt needs out of work note or restrictions, all I can give her are the ones for the above dates.  If she has further restrictions, these need to be given by her orthopedist.   Additional Follow-up by: Wilhemina Bonito  MD,  May 08, 2007 12:15 PM    Additional Follow-up for Phone Call Additional follow up Details #2::    Phone call to pt - left voicemail to return my call.  Follow-up by: Jacki Cones RN,  May 08, 2007 5:43 PM  Additional Follow-up for Phone Call Additional follow up Details #3:: Details for Additional Follow-up Action Taken: Pt is returning call. 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  Left a work excuse for the dates June2nd- June 19th up front for her to pick-up per MD permission.  LMOVM for pt to pick it up, if pt needs something more specific the MD will have to write letter.  ...................................................................JESSICA [facility] CMA  May 11, 2007 5:19 PM    Appended Document: requesting letter pt is requesting to speak with someone re: the letter that was written, she sts it is not a letter going to her employer, its going to the lawyer, would like RN to call her back so she can explain.  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  States she is no longer working, so does not need a work excuse only a Physicist, medical stating the dates that Dr. Sandria Manly approved her to be out of work.  Pt thinks the deadline to turn this letter in has passed.  She will find out if the letter will still be accepted.  She will call back if the note is still needed.

## 2010-07-24 NOTE — Assessment & Plan Note (Signed)
Summary: DNKA               

## 2010-07-24 NOTE — Progress Notes (Signed)
Summary: Refill  Phone Note Call from Patient Call back at Home Phone [phone]   Reason for Call: Refill Medication Summary of Call: pt needs refill on pain medication - will be out in 2 days - wants to make sure Dr Dillard Essex puts everything on the rx so she doesn't have to call back Initial call taken by: Haydee Salter,  September 08, 2006 8:49 AM  Follow-up for Phone Call        pt is checking status, would like call back today to know what is going on Follow-up by: Haydee Salter,  September 09, 2006 8:46 AM  Additional Follow-up for Phone Call Additional follow up Details #1::        done, rx given to whitney for pt to pick up. Additional Follow-up by: Barth Kirks MD,  September 10, 2006 9:14 AM

## 2010-07-26 NOTE — Assessment & Plan Note (Addendum)
Summary: read ppd/kh  Nurse Visit   Allergies: No Known Drug Allergies  PPD Results    Date of reading: 07/06/2010    Results: < 5mm    Interpretation: negative  Appended Document: Orders Update    Clinical Lists Changes  Problems: Added new problem of SCREENING EXAMINATION FOR PULMONARY TUBERCULOSIS (ICD-V74.1) Orders: Added new Service order of No Charge Patient Arrived (NCPA0) (NCPA0) - Signed

## 2010-07-26 NOTE — Progress Notes (Signed)
Summary: UPDATE PROBLEM LIST   

## 2010-07-26 NOTE — Assessment & Plan Note (Signed)
Summary: lf hand pain & depo shot/bmc  DEPO GIVEN TODAY. NEXT DEPO DUE 08/31/2010 through 09/14/2010.Molly Maduro [facility] CMA  June 14, 2010 10:59 AM  Vital Signs:  Patient profile:   30 year old female Height:      64.5 inches Weight:      170 pounds BMI:     28.83 Temp:     98.1 degrees F oral Pulse rate:   74 / minute BP sitting:   147 / 95  (right arm) Cuff size:   regular  Vitals Entered By: Tessie Fass CMA (June 14, 2010 9:34 AM) CC: left arm arm pain Pain Assessment Patient in pain? yes     Location: left arm Intensity: 8   Primary Care Provider:  Helane Rima DO  CC:  left arm arm pain.  History of Present Illness: 30 yo F:  1. Wrist Pain: Left, x 3 weeks, worsening, hurts mostly at thumb with radiation to mid-arm, worse with movement, slightly better with pain meds. Has never had this before. Hx Lupus, on chronic prednisone.   Current Medications (verified): 1)  Labetalol Hcl 300 Mg Tabs (Labetalol Hcl) .... Take 1 Tablet By Mouth Twice A Day 2)  Plaquenil 200 Mg Tabs (Hydroxychloroquine Sulfate) .... One By Mouth Daily 3)  Vicodin Hp 10-660 Mg Tabs (Hydrocodone-Acetaminophen) .Marland Kitchen.. 1 Tablet Every 4-6 Hrs  For Pain. 4)  Prenatal 19  Tabs (Prenatal Vit-Dss-Fe Fum-Fa) .Marland Kitchen.. 1 Tablet By Mouth Daily 5)  Hydrochlorothiazide 25 Mg Tabs (Hydrochlorothiazide) .Marland Kitchen.. 1 Tablet By Mouth Daily For Blood Pressure 6)  Depo-Provera 150 Mg/ml Susp (Medroxyprogesterone Acetate) .... Q 3 Months Per Protocol 7)  Prednisone 20 Mg Tabs (Prednisone) .... One By Mouth Daily X 5 Days  Allergies (verified): No Known Drug Allergies PMH-FH-SH reviewed for relevance  Review of Systems      See HPI General:  Denies chills and fever. MS:  Complains of joint pain; denies joint redness and joint swelling. Derm:  Denies rash. Neuro:  Denies numbness, tingling, tremors, and weakness.  Physical Exam  General:  Well-developed, well-nourished, in no acute distress; alert, appropriate and  cooperative throughout examination. Vitals reviewed. Msk:  TTP along distal portion of radial styloid, along tendon. Slight TTP in snuff box. + finklestein - severe. No pain at [facility] joint. with no edema, redness, or crepitus. Pulses:  2+DP.   Impression & Recommendations:  Problem # 1:  THUMB PAIN, LEFT (ICD-729.5) Assessment New See #2, patient handout. Because patient has Lupus and Hx of avascular necrosis, will obtain XRAY and Rx Prednisone burst for pain control. Orders: Radiology other (Radiology Other) [facility]- Est  Level 4 ([address]253)  Problem # 2:  DE QUERVAIN'S TENOSYNOVITIS, LEFT WRIST (ICD-727.04) Assessment: New  Patient declines injection today. See patient handout.  Orders: [facility]- Est  Level 4 (66440)  Complete Medication List: 1)  Labetalol Hcl 300 Mg Tabs (Labetalol hcl) .... Take 1 tablet by mouth twice a day 2)  Plaquenil 200 Mg Tabs (Hydroxychloroquine sulfate) .... One by mouth daily 3)  Vicodin Hp 10-660 Mg Tabs (Hydrocodone-acetaminophen) .Marland Kitchen.. 1 tablet every 4-6 hrs  for pain. 4)  Prenatal 19 Tabs (Prenatal vit-dss-fe fum-fa) .Marland Kitchen.. 1 tablet by mouth daily 5)  Hydrochlorothiazide 25 Mg Tabs (Hydrochlorothiazide) .Marland Kitchen.. 1 tablet by mouth daily for blood pressure 6)  Depo-provera 150 Mg/ml Susp (Medroxyprogesterone acetate) .... Q 3 months per protocol 7)  Prednisone 20 Mg Tabs (Prednisone) .... One by mouth daily x 5 days  Other Orders: Depo-Provera 150mg  (H4742)  Patient Instructions:  1)  It was nice to see you today. 2)  Ice you wrist for 10 minutes three times a day. 3)  Continue Motrin for inflammation and as needed narcotics. 4)  Use a thumb spica splint. 5)  If it does not improve in 2 weeks, come back for an injection. Prescriptions: PREDNISONE 20 MG TABS (PREDNISONE) one by mouth daily x 5 days  #5 x 0   Entered and Authorized by:   Helane Rima DO   Signed by:   Helane Rima DO on 06/14/2010   Method used:   Electronically to        [facility]  [phone]* (retail)       [address] [place], [place]  [address]       Ph: [phone] or [phone]       Fax: [phone]   RxID:   [id number]    Medication Administration  Injection # 1:    Medication: Depo-Provera 150mg     Diagnosis: CONTRACEPTIVE MANAGEMENT (ICD-V25.09)    Route: IM    Site: R deltoid    Exp Date: 10/2012    Lot #: [id number]    Mfr: [email or web address]    Comments: NEXT DEPO DUE 08/31/2010 through 09/14/2010    Patient tolerated injection without complications    Given by: Tessie Fass CMA (June 14, 2010 10:54 AM)  Orders Added: 1)  Depo-Provera 150mg  [J1055] 2)  Radiology other [Radiology Other] 3)  [facility]- Est  Level 4 [47425]

## 2010-07-26 NOTE — Progress Notes (Signed)
  Phone Note Refill Request Call back at [phone]   Refills Requested: Medication #1:  VICODIN HP 10-660 MG TABS 1 tablet every 4-6 hrs  for pain. Initial call taken by: Abundio Miu,  June 19, 2010 2:31 PM  Follow-up for Phone Call        Handwritten. #120. Ready for pick-up. Follow-up by: Helane Rima DO,  June 19, 2010 2:44 PM

## 2010-07-26 NOTE — Assessment & Plan Note (Signed)
Summary: tb test/bmc  Nurse Visit   Allergies: No Known Drug Allergies  Immunizations Administered:  PPD Skin Test:    Vaccine Type: PPD    Site: left forearm    Mfr: Sanofi Pasteur    Dose: 0.1 ml    Route: ID    Given by: Golden Circle RN    Exp. Date: 02/23/2012    Lot #: [id number]  Orders Added: 1)  TB Skin Test [86580] 2)  Admin 1st Vaccine [90471] 3)  Est Level 1- [facility] [[address]41]

## 2010-08-01 NOTE — Progress Notes (Signed)
Summary: Rx  Phone Note Refill Request Call back at Home Phone [phone]   Refills Requested: Medication #1:  VICODIN HP 10-660 MG TABS 1 tablet every 4-6 hrs  for pain. Initial call taken by: Knox Royalty,  July 23, 2010 4:33 PM    Prescriptions: VICODIN HP 10-660 MG TABS (HYDROCODONE-ACETAMINOPHEN) 1 tablet every 4-6 hrs  for pain.  #120 x 0   Entered and Authorized by:   Helane Rima DO   Signed by:   Helane Rima DO on 07/23/2010   Method used:   Print then Give to Patient   RxID:   [id number]

## 2010-08-22 ENCOUNTER — Telehealth: Payer: Self-pay | Admitting: Family Medicine

## 2010-08-22 NOTE — Telephone Encounter (Signed)
Requesting refill on vicodin

## 2010-08-23 NOTE — Telephone Encounter (Signed)
Handwritten Rx done and will be placed in 'to be called' box on 3/2 am.

## 2010-09-06 ENCOUNTER — Ambulatory Visit: Payer: Self-pay | Admitting: Family Medicine

## 2010-09-07 ENCOUNTER — Telehealth: Payer: Self-pay | Admitting: Family Medicine

## 2010-09-07 NOTE — Telephone Encounter (Signed)
Needs refill on Prednisone [facility] is out

## 2010-09-07 NOTE — Telephone Encounter (Signed)
Patient calls stating she is on prednisone  daily from rheumatologist.  States Dr. Earlene Plater has given it to her before also. Paged Dr. Earlene Plater and she states she has not given Rx chronically. patient will need to get from rheumatologist. Patient notified.

## 2010-09-07 NOTE — Telephone Encounter (Addendum)
Refill request. Will forward to MD.  It appears from  previous notes that she was given this in Dec 2011 for 5 days . attempted calling patient . No answer.

## 2010-09-20 ENCOUNTER — Telehealth: Payer: Self-pay | Admitting: Family Medicine

## 2010-09-20 NOTE — Telephone Encounter (Signed)
Would like refill on Vicodin and needs to know results of xray

## 2010-09-21 ENCOUNTER — Other Ambulatory Visit: Payer: Self-pay | Admitting: Family Medicine

## 2010-09-21 DIAGNOSIS — M87059 Idiopathic aseptic necrosis of unspecified femur: Secondary | ICD-10-CM

## 2010-09-21 MED ORDER — HYDROCODONE-ACETAMINOPHEN 10-660 MG PO TABS: 1.0000 | ORAL_TABLET | Freq: Four times a day (QID) | ORAL | Status: DC | PRN

## 2010-09-21 NOTE — Telephone Encounter (Signed)
Rx printed and placed in box. Xray negative. Pain likely inflammation of a tendon.

## 2010-10-02 LAB — CBC
HCT: 23.6 % — ABNORMAL LOW (ref 36.0–46.0)
HCT: 24.7 % — ABNORMAL LOW (ref 36.0–46.0)
HCT: 26.7 % — ABNORMAL LOW (ref 36.0–46.0)
HCT: 30.7 % — ABNORMAL LOW (ref 36.0–46.0)
Hemoglobin: 10.5 g/dL — ABNORMAL LOW (ref 12.0–15.0)
Hemoglobin: 8.1 g/dL — ABNORMAL LOW (ref 12.0–15.0)
Hemoglobin: 8.5 g/dL — ABNORMAL LOW (ref 12.0–15.0)
Hemoglobin: 9.3 g/dL — ABNORMAL LOW (ref 12.0–15.0)
MCHC: 34.3 g/dL (ref 30.0–36.0)
MCHC: 34.3 g/dL (ref 30.0–36.0)
MCHC: 34.5 g/dL (ref 30.0–36.0)
MCHC: 34.9 g/dL (ref 30.0–36.0)
MCV: 92.8 fL (ref 78.0–100.0)
MCV: 92.9 fL (ref 78.0–100.0)
MCV: 93 fL (ref 78.0–100.0)
MCV: 94.4 fL (ref 78.0–100.0)
Platelets: 226 10*3/uL (ref 150–400)
Platelets: 367 10*3/uL (ref 150–400)
Platelets: 370 10*3/uL (ref 150–400)
Platelets: 390 10*3/uL (ref 150–400)
RBC: 2.5 MIL/uL — ABNORMAL LOW (ref 3.87–5.11)
RBC: 2.66 MIL/uL — ABNORMAL LOW (ref 3.87–5.11)
RBC: 2.87 MIL/uL — ABNORMAL LOW (ref 3.87–5.11)
RBC: 3.3 MIL/uL — ABNORMAL LOW (ref 3.87–5.11)
RDW: 14.6 % (ref 11.5–15.5)
RDW: 14.6 % (ref 11.5–15.5)
RDW: 14.7 % (ref 11.5–15.5)
RDW: 14.8 % (ref 11.5–15.5)
WBC: 12.7 10*3/uL — ABNORMAL HIGH (ref 4.0–10.5)
WBC: 12.8 10*3/uL — ABNORMAL HIGH (ref 4.0–10.5)
WBC: 17.9 10*3/uL — ABNORMAL HIGH (ref 4.0–10.5)
WBC: 18.5 10*3/uL — ABNORMAL HIGH (ref 4.0–10.5)

## 2010-10-02 LAB — STOOL CULTURE

## 2010-10-02 LAB — DIFFERENTIAL
Basophils Absolute: 0 10*3/uL (ref 0.0–0.1)
Basophils Absolute: 0 10*3/uL (ref 0.0–0.1)
Basophils Absolute: 0.2 10*3/uL — ABNORMAL HIGH (ref 0.0–0.1)
Basophils Relative: 0 % (ref 0–1)
Basophils Relative: 0 % (ref 0–1)
Basophils Relative: 1 % (ref 0–1)
Eosinophils Absolute: 0 10*3/uL (ref 0.0–0.7)
Eosinophils Absolute: 0.1 10*3/uL (ref 0.0–0.7)
Eosinophils Absolute: 0.1 10*3/uL (ref 0.0–0.7)
Eosinophils Relative: 0 % (ref 0–5)
Eosinophils Relative: 1 % (ref 0–5)
Eosinophils Relative: 1 % (ref 0–5)
Lymphocytes Relative: 7 % — ABNORMAL LOW (ref 12–46)
Lymphocytes Relative: 9 % — ABNORMAL LOW (ref 12–46)
Lymphocytes Relative: 9 % — ABNORMAL LOW (ref 12–46)
Lymphs Abs: 1 10*3/uL (ref 0.7–4.0)
Lymphs Abs: 1.1 10*3/uL (ref 0.7–4.0)
Lymphs Abs: 1.7 10*3/uL (ref 0.7–4.0)
Monocytes Absolute: 0.6 10*3/uL (ref 0.1–1.0)
Monocytes Absolute: 0.8 10*3/uL (ref 0.1–1.0)
Monocytes Absolute: 1.1 10*3/uL — ABNORMAL HIGH (ref 0.1–1.0)
Monocytes Relative: 3 % (ref 3–12)
Monocytes Relative: 6 % (ref 3–12)
Monocytes Relative: 8 % (ref 3–12)
Neutro Abs: 10.8 10*3/uL — ABNORMAL HIGH (ref 1.7–7.7)
Neutro Abs: 10.8 10*3/uL — ABNORMAL HIGH (ref 1.7–7.7)
Neutro Abs: 16 10*3/uL — ABNORMAL HIGH (ref 1.7–7.7)
Neutrophils Relative %: 83 % — ABNORMAL HIGH (ref 43–77)
Neutrophils Relative %: 85 % — ABNORMAL HIGH (ref 43–77)
Neutrophils Relative %: 87 % — ABNORMAL HIGH (ref 43–77)
WBC Morphology: INCREASED

## 2010-10-02 LAB — URINE CULTURE
Colony Count: >=100000
Special Requests: POSITIVE

## 2010-10-02 LAB — BASIC METABOLIC PANEL
BUN: 12 mg/dL (ref 6–23)
CO2: 24 mEq/L (ref 19–32)
Calcium: 8 mg/dL — ABNORMAL LOW (ref 8.4–10.5)
Chloride: 109 mEq/L (ref 96–112)
Creatinine, Ser: 0.97 mg/dL (ref 0.4–1.2)
GFR calc Af Amer: >60 mL/min (ref >60)
GFR calc non Af Amer: >60 mL/min (ref >60)
Glucose, Bld: 94 mg/dL (ref 70–99)
Potassium: 3.7 mEq/L (ref 3.5–5.1)
Sodium: 139 mEq/L (ref 135–145)

## 2010-10-02 LAB — CLOSTRIDIUM DIFFICILE EIA: C difficile Toxins A+B, EIA: NEGATIVE

## 2010-10-02 LAB — CULTURE, BLOOD (ROUTINE X 2)
Culture: NO GROWTH
Culture: NO GROWTH

## 2010-10-02 LAB — URINALYSIS, MICROSCOPIC ONLY
Bilirubin Urine: NEGATIVE
Glucose, UA: NEGATIVE mg/dL
Ketones, ur: NEGATIVE mg/dL
Nitrite: NEGATIVE
Protein, ur: NEGATIVE mg/dL
Specific Gravity, Urine: <1.005 — ABNORMAL LOW (ref 1.005–1.030)
Urobilinogen, UA: 0.2 mg/dL (ref 0.0–1.0)
pH: 6.5 (ref 5.0–8.0)

## 2010-10-02 LAB — COMPREHENSIVE METABOLIC PANEL
ALT: 17 U/L (ref 0–35)
AST: 16 U/L (ref 0–37)
Albumin: 2.7 g/dL — ABNORMAL LOW (ref 3.5–5.2)
Alkaline Phosphatase: 130 U/L — ABNORMAL HIGH (ref 39–117)
BUN: 13 mg/dL (ref 6–23)
CO2: 24 mEq/L (ref 19–32)
Calcium: 8.6 mg/dL (ref 8.4–10.5)
Chloride: 102 mEq/L (ref 96–112)
Creatinine, Ser: 0.9 mg/dL (ref 0.4–1.2)
GFR calc Af Amer: >60 mL/min (ref >60)
GFR calc non Af Amer: >60 mL/min (ref >60)
Glucose, Bld: 90 mg/dL (ref 70–99)
Potassium: 4 mEq/L (ref 3.5–5.1)
Sodium: 137 mEq/L (ref 135–145)
Total Bilirubin: 0.5 mg/dL (ref 0.3–1.2)
Total Protein: 5.9 g/dL — ABNORMAL LOW (ref 6.0–8.3)

## 2010-10-02 LAB — OVA AND PARASITE EXAMINATION: Ova and parasites: NONE SEEN

## 2010-10-03 LAB — POCT URINALYSIS DIP (DEVICE)
Bilirubin Urine: NEGATIVE
Bilirubin Urine: NEGATIVE
Bilirubin Urine: NEGATIVE
Bilirubin Urine: NEGATIVE
Glucose, UA: NEGATIVE mg/dL
Glucose, UA: NEGATIVE mg/dL
Glucose, UA: NEGATIVE mg/dL
Glucose, UA: NEGATIVE mg/dL
Hgb urine dipstick: NEGATIVE
Hgb urine dipstick: NEGATIVE
Hgb urine dipstick: NEGATIVE
Hgb urine dipstick: NEGATIVE
Ketones, ur: NEGATIVE mg/dL
Ketones, ur: NEGATIVE mg/dL
Ketones, ur: NEGATIVE mg/dL
Ketones, ur: NEGATIVE mg/dL
Nitrite: NEGATIVE
Nitrite: NEGATIVE
Nitrite: NEGATIVE
Nitrite: NEGATIVE
Protein, ur: 30 mg/dL — AB
Protein, ur: NEGATIVE mg/dL
Protein, ur: NEGATIVE mg/dL
Protein, ur: NEGATIVE mg/dL
Specific Gravity, Urine: 1.01 (ref 1.005–1.030)
Specific Gravity, Urine: 1.025 (ref 1.005–1.030)
Specific Gravity, Urine: 1.02 (ref 1.005–1.030)
Specific Gravity, Urine: 1.02 (ref 1.005–1.030)
Urobilinogen, UA: 0.2 mg/dL (ref 0.0–1.0)
Urobilinogen, UA: 0.2 mg/dL (ref 0.0–1.0)
Urobilinogen, UA: 0.2 mg/dL (ref 0.0–1.0)
Urobilinogen, UA: 0.2 mg/dL (ref 0.0–1.0)
pH: 6 (ref 5.0–8.0)
pH: 6.5 (ref 5.0–8.0)
pH: 6 (ref 5.0–8.0)
pH: 6.5 (ref 5.0–8.0)

## 2010-10-03 LAB — CBC
HCT: 28 % — ABNORMAL LOW (ref 36.0–46.0)
Hemoglobin: 9.8 g/dL — ABNORMAL LOW (ref 12.0–15.0)
MCHC: 35.1 g/dL (ref 30.0–36.0)
MCV: 92.4 fL (ref 78.0–100.0)
Platelets: 256 10*3/uL (ref 150–400)
RBC: 3.03 MIL/uL — ABNORMAL LOW (ref 3.87–5.11)
RDW: 14.9 % (ref 11.5–15.5)
WBC: 9.1 10*3/uL (ref 4.0–10.5)

## 2010-10-03 LAB — COMPREHENSIVE METABOLIC PANEL
ALT: 13 U/L (ref 0–35)
AST: 17 U/L (ref 0–37)
Albumin: 2.7 g/dL — ABNORMAL LOW (ref 3.5–5.2)
Alkaline Phosphatase: 224 U/L — ABNORMAL HIGH (ref 39–117)
BUN: 7 mg/dL (ref 6–23)
CO2: 21 mEq/L (ref 19–32)
Calcium: 8.3 mg/dL — ABNORMAL LOW (ref 8.4–10.5)
Chloride: 109 mEq/L (ref 96–112)
Creatinine, Ser: 0.74 mg/dL (ref 0.4–1.2)
GFR calc Af Amer: >60 mL/min (ref >60)
GFR calc non Af Amer: >60 mL/min (ref >60)
Glucose, Bld: 73 mg/dL (ref 70–99)
Potassium: 3.7 mEq/L (ref 3.5–5.1)
Sodium: 136 mEq/L (ref 135–145)
Total Bilirubin: 0.4 mg/dL (ref 0.3–1.2)
Total Protein: 5.7 g/dL — ABNORMAL LOW (ref 6.0–8.3)

## 2010-10-03 LAB — RPR: RPR Ser Ql: NONREACTIVE

## 2010-10-04 LAB — POCT URINALYSIS DIP (DEVICE)
Bilirubin Urine: NEGATIVE
Bilirubin Urine: NEGATIVE
Glucose, UA: NEGATIVE mg/dL
Glucose, UA: NEGATIVE mg/dL
Hgb urine dipstick: NEGATIVE
Ketones, ur: NEGATIVE mg/dL
Ketones, ur: NEGATIVE mg/dL
Nitrite: NEGATIVE
Nitrite: NEGATIVE
Protein, ur: 30 mg/dL — AB
Protein, ur: 30 mg/dL — AB
Specific Gravity, Urine: 1.02 (ref 1.005–1.030)
Specific Gravity, Urine: 1.02 (ref 1.005–1.030)
Urobilinogen, UA: 0.2 mg/dL (ref 0.0–1.0)
Urobilinogen, UA: 0.2 mg/dL (ref 0.0–1.0)
pH: 6 (ref 5.0–8.0)
pH: 7 (ref 5.0–8.0)

## 2010-10-08 LAB — URINALYSIS, ROUTINE W REFLEX MICROSCOPIC
Bilirubin Urine: NEGATIVE
Glucose, UA: NEGATIVE mg/dL
Hgb urine dipstick: NEGATIVE
Ketones, ur: 15 mg/dL — AB
Nitrite: NEGATIVE
Protein, ur: NEGATIVE mg/dL
Specific Gravity, Urine: 1.02 (ref 1.005–1.030)
Urobilinogen, UA: 0.2 mg/dL (ref 0.0–1.0)
pH: 7.5 (ref 5.0–8.0)

## 2010-10-08 LAB — POCT URINALYSIS DIP (DEVICE)
Bilirubin Urine: NEGATIVE
Glucose, UA: NEGATIVE mg/dL
Hgb urine dipstick: NEGATIVE
Ketones, ur: NEGATIVE mg/dL
Nitrite: NEGATIVE
Protein, ur: 30 mg/dL — AB
Specific Gravity, Urine: 1.025 (ref 1.005–1.030)
Urobilinogen, UA: 0.2 mg/dL (ref 0.0–1.0)
pH: 5.5 (ref 5.0–8.0)

## 2010-10-09 LAB — POCT URINALYSIS DIP (DEVICE)
Bilirubin Urine: NEGATIVE
Bilirubin Urine: NEGATIVE
Glucose, UA: NEGATIVE mg/dL
Glucose, UA: NEGATIVE mg/dL
Hgb urine dipstick: NEGATIVE
Ketones, ur: NEGATIVE mg/dL
Ketones, ur: NEGATIVE mg/dL
Nitrite: NEGATIVE
Nitrite: NEGATIVE
Protein, ur: NEGATIVE mg/dL
Protein, ur: NEGATIVE mg/dL
Specific Gravity, Urine: 1.01 (ref 1.005–1.030)
Specific Gravity, Urine: 1.01 (ref 1.005–1.030)
Urobilinogen, UA: 0.2 mg/dL (ref 0.0–1.0)
Urobilinogen, UA: 0.2 mg/dL (ref 0.0–1.0)
pH: 6.5 (ref 5.0–8.0)
pH: 6 (ref 5.0–8.0)

## 2010-10-10 ENCOUNTER — Ambulatory Visit (INDEPENDENT_AMBULATORY_CARE_PROVIDER_SITE_OTHER): Payer: Medicaid Other | Admitting: *Deleted

## 2010-10-10 DIAGNOSIS — Z309 Encounter for contraceptive management, unspecified: Secondary | ICD-10-CM

## 2010-10-10 LAB — POCT URINE PREGNANCY: Preg Test, Ur: NEGATIVE

## 2010-10-10 MED ORDER — MEDROXYPROGESTERONE ACETATE 150 MG/ML IM SUSP
150.0000 mg | Freq: Once | INTRAMUSCULAR | Status: AC
  Administered 2010-10-10: 150 mg via INTRAMUSCULAR

## 2010-10-16 ENCOUNTER — Telehealth: Payer: Self-pay | Admitting: Family Medicine

## 2010-10-16 NOTE — Telephone Encounter (Signed)
Need rx for Vicodin.  Call and let know when ready.

## 2010-10-17 ENCOUNTER — Other Ambulatory Visit: Payer: Self-pay | Admitting: Family Medicine

## 2010-10-17 DIAGNOSIS — M87059 Idiopathic aseptic necrosis of unspecified femur: Secondary | ICD-10-CM

## 2010-10-17 MED ORDER — HYDROCODONE-ACETAMINOPHEN 10-660 MG PO TABS: 1.0000 | ORAL_TABLET | Freq: Four times a day (QID) | ORAL | Status: DC | PRN

## 2010-10-17 NOTE — Telephone Encounter (Signed)
Completed.

## 2010-10-18 ENCOUNTER — Telehealth: Payer: Self-pay | Admitting: Family Medicine

## 2010-10-18 ENCOUNTER — Encounter: Payer: Self-pay | Admitting: Family Medicine

## 2010-10-18 ENCOUNTER — Ambulatory Visit (INDEPENDENT_AMBULATORY_CARE_PROVIDER_SITE_OTHER): Payer: Medicaid Other | Admitting: Family Medicine

## 2010-10-18 VITALS — BP 140/80 | HR 80 | Temp 97.9°F | Ht 66.0 in | Wt 168.0 lb

## 2010-10-18 DIAGNOSIS — M654 Radial styloid tenosynovitis [de Quervain]: Secondary | ICD-10-CM

## 2010-10-18 MED ORDER — DICLOFENAC SODIUM 1 % TD GEL: 1.0000 "application " | Freq: Four times a day (QID) | TRANSDERMAL | Status: DC

## 2010-10-18 NOTE — Telephone Encounter (Signed)
Pt asking to speak with MD re: her rx for hydrocodone, rx says not to fill until 10/22/10. Pt says the rx is only suppose to last her 20 days but it will last until maybe 4/28. Pt cant wait until 4/30. Would like MD to call & explain.

## 2010-10-18 NOTE — Progress Notes (Signed)
  Subjective:    Patient ID: Angela Walls, female    DOB: 1981-05-04, 30 y.o.   MRN: [id number]  HPI  1. Tommi Rumps Quervain's: Left, x 3 months, unchanged, hurts mostly at thumb with radiation to mid-arm, worse with movement, slightly better with pain meds. Hx Lupus, on chronic prednisone. Has improved only slightly with brace and previous prednisone burst. Declines injection.  Review of Systems General:  Denies chills and fever. MS:  Complains of joint pain; denies joint redness and joint swelling. Derm:  Denies rash. Neuro:  Denies numbness, tingling, tremors, and weakness.    Objective:   Physical Exam General:  Well-developed, well-nourished, in no acute distress; alert, appropriate and cooperative throughout examination. Vitals reviewed. Msk:  TTP along distal portion of radial styloid, along tendon. Slight TTP in snuff box. + finklestein - severe. No pain at [facility] joint. with no edema, redness, or crepitus. Pulses:  2+DP.   Assessment & Plan:

## 2010-10-18 NOTE — Patient Instructions (Signed)
De Quervain's Tenosynovitis   De Quervain's tenosynovitis involves inflammation of one or two tendon linings (sheaths) or strain of one or two tendons to the thumb: extensor pollicis brevis (EPB), or abductor pollicis longus (APL). This causes pain on the side of the wrist and base of the thumb. Tendon sheaths secrete a fluid that lubricates the tendon, allowing the tendon to move smoothly. When the sheath becomes inflamed, the tendon cannot move freely in the sheath. Both the EPB and APL tendons are important for proper use of the hand. The EPB tendon is important for straightening the thumb. The APL tendon is important for moving the thumb away from the index finger (abducting). The two tendons pass through a small tube (canal) in the wrist, near the base of the thumb. When the tendons become inflamed, pain is usually felt in this area.   SYMPTOMS  Pain, tenderness, swelling, warmth, or redness over the base of the thumb and thumb side of the wrist.  Pain that gets worse when straightening the thumb.  Pain that gets worse when moving the thumb away from the index finger, against resistance.  Pain with pinching or gripping.  Locking or catching of the thumb.  Limited motion of the thumb.  Crackling sound (crepitation) when the tendon or thumb is moved or touched.  Fluid-filled cyst in the area of the base of the thumb.   CAUSES   Tenosynovitis is often linked with overuse of the wrist.  Tenosynovitis may be caused by repeated injury to the thumb muscle and tendon units, and with repeated motions of the hand and wrist, due to friction of the tendon within the lining (sheath).  Tenosynovitis may also be due to a sudden increase in activity or change in activity.   RISK INCREASES WITH   Sports that involve repeated hand and wrist motions (golf, bowling, tennis, squash, racquetball).  Heavy labor.  Poor physical wrist strength and flexibility.  Failure to warm up properly before  practice or play.  Female gender.  New mothers who hold their baby's head for long periods or lift infants with thumbs in the infant's armpit (axilla).   PREVENTIVE MEASURES   Warm up and stretch properly before practice or competition.   Allow enough time for rest and recovery between practices and competition.  Maintain appropriate conditioning: l Cardiovascular fitness. l Forearm, wrist, and hand flexibility. l Muscle strength and endurance.  Use proper exercise technique.   PROGNOSIS This condition is usually curable within 6 weeks, if treated properly with non-surgical treatment and resting of the affected area.    POSSIBLE COMPLICATIONS   Longer healing time if not properly treated or if not given enough time to heal.  Chronic inflammation, causing recurring symptoms of tenosynovitis. Permanent pain or restriction of movement.  Risks of surgery: infection, bleeding, injury to nerves (numbness of the thumb), continued pain, incomplete release of the tendon sheath, recurring symptoms, cutting of the tendons, tendons sliding out of position, weakness of the thumb, thumb stiffness.   GENERAL TREATMENT CONSIDERATIONS  First, treatment involves the use of medicine and ice, to reduce pain and inflammation. Patients are encouraged to stop or modify activities that aggravate the injury. Stretching and strengthening exercises may be advised. Exercises may be completed at home or with a therapist. You may be fitted with a brace or splint, to limit motion and allow the injury to heal. Your caregiver may also choose to give you a corticosteroid injection, to reduce the pain and inflammation. If non-surgical  treatment is not successful, surgery may be needed. Most tenosynovitis surgeries are done as outpatient procedures (you go home the same day). Surgery may involve local, regional (whole arm), or general anesthesia.    MEDICATION   If pain medicine is needed, nonsteroidal  anti-inflammatory medicines (aspirin and ibuprofen), or other minor pain relievers (acetaminophen), are often advised.   Do not take pain medicine for 7 days before surgery.   Prescription pain relievers are often prescribed only after surgery. Use only as directed and only as much as you need.  Corticosteroid injections may be given if your caregiver thinks they are needed. There is a limited number of times these injections may be given.   COLD THERAPY   Cold treatment (icing) should be applied for 10 to 15 minutes every 2 to 3 hours for inflammation and pain, and immediately after activity that aggravates your symptoms. Use ice packs or an ice massage.   SEEK MEDICAL CARE IF:   Symptoms get worse or do not improve in 2 to 4 weeks, despite treatment.  You experience pain, numbness, or coldness in the hand.  Blue, gray, or dark color appears in the fingernails.  Any of the following occur after surgery: increased pain, swelling, redness, drainage of fluids, bleeding in the affected area, or signs of infection.  New, unexplained symptoms develop. (Drugs used in treatment may produce side effects.)   Document Released: 06/10/2005  Document Re-Released: 04/07/2009 [facility] The Patient Information 2011 [place], [place].

## 2010-10-18 NOTE — Telephone Encounter (Signed)
Forward to Wallace 

## 2010-10-18 NOTE — Assessment & Plan Note (Addendum)
Discussed Dx and management again. Patient adamantly denies injection. Will add Voltaren cream today. Follow up in 4-6 weeks if not improving.

## 2010-10-22 NOTE — Telephone Encounter (Signed)
Spoke to [employer]. Okay to fill 4/28. States that he will call pharmacy.

## 2010-10-31 ENCOUNTER — Telehealth: Payer: Self-pay | Admitting: Family Medicine

## 2010-10-31 NOTE — Telephone Encounter (Signed)
ahas called all over and there is not a pharmacy that the Voltaren cream - wants to know if she can get prednisone again since that helped.  [facility] [place]

## 2010-11-02 NOTE — Telephone Encounter (Signed)
Called patient, says she already has an appointment with [facility].Kaity Pitstick, [name]

## 2010-11-02 NOTE — Telephone Encounter (Signed)
Pt calling back, wants to speak with Molly Maduro

## 2010-11-02 NOTE — Telephone Encounter (Signed)
Called and left message for patient to return call. Please see message below form Dr Earlene Plater.Tomaz Janis, [name]

## 2010-11-02 NOTE — Telephone Encounter (Signed)
I do not think that it is a good idea to do another burst. I recommend that she make an [facility] (they can use ultrasound if that makes her feel more comfortable).

## 2010-11-06 NOTE — Op Note (Signed)
NAME:  Angela Walls, Angela Walls                ACCOUNT NO.:  [id number]   MEDICAL RECORD NO.:  [id number]          PATIENT TYPE:  INP   LOCATION:  9306                          FACILITY:  [facility]   PHYSICIAN:  Phil D. Okey Dupre, M.D.     DATE OF BIRTH:  May 21, 1981   DATE OF PROCEDURE:  09/11/2007  DATE OF DISCHARGE:                               OPERATIVE REPORT   PROCEDURE:  Dilatation evacuation.   PREOPERATIVE DIAGNOSIS:  Missed abortion.   POSTOPERATIVE DIAGNOSIS:  Missed abortion.   SURGEON:  Dr. Okey Dupre.   ANESTHESIA:  General.   ESTIMATED BLOOD LOSS:  Minimal.   POSTOPERATIVE CONDITION:  Satisfactory.   SPECIMENS TO PATHOLOGY:  Products of conception.   OPERATIVE FINDINGS:  The external genitalia normal.  The introitus  showed a transverse pinna muscle that was extraordinarily tight making  visualization of the cervix very difficult.  Weighted speculum was  finally placed into the vagina to the posterior fourchette, once  constant pressure was used to try and stretch the perineum.  The cervix  was anterior with a retroverted uterus of about six weeks gestational  size.  Vagina was clean and well rugated.  The cervix was nulliparous.  The anterior lip of the cervix was grasped with a single-tooth  tenaculum.  The uterine cavity sounded to a depth of 8 cm, cervical os  dilated to a #9 Hegar dilator.  A #8 suction curette was used to  evacuate the uterine contents without incident.  Tenaculum and speculum  removed from the vagina.  After, a figure-of-eight suture was placed in  the anterior lip of the cervix where the tenaculum was held, and there  was a slight amount of bleeding.  The patient tolerated the procedure  well and was transferred from the recovery room in satisfactory  condition.      Phil D. Okey Dupre, M.D.  Electronically Signed     PDR/MEDQ  D:  09/11/2007  T:  09/11/2007  Job:  [id number]

## 2010-11-06 NOTE — Op Note (Signed)
Angela Walls, Angela Walls NO.:  [id number]   MEDICAL RECORD NO.:  [id number]         PATIENT TYPE:  WINP   LOCATION:                                FACILITY:  [facility]   PHYSICIAN:  Lazaro Arms, M.D.   DATE OF BIRTH:  09-Sep-1980   DATE OF PROCEDURE:  10/22/2008  DATE OF DISCHARGE:                               OPERATIVE REPORT   PREOPERATIVE DIAGNOSES:  1. Intrauterine pregnancy at [redacted] weeks gestation.  2. Chronic hypertension.  3. Persistent fetal bradycardic episode.   POSTOPERATIVE DIAGNOSES:  1. Intrauterine pregnancy at [redacted] weeks gestation.  2. Chronic hypertension.  3. Persistent fetal bradycardic episode.   PROCEDURE:  Primary low transverse cesarean section.   SURGEON:  Lazaro Arms, MD   ANESTHESIA:  Epidural.   FINDINGS:  There was no nuchal cord at all at the time of surgery.  She  had persistent fetal bradycardia down to the 60s to 80s.  It did come  back up into the 90s to lower 100s and went back down again.  She was 10,  I pushed cervix back, she was incomplete, we pushed a few times making  no progress, the heart rate staying down.  So, as a result I proceeded  with C-section, delivered a viable female at 19 with Apgars of 8 and 9  with a pH of 7.33.  Three-vessel cord, cord gas, cords were sent as well  as cord blood.  Uterus, tubes, and ovaries were normal.   DESCRIPTION OF OPERATION:  The patient was taken to the operating room,  placed in the supine position.  Epidural was dosed up.  She was prepped  and draped in the usual sterile fashion.  A Pfannenstiel skin incision  was made and carried down sharply to the rectus fascia, scored in the  midline and extended laterally.  The fascia was taken off the muscle  superiorly and inferiorly without difficulty.  The muscles were divided  and the peritoneal cavity was entered.  A bladder blade was placed.  A  low transverse hysterotomy incision was made.  Over this incision, was  delivered a viable  female infant at 86 with Apgars of 8 and 9, weight to  be determined by the Nursery, cord pH 7.33.  Infant underwent routine  neonatal resuscitation.  The uterus was exteriorized, placenta delivered  spontaneously, and the uterus was wiped clean with clean lap pads,  closed in 2 layers, the first being a running interlocking layer and the  second being an imbricating layer.  There was good hemostasis.  Uterus  was replaced in the peritoneal cavity.  The muscles and peritoneum were  reapproximated after a copious irrigation.  Fascia was  closed using 0 Vicryl in a running; subcutaneous tissue made hemostatic  and irrigated.  The skin was closed using skin staples.  The patient  tolerated the procedure well.  She experienced 500 mL of blood loss,  taken to recovery room in good stable condition.  All counts were  correct x3.  She received Ancef prophylactically.      Amaryllis Dyke.  Despina Hidden, M.D.  Electronically Signed     LHE/MEDQ  D:  10/22/2008  T:  10/22/2008  Job:  [id number]

## 2010-11-06 NOTE — Discharge Summary (Signed)
Angela Walls, Angela Walls NO.:  [id number]   MEDICAL RECORD NO.:  [id number]          PATIENT TYPE:  INP   LOCATION:  9304                          FACILITY:  [facility]   PHYSICIAN:  Odie Sera, DO      DATE OF BIRTH:  [birth year]   DATE OF ADMISSION:  10/28/2008  DATE OF DISCHARGE:  10/31/2008                               DISCHARGE SUMMARY   DISCHARGE DIAGNOSES:  1. Postoperative ileus.  2. Postoperative infection.  3. Trichomonas.  4. Chronic hypertension.  5. Systemic lupus erythematosus.   REASON FOR ADMISSION:  Angela Walls is a 30 year old now postop patient  after a low-transverse cesarean section 6 days prior to her admission  that presented with a sudden increase in abdominal pain diffusely and  sharp in nature.  She had some nausea and 1 episode of emesis prior to  her arrival.  On evaluation in the MAU, she was found to be mildly  distended, tympanitic with decreased bowel sounds, and an x-ray which  showed some air-fluid levels suggestive of a postoperative ileus.  The  patient was admitted for further management of this.   HOSPITAL COURSE:  The patient was admitted for management of her ileus.  She was initially placed on fluids and after 24 hours in the hospital,  she had a fever up to 103 degrees Fahrenheit.  She had a bit of elevated  white count on her CBC.  Her other labs were remarkable for a urinalysis  which revealed 21-50 white blood cells and Trichomonas.  Her urine  cultures came back as negative as did some blood cultures that were done  at the time of her fever.  She was started on antibiotics.  Initially,  she was placed on cefotetan and Flagyl and then she was changed to  gentamicin and clindamycin.  She then went on to develop some diarrhea  which last about 24 hours.  A C.  Diff was done which came back as  negative.  Ova and parasite studies were pending at the time of  discharge.  Additionally during her hospitalization, her blood  pressures  trended towards the higher side ranging between 140-50s/80s-90s.  At the  time of discharge, she has been afebrile for almost 36 hours.  Her pain  has significantly improved.  She is tolerating a full diet and without  difficulty and she is requesting to go home.   Her ileus seems to have resolved.  It is unclear at this time, the  etiology of her fever.  However, given the high level of white cells in  her urine and then the negative urinalysis as well as the Trichomonas in  her urine, I am suspicious that she may have had a gonorrhea or  Chlamydial infection.  She has been treated for this appropriately  during the hospitalization.  We will continue Flagyl and a Z-Pak as an  outpatient for further coverage.   MEDICATIONS AT DISCHARGE:  She will continue her labetalol and  hydralazine as well as her prednisone that she took as an outpatient.  Additionally, hydrochlorothiazide 25 mg  daily for better blood pressure  control.  She will also be continuing Flagyl for course of 7 days and  she will be given a Z-Pak as the patient is allergic to penicillins.   INSTRUCTION FOR THE PATIENT:  The patient was instructed on the above  medication use.  Additionally, she was instructed to slowly progress her  activity as she will return if her symptoms worsen and she will call the  [facility] in 1-2 weeks for a follow up appointment.   CONDITION AT DISCHARGE:  Improved.  The patient will be discharged home  in good condition.  She is ambulatory.  All her questions have been  answered and she voiced understanding of the above instructions.      Odie Sera, DO  Electronically Signed     MC/MEDQ  D:  10/31/2008  T:  10/31/2008  Job:  [id number]

## 2010-11-09 NOTE — Procedures (Signed)
[place]  Patient:    Angela Walls, Angela Walls Visit Number: [id number] MRN: [id number]          Service Type: EMS Location: ED Attending Physician:  Sandi Raveling Dictated by:   Charlcie Cradle Delford Field, M.D. [facility] Proc. Date: 05/26/01 Admit Date:  05/23/2001 Discharge Date: 05/23/2001   CC:         Huey Bienenstock McDiarmid, M.D.   Procedure Report  PROCEDURE:  Bronchoscopy.  CHIEF COMPLAINT:  Bilateral pulmonary infiltrates.  OPERATOR:  Charlcie Cradle. Delford Field, M.D.  ANESTHESIA:  1% Xylocaine local.  PREOPERATIVE MEDICATIONS:  Demerol 50 mg IV push, Versed 6 mg IV push.  DESCRIPTION OF PROCEDURE:  The Olympus video bronchoscope was introduced to the right naris.  The upper airways were visualized and were unremarkable. The entire tracheobronchial tree was visualized, revealed no endobronchial lesions.  Attention was then paid to the right middle lobe.  Approximately a 150 cc volume was infused, approximately 75 cc volume returned.  The right lower lobe was then approached and under fluoroscopic control, transbronchial biopsies x 5 were obtained from the right lower lobe lateral segment.  COMPLICATIONS:  None.  IMPRESSION:  Diffuse bilateral infiltrates, evaluate for sarcoidosis.  RECOMMENDATIONS:  Follow up pathology and microbiology. Dictated by:   Charlcie Cradle Delford Field, M.D. [facility] Attending Physician:  Sandi Raveling DD:  05/26/01 TD:  05/27/01 Job: [id number]

## 2010-11-09 NOTE — Discharge Summary (Signed)
Angela Walls. [facility]  Patient:    CATALYNA, REILLY Visit Number: [id number] MRN: [id number]          Service Type: MED Location: [id number] Attending Physician:  Doneta Public Dictated by:   Juanell Fairly, M.D. Admit Date:  10/16/2001 Discharge Date: 10/19/2001   CC:         Maryelizabeth Rowan, M.D.   Discharge Summary  DATE OF BIRTH:  10-08-1980  DISCHARGE DIAGNOSES: 1. Systemic lupus erythematosus exacerbation. 2. Bacterial vaginosis and Trichomonas. 3. Anemia. 4. Hypertension.  DISCHARGE MEDICATIONS: 1. Iron sulfate 325 mg p.o. b.i.d. 2. Prednisone 40 mg p.o. q.d. 3. Altace 2.5 mg p.o. q.d. 4. Plaquenil 400 mg p.o. q.d. 5. Os-Cal 500 plus D b.i.d. with food. 6. Fosamax 70 mg once a week.  FOLLOWUP: 1. Dr. Duanne Guess on Oct 28, 2001, at 11:50 a.m. 2. She was discharged with an appointment for a bone scan at the [facility] at 9:45 a.m. on October 20, 2001.  HISTORY OF PRESENT ILLNESS:  Ms. Fishman is a 30 year old female who presented originally to the ER on October 09, 2001, diagnosed with pneumonia and treated with Levaquin.  She was seen again on October 10, 2001, for a followup, and it was determined that her pulmonary issues were most likely a viral illness. She completed her course of Levaquin, but had persistent fever, malaise, and decreased p.o., as well as decreased urine output and myalgias.  The patient has a history of lupus, and had been on 20 mg q.d. of prednisone when she was discharged from the hospital on May 25, 2001.  She had her prednisone tapered on October 17, 2001.  Please see admission H&P for further history.  PHYSICAL EXAMINATION:  VITAL SIGNS:  Blood pressure and pulse while laying down were 160/80 and 90, respectively.  When she stood up her blood pressure and pulse went to 135/70 and 120, respectively, and the patient felt dizzy.  GENERAL:  General appearance was mild distress.  HEENT:  Dry  mucus membranes.  CARDIOVASCULAR:  No crackles, no wheezes, no supraclavicular retractions, and the patient had good air movement.  S1 and S2 positive, no murmurs heard.  ABDOMEN:  Soft, bowel sounds positive, no hepatosplenomegaly.  No masses were appreciated.  LABORATORY DATA:  On October 09, 2001, her white blood cell count was 5.9, hemoglobin was 7.8, platelet count was 411.  On October 13, 2001, her white blood cell count was 7, her hemoglobin was 7.7, and her platelet count was 448.  On October 16, 2001, her white blood cell count was 8.8, her hemoglobin was 7.7, and her platelet count was 473.  Chest x-ray on October 13, 2001, showed hilar prominence with peribronchial lymph nodes and interstitial changes per report.  She had a negative mono screen and a negative Strep also on October 13, 2001.  On October 16, 2001, her sodium was 136, potassium 4.3, chloride 104, bicarbonate 26, BUN 10, creatinine 1.1, glucose 82.  AST 21, ALT 9, total protein 6.5, albumin 2.4, calcium 7.7.  She had a cathed UA with specific gravity of 1.025, had 15 ketones, greater than 300 protein, small amount of blood, no leukocyte esterase, no nitrites.  It was sent for culture. Microscopic examination showed 5 to 10 white blood cells, 1 to 5 red blood cells, 1 to 5 epithelial cells, 2+ bacteria, clue cells, and small hyalin casts.  HOSPITAL COURSE:  #1 - SYSTEMIC LUPUS ERYTHEMATOSUS EXACERBATION/LUPUS PNEUMONITIS:  The patient was replaced on prednisone at 40 mg q.d., and her symptoms greatly improved over the course of the next three days.  The patient was ready for discharge by the morning of October 19, 2001.  It is unlikely the patient will be able to be weaned off prednisone completely.  Therefore, we initiated calcium and vitamin D therapy, and at discharge, set her up with an appointment to get a bone scan for baseline at the [facility].  They instructed the patient to begin taking Fosamax 70 mg  once a week in addition to her previous lupus medications.  #2 - BACTERIAL VAGINOSIS AND TRICHOMONAS:  A HIV test and RPR were done on the patient, both of which were negative.  The patient was given metronidazole 2 g p.o. x1 at the [facility] prior to admission.  #3 - ANEMIA:  This is most likely anemia of chronic disease, and secondary to her lupus, however, we will continue the patient on iron sulfate.  There is also a question as to whether or not the patient is an Epogen candidate.  #4 - HYPERTENSION:  This could simply be due to the patients lupus flare, but we will place the patient on Altace after hydration, and she will be discharged on 2.5 mg q.d.  DISCHARGE LABORATORY DATA:  The patients white blood cell count was 6.9, hemoglobin 7.4, hematocrit 21.8, platelet count 607.  Sodium was 139, potassium 3.6, chloride 110, bicarbonate 25, BUN 9, creatinine 0.7, glucose 89.  AST 23, ALT 11, alkaline phosphatase 43.  Total bilirubin 0.3.  Calcium was 7.9.  Her RPR was nonreactive.  Her HIV was nonreactive.  Her guaiac was negative.  Complement 3 was 23.  Complement 4 was 2.  C. diff was negative x2. Urine pregnancy was negative.  Group A Strep was negative, and mono spot was negative.  Blood cultures are negative to date. Dictated by:   Juanell Fairly, M.D. Attending Physician:  Doneta Public DD:  10/19/01 TD:  10/20/01 Job: [id number]

## 2010-11-09 NOTE — Discharge Summary (Signed)
[place]. [facility]  Patient:    Angela Walls, Angela Walls Visit Number: [id number] MRN: [id number]          Service Type: MED Location: 5000 [id number] Attending Physician:  McDiarmid, Leighton Roach. Dictated by:   Emelda Fear, M.D. Admit Date:  05/23/2001 Discharge Date: 05/29/2001   CC:         [facility]  [facility], [place]. Dr. Netta Neat. Chase Picket, M.D.   Discharge Summary  DATE OF BIRTH:  [birth year]  CONSULTATIONS:  Pulmonology, Dr. Delford Field.  PROCEDURES:  Bronchoscopy.  DISCHARGE DIAGNOSES: 1. Newly diagnosed systemic lupus erythematosus with pneumonitis. 2. Anemia secondary to iron deficiency and chronic disease. 3. Hypoxia secondary to lupus pneumonitis, now resolved.  DISCHARGE MEDICATIONS: 1. Prednisone 60 mg p.o. q.d. 2. Phenergan 12.5 mg p.o. q.6h. p.r.n. nausea. 3. Ferrous sulfate 325 mg p.o. t.i.d. 4. Tylenol 650 mg q.4h. p.r.n. fever. 5. Laxative of choice p.r.n. constipation.  HISTORY OF PRESENT ILLNESS:  Angela Walls is a 30 year old previously healthy African American female who presented secondary to a 3-4 week history of persistent fevers, cough, and chest pain.  Chest x-ray performed on presentation revealed patchy bilateral perihilar infiltrates with patchy reticular nodular air space opacities, more pronounced in the upper lobes. Small bilateral effusions were also noted.  Also of significance, the patient had an O2 saturation of 95% on room air at rest; however, desaturated to 81% on room air with ambulation.  On presentation, the patient was also found to be significantly anemic with a hemoglobin of 7.9.  The patient also had a low sodium at 128.  Atypical lymphocytes were also revealed.  The patient was admitted and initiated on Rocephin and Zithromax IV antibiotic therapy for potential community-acquired pneumonia.  Additional tests were ordered throughout her  hospitalization.  Pulmonology was consulted during admission. Bronchoscopy was performed and pathology ruled out any evidence of sarcoidosis or atypical pathology.  Respiratory cultures including AFB, Legionella, fungal, and normal respiratory cultures were all negative on day of discharge. ANA and anti-double-stranded DNA antibodies were positive.  After discussing the case with rheumatologist, Dr. Phylliss Bob, the patient was initiated on prednisone 60 mg every day.  The patient will follow up in the upcoming month with Dr. Phylliss Bob at his office.  HOSPITAL COURSE: #1 - NEWLY DIAGNOSED SYSTEMIC LUPUS ERYTHEMATOSUS WITH PNEUMONITIS:  On presentation, the patient was complaining of fevers with cough and chest pain. The patient had desaturations with ambulation down to 81% on room air.  Chest x-ray on presentation revealed patchy reticular nodular-type air space opacities, slightly more pronounced in the upper lobes, as well as small bilateral effusions and mildly prominent right hilum.  CT of chest revealed bulky bilateral axillary adenopathy and mild shotty adenopathy in the mediastinum with bilateral hilar regions.  There were also patchy alveolar infiltrates predominating in the upper lung zones, suspicious for sarcoidosis, lymphoma, or other inflammatory or infectious etiologies.  Bilateral pleural effusions, left side larger than right, were also appreciated on CT scan. Bronchoscopy was performed by pulmonology, which did not show evidence of sarcoidosis.  Cultures were performed on tissue obtained and were negative on date of discharge for fungal, Legionella, AFB, and other infectious etiologies.  Of note, the patient remained febrile until day of discharge. The patient did receive five-day antibiotic therapy of Rocephin and Zithromax without improvement in fevers or symptoms.  The patient did receive one dose of prednisone 60 mg while in-house.  The patient will follow up, as mentioned above,  with Dr. Phylliss Bob, rheumatologist.  The patient is discharged on prednisone 60 mg every day.  She will have close follow up at the family practice teaching service for assessment of respiratory status.  The patient also received a flu vaccine before discharge.  Of note, the patients post-ambulation O2 saturation on day of discharge was 100% on room air.  The patient was not discharged on home oxygen.  Ace level performed during admission was within normal limits.  ANA was positive with a titer greater than 1280.  Anti-double-stranded DNA antibody was also positive during her admission.  HIV was negative.  #2 - ANEMIA:  The patient presented with a hemoglobin of 7.9.  The patient had previously been on iron therapy but had quit taking home regimen prior to presentation.  The patient was initiated on iron therapy of t.i.d. dosing. Iron levels less than 10, reticulocytes were 0.4, ferritin was 170.  The patient will be discharged on iron therapy.  Anemia most likely thought to be secondary to chronic disease state.  We will discharge the patient on iron therapy to assist with therapy.  #3 - HYPOXIA SECONDARY TO SYSTEMIC LUPUS ERYTHEMATOSUS WITH PNEUMONITIS, NOW RESOLVED:  As noted on day of discharge, the patient had a preambulation O2 saturation of 100%.  Post-ambulation O2 saturation was 100% as well.  We will follow the patient up closely in the upcoming week to assess respiratory status.  #4 - HYPONATREMIA:  The patient did present with a sodium of 125.  Sodium was corrected with D5 normal saline during her admission.  Discharge sodium was 136.  DISCHARGE LABORATORY DATA:  Rapid Strep test was negative.  Serum iron was less than 10, reticulocyte was 0.4%, absolute reticulocytes were down at 11.5.  EKG was normal sinus rhythm.  Albumin was 2.2, alkaline phosphatase was 33, phosphorus was 3.0, magnesium  was 2.3.  Monospot was negative.  Cardiac enzymes were within normal  limits. Prealbumin was 6.0.  TSH was 1.315.  HIV was nonreactive.  Pulmonary function tests performed during admission revealed a decreased FVC and a decreased FEV with a normal FEV1 to FVC ratio, suggestive of restrictive lung disease. Total bilirubin was 0.5, alkaline phosphatase 38, SGOT 23, SGPT 9, total protein 6.4.  ANA titer greater than 1280.  ANA antibody was positive.  Ace level was 34.  Bronchial washings revealed a pink, hazy fluid with WBCs of 300, neutrophils of 2, lymphocytes of 65, macrophages of 32, and eosinophils of 1.  Anti-double-stranded DNA antibody was positive with a ______ and AB titer greater than 640.  Sodium of 136, potassium 4.0, chloride 106, CO2 25, BUN 5, creatinine 0.7, glucose 94, calcium 7.8, white blood cells 5.4, hemoglobin 7.9, hematocrit 23.1, platelets 586.  Acid fast culture negative to date.  Yeast culture negative to date.  Legionella culture negative to date. Respiratory culture negative to date.  CT of chest revealed bulky bilateral axillary adenopathy and mild shotty adenopathy in the mediastinum and bilateral hilar regions.  There were also patchy alveolar infiltrates predominating in the upper lung zones.  Tiny bilateral pleural effusions present, left side larger than right.  Chest x-ray revealed patchy reticular nodular-type air space opacity, slightly more pronounced in the upper lobes.  Small bilateral effusions.  Mild prominent right hilum.  Urinalysis performed revealed yellow, clear with a specific gravity of 1.006, pH of 6.5, glucose negative, hemoglobin negative, bilirubin negative, ketones negative, protein negative, urobilinogen 0.2, and nitrite  negative.  Urine pregnancy was negative.  Absolute lymphocytes performed were 1.4.  Lymphocyte percentage was 31%, absolute neutrophils 59, absolute neutrophils 2.8.  FOLLOWUP APPOINTMENTS:  The patient has a followup appointment at [facility] with Dr. Duanne Guess on Thursday,  December 12 at 11:30.  The patient also has an appointment with Dr. Phylliss Bob at [facility] on Monday, December 30, at 8:45.  ACTIVITY:  No restrictions.  DIET:  No restrictions.  WOUND CARE:  Not applicable.  SPECIAL INSTRUCTIONS:  The patient is instructed to call [name] at [phone] with severe shortness of breath. Dictated by:   Emelda Fear, M.D. Attending Physician:  Acquanetta Belling D. DD:  05/29/01 TD:  05/30/01 Job: [id number]

## 2010-11-09 NOTE — H&P (Signed)
[place]. [facility]  Patient:    Angela Walls, Angela Walls Visit Number: [id number] MRN: [id number]          Service Type: Attending:  Mena Pauls, M.D. Dictated by:   Geroge Baseman Spry Adm. Date:  05/23/01                           History and Physical  CHIEF COMPLAINT:  Fever and left-sided chest pain.  HISTORY OF PRESENT ILLNESS:  This is a 30 year old previously healthy female who presented to [facility] tonight secondary to persistent fevers and chest. The patient states that over the past three weeks, she has had off and on fevers as high as to 103 orally. The fevers have been accompanied with fatigue and sore throat. She was seen and evaluated at [facility] on the 21st for her illness and was diagnosed with strep pharyngitis were she received treatment with Penicillin orally x 7 days. The patient states that her symptoms never really went away despite the antibiotic usage and now she complains of worsening cough productive of clear sputum, mild shortness of breath and left-sided chest pain. This is accompanied with some occasional nausea and vomiting x 1. She notes no radiation of the pain. 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 She has been seen twice in the [facility] over the past three months for some increasing pedal edema. She was told at one point she had hypertension and was placed on HCTZ. However, last month returned, and was taken off of the medication secondary to low blood pressures.  PAST MEDICAL HISTORY:  Significant for anemia, however, stopped her iron therapy one month ago. She also has a questionable history of hypertension and was preeclampsic during her pregnancy. She is a gravida 1, para 1 with a vaginal delivery three years ago.  CURRENT MEDICATIONS:  None.  ALLERGIES:  NO KNOWN DRUG ALLERGIES.  PAST SURGICAL HISTORY:  She had an appendectomy in 1999.  SOCIAL  HISTORY:  She works as a Lawyer. She is single with a 63-year-old son. Denies any tobacco, alcohol or drug use.  FAMILY HISTORY:  Her maternal grandmother has diabetes, hypertension, CHF, coronary artery disease, chronic renal failure on dialysis. Her mother suffers from anemia.  REVIEW OF SYSTEMS:  Significant for a 5 to 10 pound weight loss this month which, according to the patient, has been secondary to her diuretic usage. She has had a poor appetite for three to four days. A regular menses secondary to her use of Depo-Provera; however, her last menstrual cycle was one week ago. She notes sore throat and mild left upper quadrant pain, but no constipation or diarrhea. The remained of her review of systems is per the HPI.  PHYSICAL EXAMINATION:  VITAL SIGNS:  Temperature 99.8, pulse 83 and regular, respirations 22, blood pressure 130/64. She is saturating 95% on room air.  GENERAL:  She is fatigued but no acute distress. Alert and oriented x 4.  HEENT:  Pale conjunctivae noted but no erythema. Pale moist mucous membranes with mild pharyngeal erythema.  NECK:  Supple with bilateral cervical lymphadenopathy, greater on the left, with left-sided supraclavicular lymphadenopathy as well.  CARDIOVASCULAR:  She has a normal S1 and S2. Regular rate and rhythm with a grade 2/6 systolic murmur heard best at the left upper sternal border.  LUNGS:  There is slight decreased breath sounds in the left  base but otherwise clear. No wheezes, rales or rhonchi. Good air movement. She does have some left-sided chest wall tenderness to palpation.  ABDOMEN:  Soft. Nontender. Nondistended. Positive bowel sounds. No hepatosplenomegaly. No masses.  EXTREMITIES:  No cyanosis, clubbing or edema. Pulses are 2+. There is no axillary or groin lymphadenopathy noted.  LABORATORIES ON ADMISSION:  Her white count is 4.9, H&H is 7.9 and 22.5, platelets 458,000, MCV of 80. Her differential shows atypical  lymphocytes and greater than 20 bands. BMP on admission includes a sodium of 128, potassium of 3.4, chloride 107, bicarbonate 25, BUN 10, creatinine 0.9, glucose 88. Her calcium is 7.7. Rapid strep is negative. Chest x-ray, per report, shows patchy bilateral perihilar infiltrates.  ASSESSMENT AND PLAN:  This is a 30 year old female with persist fevers and perihilar infiltrates of uncertain etiology on her chest film.  1. Infectious disease. Fever source currently is unknown, possibly secondary    to an atypical pneumonia or Epstein-Barr virus. Would also, given her    relatively low white count and weight loss, would consider an acute    human immunodeficiency virus. While awaiting cultures, we will treat    presumptively for a community acquired pneumonia with Rocephin and    Zithromax. We will check a monospot secondary to her fatigue, left    upper quadrant pain and sore throat. We will check the human    immunodeficiency virus due to her lymphadenopathy and somewhat low    white count. 2. Pulmonary, questionable atypical pneumonia. Of course given the    patients age and ethnicity, would also include sarcoidosis in    the differential. We will review films with radiology and follow    her pulmonary process. 3. Left-sided chest pain likely secondary to the patients pulmonary    process. However, for completeness, we will check a set of cardiac    enzymes and an EKG. If the patient continues to have shortness of breath    or any documented hypoxia, would consider getting an ABG; however,    currently is doubtful. 4. Anemia with atypical lymphs on smear. We will check an iron, a TIBC,    as well as a retic count. Currently, no need for transfusion as patient    is not symptomatic and has been otherwise a healthy female; however,    we will follow her hemoglobin closely. 5. Hyponatremia likely secondary to her diuretic usage. However, we will    check a serum and urine osmolality to  looks for SIADH due to her pulmonary    process.  6. Hypocalcemia, uncertain as to etiology as HCTZ usually dyspnea on exertion    not deplete calcium stores. We will check a magnesium and a phosphorus.    Would also consider checking PTH levels if the patient remains    hypocalcemic.  Dictated by:   Raynelle Jan Attending:  Mena Pauls, M.D. DD:  05/23/01 TD:  05/23/01 Job: [phone]

## 2010-11-09 NOTE — Consult Note (Signed)
[place]. [facility]  Patient:    Angela Walls, Angela Walls Visit Number: [id number] MRN: [id number]          Service Type: MED Location: 5000 5022 01 Attending Physician:  McDiarmid, Leighton Roach. Dictated by:   Charlcie Cradle Delford Field, M.D. [facility] Admit Date:  05/23/2001   CC:         Huey Bienenstock McDiarmid, M.D.  Patients chart   Consultation Report  CHIEF COMPLAINT:  Bilateral apical infiltrates.  HISTORY OF PRESENT ILLNESS:  A 30 year old African-American female who has been ill for a week with fever, sore throat, productive cough of yellow mucus. She was initially diagnosed with strep pharyngitis and given a weeks worth of penicillin without improvement.  She came to the hospital on May 23, 2001 and noted increasing chest pain, increasing cough, and increasing fever.  Upon further questioning, she has had intermittent fever and sore throat for about three weeks total and her symptoms have been more subacute in nature. Because of persisting infiltrates and left-sided chest pain, the patient was admitted May 23, 2001 to the family practice teaching service.  Did have some nausea.  No emesis and no diarrhea.  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  Has had pedal edema in the past.  Been told she had hypertension and placed on hydrochlorothiazide.  This was taken off in the last month because of low blood pressure.  The patient denies any significant exposure history that she is aware of, except for as a roll working at [employer].  PAST MEDICAL HISTORY:  No history of asthma, gastroesophageal reflux disease, sinus disease, pneumonia.  Did have hypertension in the past, now somewhat resolved.  No history of MI.  SOCIAL HISTORY:  The patient does not smoke or drink.  No other exposure history.  No TB exposure history that she is aware.  No illicit drug use.  ALLERGIES:  None.  CURRENT MEDICATIONS:  (After hospitalization) 1. Zithromax 500 mg IV  q.d. 2. Rocephin 1 gm IV q.d.  FAMILY HISTORY:  Noncontributory.  REVIEW OF SYSTEMS:  Noncontributory.  PAST SURGICAL HISTORY:  Noncontributory.  PHYSICAL EXAMINATION:  VITAL SIGNS:  Temperature currently 99.  Room air saturation of 95%.  Blood pressure 135/65.  GENERAL:  This is a well-developed, well-nourished African-American female in no acute distress.  CHEST:  Apical rales, otherwise clear.  CARDIOVASCULAR:  Irregular rate and rhythm with S3.  Normal S1 and S2.  ABDOMEN:  Soft and nontender.  Bowel sounds active.  No hepatosplenomegaly, nontender, no masses.  EXTREMITIES:  No edema, clubbing, or venous disease.  NEUROLOGICAL:  Intact.  NECK:  No lymphadenopathy.  Supple.  HEENT:  Oropharynx clear.  LABORATORY DATA:  Sodium 128, potassium 3.6, chloride 107, CO2 25, BUN 10, creatinine 0.9, blood sugar 88, calcium 7.7.  Group A strep screen is negative.  Hemoglobin of 7.9, white count 4.9.  Chest x-ray shows biapical reticular nodule infiltrates.  IMPRESSION: 1. 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 2. Anemia of chronic disease. 3. Bilateral atypical reticular nodule infiltrates, rule out sarcoidosis plus    or minus atypical community-acquired pneumonia.  Doubt tuberculosis.  RECOMMENDATIONS:  Obtain CT of the chest with contrast.  Obtain ACE level. Obtain full set of pulmonary function studies.  Continue IV Zithromax and IV Rocephin.  May yet require fiberoptic bronchoscopy for diagnostic studies. Dictated by:   Charlcie Cradle Delford Field, M.D. [facility] Attending Physician:  McDiarmid, Tawanna Cooler D. DD:  05/24/01 TD:  05/24/01  Job: [id number]

## 2010-11-20 ENCOUNTER — Other Ambulatory Visit: Payer: Self-pay | Admitting: Family Medicine

## 2010-11-20 DIAGNOSIS — M87059 Idiopathic aseptic necrosis of unspecified femur: Secondary | ICD-10-CM

## 2010-11-20 MED ORDER — HYDROCODONE-ACETAMINOPHEN 10-660 MG PO TABS: 1.0000 | ORAL_TABLET | Freq: Four times a day (QID) | ORAL | Status: DC | PRN

## 2010-11-20 NOTE — Telephone Encounter (Signed)
Left message for patient to return call, please inform her that Rx is ready for pickup.Tarik Teixeira, [name]

## 2010-11-20 NOTE — Telephone Encounter (Signed)
Done. Placed with RED TEAM to call.

## 2010-11-20 NOTE — Telephone Encounter (Signed)
Calling for monthly refill on pain med

## 2010-11-28 ENCOUNTER — Encounter: Payer: Self-pay | Admitting: Family Medicine

## 2010-12-18 ENCOUNTER — Telehealth: Payer: Self-pay | Admitting: Family Medicine

## 2010-12-18 ENCOUNTER — Other Ambulatory Visit: Payer: Self-pay | Admitting: Family Medicine

## 2010-12-18 DIAGNOSIS — M87059 Idiopathic aseptic necrosis of unspecified femur: Secondary | ICD-10-CM

## 2010-12-18 MED ORDER — HYDROCODONE-ACETAMINOPHEN 10-660 MG PO TABS: 1.0000 | ORAL_TABLET | Freq: Four times a day (QID) | ORAL | Status: DC | PRN

## 2010-12-18 NOTE — Telephone Encounter (Signed)
Returned patient's call, request for hydrocodone. According to Dr Philis Pique note from last office visit patient needs to follow up if she has not improved. When I told patient we would not refill her hydrocodone she needed to schedule an office visit she began to scream at me telling me that Earlene Plater fills her prescription every month and she demanded a refill or to speak with Earlene Plater. She did not understand why she would have to come in. I told her she was not here today but I would send a message to her to see if she would send refill without office visit. Also informed patient that Earlene Plater was leaving this week and she would need to schedule appointment with new PCP to continue on pain meds.Angela Walls, [name]

## 2010-12-18 NOTE — Telephone Encounter (Signed)
Need refill for hydrocodone.  Call when ready.

## 2010-12-18 NOTE — Telephone Encounter (Signed)
Called patient and LMOV. Okay to refill Rx. Patient has chronic avascular necrosis, requiring pain medication refill monthly. CMA help appreciated - he was looking at patient's note on acute pain - DeQuervains. Medication will be in 'to be called' box.

## 2010-12-28 ENCOUNTER — Telehealth: Payer: Self-pay | Admitting: *Deleted

## 2010-12-28 ENCOUNTER — Telehealth: Payer: Self-pay | Admitting: Family Medicine

## 2010-12-28 NOTE — Telephone Encounter (Signed)
Called pt to call back. Please tell pt: The only immunization we have here are: Flu shot 04-28-2007 TD booster 06-25-1999 Arlyss Repress

## 2010-12-28 NOTE — Telephone Encounter (Signed)
Pt is requesting copy of shot record for her new job, says she has been a pt here for a long time.

## 2011-01-01 NOTE — Telephone Encounter (Signed)
See previous note .Angela Walls

## 2011-01-04 ENCOUNTER — Ambulatory Visit (INDEPENDENT_AMBULATORY_CARE_PROVIDER_SITE_OTHER): Payer: Medicaid Other | Admitting: Family Medicine

## 2011-01-04 ENCOUNTER — Encounter: Payer: Self-pay | Admitting: Family Medicine

## 2011-01-04 VITALS — BP 149/93 | HR 76 | Temp 98.3°F | Wt 167.0 lb

## 2011-01-04 DIAGNOSIS — I10 Essential (primary) hypertension: Secondary | ICD-10-CM

## 2011-01-04 MED ORDER — HYDROCHLOROTHIAZIDE 25 MG PO TABS: 25.0000 mg | ORAL_TABLET | Freq: Every day | ORAL | Status: DC

## 2011-01-04 NOTE — Assessment & Plan Note (Signed)
BP mildly elevated today, although not taken any of her meds today.  Will refill her HCTZ.  Try to obtain labs from her Rheumatologist to make sure liver/kidney function looking ok.  Given Red flags for when to go to ED.

## 2011-01-04 NOTE — Patient Instructions (Signed)
It was very nice meeting you today.  I have sent over your prescription for HCTZ to your pharmacy for you to pick up.  If you have questions please call our office.

## 2011-01-04 NOTE — Progress Notes (Signed)
  Subjective:    Patient ID: Angela Walls, female    DOB: 29-Jun-1980, 29 y.o.   MRN: [id number]  HPI Patient here to meet me for the first time as she was seeing Dr. Earlene Plater in the past.  Needs refills on HCTZ, has been out x1 week.  Has had mild headaches x1 week as well, thinks it is related to her blood pressure being up.  She denies fever, chills, nausea, chest pain, palpitations, shortness of breath, weakness, numbness, edema.  She is currently followed by Dr. Azzie Roup for her lupus and says she had labs at his office recently and would have those sent over.    Review of Systems See HPI    Objective:   Physical Exam  Constitutional: She appears well-developed and well-nourished. No distress.  HENT:  Head: Normocephalic and atraumatic.  Eyes: EOM are normal. Pupils are equal, round, and reactive to light.  Cardiovascular: Normal rate, regular rhythm and normal heart sounds.   Pulmonary/Chest: Effort normal and breath sounds normal. No respiratory distress.  Musculoskeletal: She exhibits no edema.          Assessment & Plan:

## 2011-01-09 ENCOUNTER — Ambulatory Visit (INDEPENDENT_AMBULATORY_CARE_PROVIDER_SITE_OTHER): Payer: Medicaid Other | Admitting: Family Medicine

## 2011-01-09 ENCOUNTER — Encounter: Payer: Self-pay | Admitting: Family Medicine

## 2011-01-09 DIAGNOSIS — M654 Radial styloid tenosynovitis [de Quervain]: Secondary | ICD-10-CM

## 2011-01-09 DIAGNOSIS — Z0289 Encounter for other administrative examinations: Secondary | ICD-10-CM

## 2011-01-09 DIAGNOSIS — Z021 Encounter for pre-employment examination: Secondary | ICD-10-CM | POA: Insufficient documentation

## 2011-01-09 DIAGNOSIS — Z309 Encounter for contraceptive management, unspecified: Secondary | ICD-10-CM

## 2011-01-09 DIAGNOSIS — IMO0001 Reserved for inherently not codable concepts without codable children: Secondary | ICD-10-CM | POA: Insufficient documentation

## 2011-01-09 NOTE — Assessment & Plan Note (Signed)
Still mildly tender, resolving.  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

## 2011-01-09 NOTE — Progress Notes (Signed)
  Subjective:    Patient ID: Angela Walls, female    DOB: [birth year], 30 y.o.   MRN: [id number]  HPI Here for Pre-employment physical.  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  No problems thus far. I have reviewed patient's  PMH, FH, and Social history and Medications as related to this visit.  Dequervains:  left hand, resolving, has not had limitations with lifting children.  States she no longer has to take Voltaren gel or wear brace.  Arthalgias:  Takes hydrocodone at night for pain associated with lupus.   Review of Systems  Constitutional: Negative for fever, chills and unexpected weight change.  HENT: Negative for hearing loss, congestion, sore throat and tinnitus.   Eyes: Negative for discharge and visual disturbance.  Respiratory: Negative for cough, chest tightness and shortness of breath.   Cardiovascular: Negative for chest pain and palpitations.  Gastrointestinal: Negative for nausea, vomiting, abdominal pain and diarrhea.  Genitourinary: Negative for frequency and difficulty urinating.  Musculoskeletal: Positive for arthralgias. Negative for back pain and joint swelling.  Skin: Negative for rash and wound.  Neurological: Negative for dizziness, syncope, weakness, light-headedness, numbness and headaches.  Psychiatric/Behavioral: Negative for sleep disturbance and dysphoric mood. The patient is not nervous/anxious.        Objective:   Physical Exam    GEN: Alert & Oriented, No acute distress HEENT: [place]/AT. EOMI, PERRLA, no conjunctival injection or scleral icterus.  Bilateral tympanic membranes intact without erythema or effusion.  .  Nares without edema or rhinorrhea.  Oropharynx is without erythema or exudates.  No anterior or posterior cervical lymphadenopathy. CV:  Regular Rate & Rhythm, no murmur Respiratory:  Normal work of breathing, CTAB Abd:  + BS, soft, no tenderness to palpation Ext: no pre-tibial edema  + left  finklesteins Neuro: CN 2-12 intact, reflexes 2+ bilaterally     Assessment & Plan:

## 2011-01-09 NOTE — Assessment & Plan Note (Signed)
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  Advised may need to resume wearing wrist brace if deQuervains flares.  Advised caution with hydrocodone and somnolence,- she currently uses it only at night.

## 2011-01-09 NOTE — Assessment & Plan Note (Signed)
Patient is overdue on depo.  She decided to take a break to have a period.  Advised it is safe to be be amenorrheic on depo.  Cautioned on risk of pregnancy while on plaquenil.  She will return for depo or for further counseling.

## 2011-02-14 ENCOUNTER — Other Ambulatory Visit: Payer: Self-pay | Admitting: Family Medicine

## 2011-02-14 NOTE — Telephone Encounter (Signed)
Will fwd. To Dr.Matthews. ? .Angela Walls  

## 2011-02-14 NOTE — Telephone Encounter (Signed)
Angela Walls is calling for a refill on her Vicodin.  She would like a call when it is ready for pick up.

## 2011-02-15 NOTE — Telephone Encounter (Signed)
Angela Walls is calling again to find out what is going on with her medication refill.

## 2011-02-18 ENCOUNTER — Other Ambulatory Visit: Payer: Self-pay | Admitting: Family Medicine

## 2011-02-18 DIAGNOSIS — M87059 Idiopathic aseptic necrosis of unspecified femur: Secondary | ICD-10-CM

## 2011-02-18 MED ORDER — HYDROCODONE-ACETAMINOPHEN 10-660 MG PO TABS: 1.0000 | ORAL_TABLET | Freq: Four times a day (QID) | ORAL | Status: DC | PRN

## 2011-02-18 NOTE — Telephone Encounter (Signed)
Rx called in to [facility] on [address].

## 2011-02-18 NOTE — Telephone Encounter (Signed)
Patient called back, I informed her that her medication was called in to [facility] on [address].

## 2011-03-15 LAB — URINALYSIS, ROUTINE W REFLEX MICROSCOPIC
Bilirubin Urine: NEGATIVE
Glucose, UA: NEGATIVE
Hgb urine dipstick: NEGATIVE
Ketones, ur: 15 — AB
Nitrite: NEGATIVE
Protein, ur: NEGATIVE
Specific Gravity, Urine: 1.025
Urobilinogen, UA: 0.2
pH: 6.5

## 2011-03-15 LAB — HCG, QUANTITATIVE, PREGNANCY
hCG, Beta Chain, Quant, S: 220 — ABNORMAL HIGH
hCG, Beta Chain, Quant, S: 682 — ABNORMAL HIGH

## 2011-03-15 LAB — WET PREP, GENITAL
Clue Cells Wet Prep HPF POC: NONE SEEN
Trich, Wet Prep: NONE SEEN
Trich, Wet Prep: NONE SEEN
Yeast Wet Prep HPF POC: NONE SEEN
Yeast Wet Prep HPF POC: NONE SEEN

## 2011-03-15 LAB — CBC
HCT: 29.3 — ABNORMAL LOW
Hemoglobin: 10.3 — ABNORMAL LOW
MCHC: 35.3
MCV: 90.1
Platelets: 234
RBC: 3.25 — ABNORMAL LOW
RDW: 14
WBC: 10.3

## 2011-03-15 LAB — HCG, SERUM, QUALITATIVE: Preg, Serum: NEGATIVE

## 2011-03-15 LAB — POCT PREGNANCY, URINE
Operator id: 280671
Preg Test, Ur: POSITIVE

## 2011-03-15 LAB — GC/CHLAMYDIA PROBE AMP, GENITAL
Chlamydia, DNA Probe: NEGATIVE
Chlamydia, DNA Probe: NEGATIVE
GC Probe Amp, Genital: NEGATIVE
GC Probe Amp, Genital: NEGATIVE

## 2011-03-15 LAB — PROGESTERONE: Progesterone: 10.6

## 2011-03-15 LAB — ABO/RH: ABO/RH(D): O POS

## 2011-03-18 LAB — BASIC METABOLIC PANEL
BUN: 12
BUN: 13
CO2: 22
CO2: 24
Calcium: 8.1 — ABNORMAL LOW
Calcium: 8.9
Chloride: 106
Chloride: 107
Creatinine, Ser: 0.8
Creatinine, Ser: 0.82
GFR calc Af Amer: >60
GFR calc Af Amer: >60
GFR calc non Af Amer: >60
GFR calc non Af Amer: >60
Glucose, Bld: 108 — ABNORMAL HIGH
Glucose, Bld: 93
Potassium: 3.5
Potassium: 4.1
Sodium: 135
Sodium: 137

## 2011-03-18 LAB — CBC
HCT: 28.8 — ABNORMAL LOW
HCT: 30.9 — ABNORMAL LOW
Hemoglobin: 10 — ABNORMAL LOW
Hemoglobin: 10.7 — ABNORMAL LOW
MCHC: 34.7
MCHC: 34.8
MCV: 89.8
MCV: 89.9
Platelets: 219
Platelets: 226
RBC: 3.2 — ABNORMAL LOW
RBC: 3.44 — ABNORMAL LOW
RDW: 13.1
RDW: 13.4
WBC: 6.2
WBC: 8.7

## 2011-03-18 LAB — ABO/RH
ABO/RH(D): O POS
ABO/RH(D): O POS

## 2011-03-21 ENCOUNTER — Other Ambulatory Visit: Payer: Self-pay | Admitting: Family Medicine

## 2011-03-21 DIAGNOSIS — M87059 Idiopathic aseptic necrosis of unspecified femur: Secondary | ICD-10-CM

## 2011-03-21 NOTE — Telephone Encounter (Signed)
Fwd. To PCP 

## 2011-03-21 NOTE — Telephone Encounter (Signed)
Fwd to PCP .Angela Walls  

## 2011-03-21 NOTE — Telephone Encounter (Signed)
Needs refill on Vicodin - pls call when ready

## 2011-03-22 ENCOUNTER — Telehealth: Payer: Self-pay | Admitting: *Deleted

## 2011-03-22 MED ORDER — HYDROCODONE-ACETAMINOPHEN 10-660 MG PO TABS: 1.0000 | ORAL_TABLET | Freq: Four times a day (QID) | ORAL | Status: DC | PRN

## 2011-03-22 NOTE — Telephone Encounter (Signed)
Called pt and left message to call back. Please tell pt, that Rx for Vicodin is up front for pick up. Lorenda Hatchet, Renato Battles

## 2011-03-22 NOTE — Telephone Encounter (Signed)
Rx printed and given to Arlyss Repress to call patient to come pick up  Everrett Coombe, DO

## 2011-03-25 LAB — POCT URINALYSIS DIP (DEVICE)
Bilirubin Urine: NEGATIVE
Bilirubin Urine: NEGATIVE
Glucose, UA: NEGATIVE
Glucose, UA: NEGATIVE
Glucose, UA: NEGATIVE
Hgb urine dipstick: NEGATIVE
Hgb urine dipstick: NEGATIVE
Hgb urine dipstick: NEGATIVE
Ketones, ur: 15 — AB
Ketones, ur: NEGATIVE
Ketones, ur: NEGATIVE
Nitrite: NEGATIVE
Nitrite: NEGATIVE
Nitrite: NEGATIVE
Operator id: 194561
Operator id: 194561
Operator id: 297281
Protein, ur: 30 — AB
Protein, ur: NEGATIVE
Protein, ur: NEGATIVE
Specific Gravity, Urine: 1.02
Specific Gravity, Urine: 1.025
Specific Gravity, Urine: >=1.03
Urobilinogen, UA: 0.2
Urobilinogen, UA: 0.2
Urobilinogen, UA: 0.2
pH: 5.5
pH: 5.5
pH: 6

## 2011-03-25 LAB — URINALYSIS, ROUTINE W REFLEX MICROSCOPIC
Bilirubin Urine: NEGATIVE
Glucose, UA: NEGATIVE
Hgb urine dipstick: NEGATIVE
Ketones, ur: NEGATIVE
Nitrite: NEGATIVE
Protein, ur: NEGATIVE
Specific Gravity, Urine: 1.015
Urobilinogen, UA: 0.2
pH: 5.5

## 2011-03-25 LAB — URINE CULTURE: Colony Count: 75000

## 2011-03-25 LAB — URINE MICROSCOPIC-ADD ON: RBC / HPF: NONE SEEN

## 2011-03-26 LAB — POCT URINALYSIS DIP (DEVICE)
Bilirubin Urine: NEGATIVE
Bilirubin Urine: NEGATIVE
Glucose, UA: NEGATIVE
Glucose, UA: NEGATIVE
Hgb urine dipstick: NEGATIVE
Hgb urine dipstick: NEGATIVE
Ketones, ur: NEGATIVE
Nitrite: NEGATIVE
Nitrite: NEGATIVE
Operator id: 297281
Protein, ur: 100 — AB
Protein, ur: 30 — AB
Specific Gravity, Urine: 1.02
Specific Gravity, Urine: >=1.03
Urobilinogen, UA: 0.2
Urobilinogen, UA: 0.2
pH: 6
pH: 6

## 2011-03-26 LAB — URINALYSIS, ROUTINE W REFLEX MICROSCOPIC
Bilirubin Urine: NEGATIVE
Glucose, UA: NEGATIVE
Hgb urine dipstick: NEGATIVE
Ketones, ur: NEGATIVE
Nitrite: NEGATIVE
Protein, ur: NEGATIVE
Specific Gravity, Urine: 1.01
Urobilinogen, UA: 0.2
pH: 5.5

## 2011-03-26 LAB — WET PREP, GENITAL
Clue Cells Wet Prep HPF POC: NONE SEEN
Trich, Wet Prep: NONE SEEN
Yeast Wet Prep HPF POC: NONE SEEN

## 2011-03-26 LAB — URINE CULTURE: Colony Count: >=100000

## 2011-03-26 LAB — GC/CHLAMYDIA PROBE AMP, GENITAL
Chlamydia, DNA Probe: NEGATIVE
GC Probe Amp, Genital: NEGATIVE

## 2011-03-26 LAB — URINE MICROSCOPIC-ADD ON

## 2011-03-26 LAB — GLUCOSE, CAPILLARY: Glucose-Capillary: 86

## 2011-03-27 ENCOUNTER — Telehealth: Payer: Self-pay | Admitting: Family Medicine

## 2011-03-27 LAB — URINALYSIS, ROUTINE W REFLEX MICROSCOPIC
Bilirubin Urine: NEGATIVE
Glucose, UA: NEGATIVE
Hgb urine dipstick: NEGATIVE
Ketones, ur: NEGATIVE
Nitrite: NEGATIVE
Protein, ur: NEGATIVE
Specific Gravity, Urine: 1.02
Urobilinogen, UA: 1
pH: 6

## 2011-03-27 LAB — POCT PREGNANCY, URINE: Preg Test, Ur: POSITIVE

## 2011-03-27 NOTE — Telephone Encounter (Signed)
Left message for patient to return call. Patient needs to schedule an office visit.Angela Walls, [name]

## 2011-03-27 NOTE — Telephone Encounter (Signed)
Has been having problems with her Right Hip that she has seen Dr. Earlene Plater for previously.  It is causing her to miss work and she was told she would need something form her MD.  I attempted to encourage her to make appointment, but she wanted to talk to Dr. Ashley Royalty first.

## 2011-03-29 LAB — POCT URINALYSIS DIP (DEVICE)
Bilirubin Urine: NEGATIVE
Bilirubin Urine: NEGATIVE
Glucose, UA: NEGATIVE mg/dL
Glucose, UA: NEGATIVE mg/dL
Hgb urine dipstick: NEGATIVE
Ketones, ur: NEGATIVE mg/dL
Nitrite: NEGATIVE
Nitrite: NEGATIVE
Protein, ur: 100 mg/dL — AB
Protein, ur: 30 mg/dL — AB
Specific Gravity, Urine: 1.02 (ref 1.005–1.030)
Specific Gravity, Urine: 1.025 (ref 1.005–1.030)
Urobilinogen, UA: 0.2 mg/dL (ref 0.0–1.0)
Urobilinogen, UA: 0.2 mg/dL (ref 0.0–1.0)
pH: 5.5 (ref 5.0–8.0)
pH: 6.5 (ref 5.0–8.0)

## 2011-03-29 LAB — URINE MICROSCOPIC-ADD ON

## 2011-03-29 LAB — URINALYSIS, ROUTINE W REFLEX MICROSCOPIC
Bilirubin Urine: NEGATIVE
Glucose, UA: NEGATIVE mg/dL
Hgb urine dipstick: NEGATIVE
Ketones, ur: NEGATIVE mg/dL
Nitrite: NEGATIVE
Protein, ur: NEGATIVE mg/dL
Specific Gravity, Urine: >1.03 — ABNORMAL HIGH (ref 1.005–1.030)
Urobilinogen, UA: 0.2 mg/dL (ref 0.0–1.0)
pH: 6.5 (ref 5.0–8.0)

## 2011-04-04 NOTE — Telephone Encounter (Signed)
Ms. Gootee really wants to talk to Dr. Ashley Royalty

## 2011-04-04 NOTE — Telephone Encounter (Signed)
Please address .Arlyss Repress

## 2011-04-04 NOTE — Telephone Encounter (Signed)
Ms. Pritts doesn't understand why she needs to be seen.  I attempted to explain that if Dr. Ashley Royalty has not seen her for this particular problem, he will need to before he does anything.  She wasn't happy to hear that and really wants to speak to Dr. Ashley Royalty.  She asks that he call her at work.

## 2011-04-04 NOTE — Telephone Encounter (Signed)
Spoke with Ms. Angela Walls.  She just needs a letter explaining condition she has with her hip.  Told her I know this is a chronic problem of AVN of the hip and with  reviewing notes from Dr. Earlene Plater and from the time I saw her, I would be able to write a letter explaining her condition for her job without having to make and office visit.

## 2011-04-08 NOTE — Telephone Encounter (Signed)
Fwd. To Dr.Matthews (do not see letter?)

## 2011-04-08 NOTE — Telephone Encounter (Signed)
Letter completed, sent to admin to be printed and patient to pick up.

## 2011-04-08 NOTE — Telephone Encounter (Signed)
Angela Walls is calling back to see if the letter was ready for pick up.  I let her know that it was not and that I would let Dr. Ashley Royalty know that she has called.

## 2011-06-26 ENCOUNTER — Telehealth: Payer: Self-pay | Admitting: Family Medicine

## 2011-06-26 DIAGNOSIS — M87059 Idiopathic aseptic necrosis of unspecified femur: Secondary | ICD-10-CM

## 2011-06-26 NOTE — Telephone Encounter (Signed)
Need refill on hydrocodone/  Call when ready for pickup or fax to [facility] on [address]

## 2011-06-28 NOTE — Telephone Encounter (Signed)
Angela Walls is calling back because it has been 2 days since she last called and she has not gotten a call back or anything on her pain medicine.  She would like a call today.

## 2011-07-01 MED ORDER — HYDROCODONE-ACETAMINOPHEN 10-660 MG PO TABS: 1.0000 | ORAL_TABLET | Freq: Four times a day (QID) | ORAL | Status: DC | PRN

## 2011-07-01 NOTE — Telephone Encounter (Signed)
Completed, ready for pick up

## 2011-08-23 ENCOUNTER — Ambulatory Visit (INDEPENDENT_AMBULATORY_CARE_PROVIDER_SITE_OTHER): Payer: 59 | Admitting: Family Medicine

## 2011-08-23 ENCOUNTER — Encounter: Payer: Self-pay | Admitting: Family Medicine

## 2011-08-23 VITALS — BP 129/83 | HR 74 | Ht 65.0 in | Wt 163.1 lb

## 2011-08-23 DIAGNOSIS — M329 Systemic lupus erythematosus, unspecified: Secondary | ICD-10-CM

## 2011-08-28 NOTE — Progress Notes (Signed)
  Subjective:    Patient ID: Angela Walls, female    DOB: 04-16-1981, 31 y.o.   MRN: [id number]  HPI 1. Lupus:  Here to discuss referral to another rheumatologist for her lupus.  States it is difficult to get appt. With current rheumatologist, Dr. Dareen Piano and does not like the practice. Lupus well controlled, No current lupus flares, compliant with hydroxychloroquine and prednisone.   Review of Systems     Objective:   Physical Exam  Constitutional: She appears well-developed and well-nourished. No distress.          Assessment & Plan:

## 2011-08-28 NOTE — Assessment & Plan Note (Signed)
Will refer to a different theumatologist, interested in seeing dr. Kellie Simmering or levitin.

## 2011-09-04 ENCOUNTER — Ambulatory Visit: Payer: 59 | Admitting: Family Medicine

## 2011-09-05 ENCOUNTER — Other Ambulatory Visit: Payer: Self-pay | Admitting: Family Medicine

## 2011-09-05 MED ORDER — LABETALOL HCL 300 MG PO TABS: 300.0000 mg | ORAL_TABLET | Freq: Two times a day (BID) | ORAL | Status: DC

## 2011-09-05 NOTE — Progress Notes (Signed)
Received faxed refill request for labetalol.  Sent to wrong pharmacy initially, changed and re-sent to [facility] outpatient pharmacy.

## 2011-09-11 ENCOUNTER — Encounter: Payer: Self-pay | Admitting: Family Medicine

## 2011-09-11 ENCOUNTER — Ambulatory Visit (INDEPENDENT_AMBULATORY_CARE_PROVIDER_SITE_OTHER): Payer: 59 | Admitting: Family Medicine

## 2011-09-11 VITALS — BP 120/79 | HR 71 | Ht 65.0 in | Wt 163.0 lb

## 2011-09-11 DIAGNOSIS — IMO0001 Reserved for inherently not codable concepts without codable children: Secondary | ICD-10-CM

## 2011-09-11 DIAGNOSIS — Z309 Encounter for contraceptive management, unspecified: Secondary | ICD-10-CM | POA: Insufficient documentation

## 2011-09-11 LAB — POCT URINE PREGNANCY: Preg Test, Ur: NEGATIVE

## 2011-09-11 MED ORDER — MEDROXYPROGESTERONE ACETATE 150 MG/ML IM SUSP
150.0000 mg | Freq: Once | INTRAMUSCULAR | Status: AC
  Administered 2011-09-11: 150 mg via INTRAMUSCULAR

## 2011-09-11 NOTE — Progress Notes (Signed)
  Subjective:    Patient ID: Angela Walls, female    DOB: September 26, 1980, 31 y.o.   MRN: [id number]  HPI  1. Contraception management:  Would like to restart Depo Provera shot.  Was on x2 years up until April of last year.  Stopped because she want to "have her cycle for a while." No side effects previously, worked well for her.  LMP 2 weeks ago.  Cycles have been normal since d/c of depo   Review of Systems     Objective:   Physical Exam  Constitutional: She appears well-nourished. No distress.          Assessment & Plan:

## 2011-09-11 NOTE — Patient Instructions (Signed)
Thank you for coming in today, it was good to see you You should use a condom for the next 7 days if you are sexually active You should return in 3 months for a nurse visit to get your next depo shot. Please schedule a complete physical with me in the near future so that we can do your pap smear.

## 2011-09-11 NOTE — Assessment & Plan Note (Signed)
Declined pelvic today, pap due.  Plans to make CPE appt in a couple of weeks.  UPT NEGATIVE.  Depo given today, advised to return q3 months.  Also advised to use barrier method of contraception x7 days since >7 days since LMP

## 2011-09-12 ENCOUNTER — Encounter: Payer: Self-pay | Admitting: Family Medicine

## 2011-09-12 ENCOUNTER — Ambulatory Visit (INDEPENDENT_AMBULATORY_CARE_PROVIDER_SITE_OTHER): Payer: 59 | Admitting: Family Medicine

## 2011-09-12 VITALS — BP 130/82 | HR 83 | Temp 98.0°F | Ht 65.0 in | Wt 166.0 lb

## 2011-09-12 DIAGNOSIS — H9209 Otalgia, unspecified ear: Secondary | ICD-10-CM

## 2011-09-12 NOTE — Progress Notes (Signed)
S: Pt comes in today for left ear pain.  Has been bothering her for 3-4 days.  No rhinorrhea, cough, congestion.  No fevers or chills.  Just ear pain, which is achy and on the inside of her left ear.  Has not really changed over the past few days.  Is only bothering her a little right now.  Worse when outside in cold air.  Also hurts when she eats, sometimes.  Nothing seems to make it better or go away.  Has never had this before.  No ear drainage.  No h/o ear problems, no tubes when younger.  No pain with movement of outer ear.    ROS: Per HPI  History  Smoking status  . Never Smoker   Smokeless tobacco  . Never Used    O:  Filed Vitals:   09/12/11 1422  BP: 130/82  Pulse: 83  Temp: 98 F (36.7 C)    Gen: NAD HEENT: MMM, EOMI, PERRLA, no pharyngeal erythema or exudate, no cervical LAD, no rhinorrhea, nasal mucosa normal, TMs normal bilaterally, no irritation in canal or obvious trauma, no pain with manipulation of pinna; dentition is fair, some teeth missing but gums and teeth are healthy without obvious cavity or abscess   CV: RRR, no murmur Pulm: CTA bilat, no wheezes or crackles   A/P: 31 y.o. female p/w L ear pain, unlikely to be infectious in etiology  -See problem list -f/u PRN

## 2011-09-12 NOTE — Patient Instructions (Signed)
It was nice to meet you. It does not look like you have an ear infection.  I do not think you need antibiotics. Try to keep your hood up or use ear muffs to keep the cold air out of your ear. Be really careful using Q-tips the next few days, in case there is a little scratch and that's what's causing the pain.  Come back if you start having fevers, worsening pain, drainage from your ear.

## 2011-09-12 NOTE — Assessment & Plan Note (Signed)
Unclear etiology, TMs normal and without fever or systemic or URI symptoms so unlikely to be infectious.  No obvious trauma to ear canal.  Advised caution with using Q-tips.  Worse with cold air, suggested using hood or ear muffs to keep ears warm.  F/u PRN, red flags for follow up discussed.

## 2011-10-03 ENCOUNTER — Encounter: Payer: 59 | Admitting: Family Medicine

## 2011-10-16 ENCOUNTER — Encounter: Payer: Self-pay | Admitting: Family Medicine

## 2011-10-16 ENCOUNTER — Ambulatory Visit (INDEPENDENT_AMBULATORY_CARE_PROVIDER_SITE_OTHER): Payer: 59 | Admitting: Family Medicine

## 2011-10-16 VITALS — BP 135/84 | HR 78 | Temp 98.1°F | Ht 65.5 in | Wt 159.0 lb

## 2011-10-16 DIAGNOSIS — K047 Periapical abscess without sinus: Secondary | ICD-10-CM | POA: Insufficient documentation

## 2011-10-16 MED ORDER — CLINDAMYCIN HCL 300 MG PO CAPS: 300.0000 mg | ORAL_CAPSULE | Freq: Three times a day (TID) | ORAL | Status: AC

## 2011-10-16 NOTE — Patient Instructions (Signed)
Thank you for coming in today. Please take clindamycin 3 times a day. Please see your dentist on Monday. Let me know if your swelling gets worse. Go to the emergency room if you have trouble breathing or swallowing or a high fever.

## 2011-10-16 NOTE — Assessment & Plan Note (Signed)
I suspect a dental abscess as the cause of swelling. She recently had a cavity repaired and was told that she may need a root canal.  She has an appointment with her dentist on Friday. Plan to start clindamycin as patient is allergic to penicillin.  Followup with dentist on Friday. Discussed warning signs or symptoms. Please see discharge instructions.

## 2011-10-16 NOTE — Progress Notes (Signed)
Angela Walls is a 31 y.o. female who presents to [facility] today for swelling of the upper right jaw.  Patient recently had a cavity repaired and was told that she may need a root canal. She developed swelling of the right maxillary region yesterday.  It is mildly tender to palpation but not tender otherwise. She denies any fevers or chills. She denies any trouble breathing or swallowing and no swelling of the lips or tongue.  Patient has a dental appointment on Friday.   PMH, SH reviewed: Patient has lupus and takes Plaquenil and prednisone ROS as above no fevers or chills Medications reviewed. Current Outpatient Prescriptions  Medication Sig Dispense Refill  . diclofenac sodium (VOLTAREN) 1 % GEL Apply 1 application topically 4 (four) times daily.  100 g  1  . hydrochlorothiazide 25 MG tablet Take 1 tablet (25 mg total) by mouth daily.  30 tablet  6  . Hydrocodone-Acetaminophen (VICODIN HP) 10-660 MG TABS Take 1 tablet by mouth 4 (four) times daily as needed (For Pain).  120 each  3  . hydroxychloroquine (PLAQUENIL) 200 MG tablet Take 200 mg by mouth daily.        Marland Kitchen labetalol (NORMODYNE) 300 MG tablet Take 1 tablet (300 mg total) by mouth 2 (two) times daily.  60 tablet  6  . medroxyPROGESTERone (DEPO-PROVERA) 150 MG/ML injection Inject 150 mg into the muscle every 3 (three) months.        . predniSONE (DELTASONE) 10 MG tablet Take 5 mg by mouth daily.       . clindamycin (CLEOCIN) 300 MG capsule Take 1 capsule (300 mg total) by mouth 3 (three) times daily.  30 capsule  0    Exam:  BP 135/84  Pulse 78  Temp(Src) 98.1 F (36.7 C) (Oral)  Ht 5' 5.5" (1.664 m)  Wt 159 lb (72.122 kg)  BMI 26.06 kg/m2 Gen: Well NAD HEENT: EOMI,  MMM.  Mild fullness of the right maxillary region.  Mildly tender to palpation.  Normal gums without any obvious abscess.  No results found for this or any previous visit (from the past 72 hour(s)).

## 2011-10-31 ENCOUNTER — Other Ambulatory Visit: Payer: Self-pay | Admitting: Family Medicine

## 2011-10-31 DIAGNOSIS — M87059 Idiopathic aseptic necrosis of unspecified femur: Secondary | ICD-10-CM

## 2011-10-31 MED ORDER — HYDROCODONE-ACETAMINOPHEN 10-660 MG PO TABS: 1.0000 | ORAL_TABLET | Freq: Four times a day (QID) | ORAL | Status: DC | PRN

## 2011-11-05 ENCOUNTER — Ambulatory Visit (INDEPENDENT_AMBULATORY_CARE_PROVIDER_SITE_OTHER): Payer: 59 | Admitting: Family Medicine

## 2011-11-05 ENCOUNTER — Ambulatory Visit: Payer: 59 | Admitting: Family Medicine

## 2011-11-05 ENCOUNTER — Encounter: Payer: Self-pay | Admitting: Family Medicine

## 2011-11-05 VITALS — BP 150/91 | HR 73 | Temp 98.0°F | Ht 65.0 in | Wt 156.0 lb

## 2011-11-05 DIAGNOSIS — R5381 Other malaise: Secondary | ICD-10-CM

## 2011-11-05 DIAGNOSIS — I10 Essential (primary) hypertension: Secondary | ICD-10-CM

## 2011-11-05 DIAGNOSIS — R5383 Other fatigue: Secondary | ICD-10-CM

## 2011-11-05 DIAGNOSIS — Z79899 Other long term (current) drug therapy: Secondary | ICD-10-CM

## 2011-11-05 LAB — COMPREHENSIVE METABOLIC PANEL
ALT: 8 U/L (ref 0–35)
AST: 14 U/L (ref 0–37)
Albumin: 4 g/dL (ref 3.5–5.2)
Alkaline Phosphatase: 36 U/L — ABNORMAL LOW (ref 39–117)
BUN: 11 mg/dL (ref 6–23)
CO2: 25 mEq/L (ref 19–32)
Calcium: 9.1 mg/dL (ref 8.4–10.5)
Chloride: 109 mEq/L (ref 96–112)
Creat: 0.91 mg/dL (ref 0.50–1.10)
Glucose, Bld: 70 mg/dL (ref 70–99)
Potassium: 3.6 mEq/L (ref 3.5–5.3)
Sodium: 142 mEq/L (ref 135–145)
Total Bilirubin: 0.3 mg/dL (ref 0.3–1.2)
Total Protein: 6.3 g/dL (ref 6.0–8.3)

## 2011-11-05 LAB — CBC
HCT: 32.4 % — ABNORMAL LOW (ref 36.0–46.0)
Hemoglobin: 10.5 g/dL — ABNORMAL LOW (ref 12.0–15.0)
MCH: 28.6 pg (ref 26.0–34.0)
MCHC: 32.4 g/dL (ref 30.0–36.0)
MCV: 88.3 fL (ref 78.0–100.0)
Platelets: 277 10*3/uL (ref 150–400)
RBC: 3.67 MIL/uL — ABNORMAL LOW (ref 3.87–5.11)
RDW: 13.4 % (ref 11.5–15.5)
WBC: 5.6 10*3/uL (ref 4.0–10.5)

## 2011-11-05 MED ORDER — HYDROCHLOROTHIAZIDE 25 MG PO TABS: 25.0000 mg | ORAL_TABLET | Freq: Every day | ORAL | Status: DC

## 2011-11-05 NOTE — Assessment & Plan Note (Signed)
Patient with 1 week history causing her to miss 2 days of work due to low energy. Does have a history of SLE, but states this does not feel like a flare. Recently seen by Rheumatology and worked up. Is on chronic immunosuppressive medications. Also, has history of anemia but states that was years ago. On review of labs from Rheumatology, her last HgB was 10.5. Denies any active bleeding. Will recheck CBC and Cmet today (due to chronic medications.) Patient follow up with Rheumatology on May 23. Encouraged her to give rhuem a call if her fatigue gets worse. No signs of depression. Will call patient with lab results.

## 2011-11-05 NOTE — Patient Instructions (Signed)
It was nice to meet you today. I am sorry you are not feeling well.  Please stop by the lab to get your CBC and Comprehensive Panel checked. I will call you tomorrow at [facility] to let you know the results.  Please continue to practice good sleep hygiene including going to bed early (at the same time) every night and avoiding anything that may keep you awake.   You may also consider calling your Rheumatologist to see if you can get an earlier appointment or to ask her opinion.  Please come back to the office if you have more fatigue, sore throat, aches/pains, notice any bleeding, or if you have any concerns.  Take care! Nyia Tsao M. Shulem Mader, M.D.

## 2011-11-05 NOTE — Assessment & Plan Note (Signed)
BP elevated in triage, but recheck 132/84. Reports headache, but no other symptoms. Will refill HCTZ 25mg  daily. Continue taking Labetalol 300mg  BID. Follow up with Dr. Ashley Royalty if headache does not improve, or if she begins having changes in her vision.

## 2011-11-05 NOTE — Progress Notes (Signed)
Subjective:     Patient ID: Angela Walls, female   DOB: 06-14-1981, 31 y.o.   MRN: [id number]  HPI Patient is a 31 yo F with PMH of SLE, anemia and hip pain presenting for complaint of fatigue and for a follow up of her chronic hypertension   Fatigue- Started one week ago. Since then, she has been sleeping more and had decrease in appetite. Denies fevers, rashes, anhedonia, SI/HI. Increased sleep; >8 hours. Decreased appetite. Missed 2 days of work at [employer]. 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 No energy to work with the children. Last time she felt this fatigued was when she was first diagnosed with SLE. No signs of a SLE flare. No recent illnesses or sick contacts. No signs of bleeding, but does have a history of anemia. On depo (last shot in March). States she is not pregnant.   BP- History of HTN. Ran out of HCTZ a few days ago. Would like a refill. Taking Labetalol as prescribed. BP elevated to 150/91 in triage today, recheck 132/84 . Reports headaches, which are not new. She denies changes in her vision  SH: Smoking history reviewed- nonsmoker.  Review of Systems  Constitutional: Negative for fever and chills.  HENT: Negative for congestion and sore throat.   Eyes: Negative for visual disturbance.  Respiratory: Negative for cough and shortness of breath.   Cardiovascular: Negative for chest pain.  Gastrointestinal: Negative for abdominal pain.  Genitourinary: Negative for dysuria.  Musculoskeletal: Negative for myalgias and arthralgias.  Skin: Negative for rash.  Neurological: Positive for headaches.       Objective:   Physical Exam  Constitutional: She is oriented to person, place, and time. She appears well-developed and well-nourished. No distress (Appears tired, but awake and alert.).  Cardiovascular: Normal rate and regular rhythm.   Murmur heard. Pulmonary/Chest: Effort normal and breath sounds normal. She has no wheezes.  Abdominal: Soft. She exhibits no  distension. There is no tenderness.  Neurological: She is alert and oriented to person, place, and time.  Skin: Skin is warm and dry. No rash noted.  Psychiatric: She has a normal mood and affect. Her behavior is normal.      Assessment:     31 yo with fatigue    Plan:

## 2011-11-06 ENCOUNTER — Encounter: Payer: Self-pay | Admitting: Family Medicine

## 2011-11-08 ENCOUNTER — Other Ambulatory Visit: Payer: Self-pay | Admitting: Family Medicine

## 2011-11-08 MED ORDER — PREDNISONE 10 MG PO TABS: 5.0000 mg | ORAL_TABLET | Freq: Every day | ORAL | Status: DC

## 2011-11-08 NOTE — Telephone Encounter (Signed)
Patient is calling needing a refill on her Prednisone.  The MD that normally prescribes it will be out for 2 weeks and she is hoping that Dr. Ashley Royalty will send a refill to [facility] since she is out since yesterday.  Please call her to let her know when this is done.

## 2011-11-08 NOTE — Telephone Encounter (Signed)
Dr. Nickola Major (Rheumatologist) out of office until next week, now out of prednisone.  Takes 5mg  daily, chronically for SLE.  To avoid adrenal insufficiency will send in two weeks worth until she sees Dr. Nickola Major.

## 2011-11-15 ENCOUNTER — Telehealth: Payer: Self-pay | Admitting: Family Medicine

## 2011-11-15 ENCOUNTER — Encounter: Payer: Self-pay | Admitting: *Deleted

## 2011-11-15 NOTE — Telephone Encounter (Signed)
Got a call from her rheumatologist and they rescheduled her appt for 6/23 - only has 20 days on her prednisone and is not sure what to do.  Is also out of her plaquenil   [facility]

## 2011-11-15 NOTE — Telephone Encounter (Signed)
Will route note to Dr. Ashley Royalty.

## 2011-11-15 NOTE — Telephone Encounter (Signed)
This encounter was created in error - please disregard.

## 2011-11-15 NOTE — Telephone Encounter (Signed)
Per Dr. Asa Lente only given a limited dose of prednisone since she was changing to a new rheumatologist.  Patient will need to discuss with rheumatologist her Plaquenil refill and whether she needs to continue on Prednisone.  Return call to patient and informed she needs to contact her rheumatologist.   Gaylene Brooks, RN

## 2011-12-09 ENCOUNTER — Encounter: Payer: Self-pay | Admitting: Family Medicine

## 2011-12-09 ENCOUNTER — Ambulatory Visit (INDEPENDENT_AMBULATORY_CARE_PROVIDER_SITE_OTHER): Payer: 59 | Admitting: Family Medicine

## 2011-12-09 VITALS — BP 123/84 | HR 80 | Ht 65.0 in | Wt 155.0 lb

## 2011-12-09 DIAGNOSIS — L723 Sebaceous cyst: Secondary | ICD-10-CM

## 2011-12-09 DIAGNOSIS — Z309 Encounter for contraceptive management, unspecified: Secondary | ICD-10-CM

## 2011-12-09 LAB — POCT URINE PREGNANCY: Preg Test, Ur: NEGATIVE

## 2011-12-09 MED ORDER — MEDROXYPROGESTERONE ACETATE 150 MG/ML IM SUSP
150.0000 mg | Freq: Once | INTRAMUSCULAR | Status: AC
  Administered 2011-12-09: 150 mg via INTRAMUSCULAR

## 2011-12-09 NOTE — Assessment & Plan Note (Signed)
Likely a sebaceous cyst. As it is on the chest wall I feel that removal in the office today is not the best idea.  Plan to send to Gen. surgery for removal.  Patient expresses understanding

## 2011-12-09 NOTE — Patient Instructions (Addendum)
Thank you for coming in today. We will call you about your appointment times with the surgeon.  Expect a call in a week or so.  Please see Dr. Ashley Royalty as needed.   Epidermal Cyst An epidermal cyst is sometimes called a sebaceous cyst, epidermal inclusion cyst, or infundibular cyst. These cysts usually contain a substance that looks "pasty" or "cheesy" and may have a bad smell. This substance is a protein called keratin. Epidermal cysts are usually found on the face, neck, or trunk. They may also occur in the vaginal area or other parts of the genitalia of both men and women. Epidermal cysts are usually small, painless, slow-growing bumps or lumps that move freely under the skin. It is important not to try to pop them. This may cause an infection and lead to tenderness and swelling. CAUSES   Epidermal cysts may be caused by a deep penetrating injury to the skin or a plugged hair follicle, often associated with acne. SYMPTOMS   Epidermal cysts can become inflamed and cause:  Redness.   Tenderness.   Increased temperature of the skin over the bumps or lumps.   Grayish-white, bad smelling material that drains from the bump or lump.  DIAGNOSIS   Epidermal cysts are easily diagnosed by your caregiver during an exam. Rarely, a tissue sample (biopsy) may be taken to rule out other conditions that may resemble epidermal cysts. TREATMENT    Epidermal cysts often get better and disappear on their own. They are rarely ever cancerous.   If a cyst becomes infected, it may become inflamed and tender. This may require opening and draining the cyst. Treatment with antibiotics may be necessary. When the infection is gone, the cyst may be removed with minor surgery.   Small, inflamed cysts can often be treated with antibiotics or by injecting steroid medicines.   Sometimes, epidermal cysts become large and bothersome. If this happens, surgical removal in your caregiver's office may be necessary.  HOME  CARE INSTRUCTIONS  Only take over-the-counter or prescription medicines as directed by your caregiver.   Take your antibiotics as directed. Finish them even if you start to feel better.  SEEK MEDICAL CARE IF:    Your cyst becomes tender, red, or swollen.   Your condition is not improving or is getting worse.   You have any other questions or concerns.  MAKE SURE YOU:  Understand these instructions.   Will watch your condition.   Will get help right away if you are not doing well or get worse.  Document Released: 05/11/2004 Document Revised: 05/30/2011 Document Reviewed: 12/17/2010 [facility] Patient Information 2012 [place], [place].

## 2011-12-09 NOTE — Progress Notes (Signed)
Angela Walls is a 31 y.o. female who presents to [facility] today for cyst on chest.  Patient has a cyst on her chest wall between both breasts that has been present for about a year.  It is not particularly painful however it is growing and becoming somewhat bothersome.  She feels well that any fevers.  She denies any breast changes or nipple discharge. She would like a cyst removed.  Additionally she would like to resume Depo-Provera today.   PMH: Reviewed significant for lupus History  Substance Use Topics  . Smoking status: Never Smoker   . Smokeless tobacco: Never Used  . Alcohol Use: No   ROS as above  Medications reviewed. Current Outpatient Prescriptions  Medication Sig Dispense Refill  . diclofenac sodium (VOLTAREN) 1 % GEL Apply 1 application topically 4 (four) times daily.  100 g  1  . hydrochlorothiazide (HYDRODIURIL) 25 MG tablet Take 1 tablet (25 mg total) by mouth daily.  30 tablet  6  . Hydrocodone-Acetaminophen (VICODIN HP) 10-660 MG TABS Take 1 tablet by mouth 4 (four) times daily as needed (For Pain).  120 each  3  . hydroxychloroquine (PLAQUENIL) 200 MG tablet Take 200 mg by mouth daily.        Marland Kitchen labetalol (NORMODYNE) 300 MG tablet Take 1 tablet (300 mg total) by mouth 2 (two) times daily.  60 tablet  6  . medroxyPROGESTERone (DEPO-PROVERA) 150 MG/ML injection Inject 150 mg into the muscle every 3 (three) months.        . predniSONE (DELTASONE) 10 MG tablet Take 0.5 tablets (5 mg total) by mouth daily.  10 tablet  0    Exam:  BP 123/84  Pulse 80  Ht 5\' 5"  (1.651 m)  Wt 155 lb (70.308 kg)  BMI 25.79 kg/m2 Gen: Well NAD SKIN: 1.5 cm cystic structure between the breasts on the chest.  Firm and nontender without any erythema.  It is mobile and spherical.  No results found for this or any previous visit (from the past 72 hour(s)).

## 2011-12-10 ENCOUNTER — Encounter: Payer: Self-pay | Admitting: Family Medicine

## 2011-12-31 ENCOUNTER — Ambulatory Visit (INDEPENDENT_AMBULATORY_CARE_PROVIDER_SITE_OTHER): Payer: Self-pay | Admitting: Surgery

## 2012-01-03 ENCOUNTER — Ambulatory Visit (INDEPENDENT_AMBULATORY_CARE_PROVIDER_SITE_OTHER): Payer: Self-pay | Admitting: Surgery

## 2012-01-07 ENCOUNTER — Encounter (INDEPENDENT_AMBULATORY_CARE_PROVIDER_SITE_OTHER): Payer: Self-pay | Admitting: Surgery

## 2012-03-05 ENCOUNTER — Telehealth: Payer: Self-pay | Admitting: Family Medicine

## 2012-03-05 DIAGNOSIS — M87059 Idiopathic aseptic necrosis of unspecified femur: Secondary | ICD-10-CM

## 2012-03-05 NOTE — Telephone Encounter (Signed)
Forward to PCP for pain med request, does patient need office visit?

## 2012-03-05 NOTE — Telephone Encounter (Signed)
Need refill on her hydrocodone.  Please let her know when ready.

## 2012-03-06 MED ORDER — HYDROCODONE-ACETAMINOPHEN 10-660 MG PO TABS: 1.0000 | ORAL_TABLET | Freq: Four times a day (QID) | ORAL | Status: DC | PRN

## 2012-03-06 NOTE — Telephone Encounter (Signed)
Pt is completely out and needs enough until appt  [facility]

## 2012-03-06 NOTE — Addendum Note (Signed)
Addended by: Everrett Coombe on: 03/06/2012 02:36 PM   Modules accepted: Orders

## 2012-03-06 NOTE — Telephone Encounter (Signed)
Left message for patient to return call. She needs to be seen in the office for refills on pain meds, has an appointment with Dr Ashley Royalty on 03/25/12.Busick, [name]

## 2012-03-06 NOTE — Telephone Encounter (Signed)
Order entered for vicodin #60 with 0 refills

## 2012-03-06 NOTE — Telephone Encounter (Signed)
Will forward back to Dr. Ashley Royalty.

## 2012-03-06 NOTE — Telephone Encounter (Signed)
Patient notified . Rx verbally called to pharmacy.

## 2012-03-06 NOTE — Telephone Encounter (Signed)
Last OV with me in March, will need to see me prior to refill.  If she is completely out I will send in two weeks.

## 2012-03-25 ENCOUNTER — Encounter: Payer: 59 | Admitting: Family Medicine

## 2012-03-27 ENCOUNTER — Ambulatory Visit (INDEPENDENT_AMBULATORY_CARE_PROVIDER_SITE_OTHER): Payer: Self-pay | Admitting: Family Medicine

## 2012-03-27 ENCOUNTER — Encounter: Payer: Self-pay | Admitting: Family Medicine

## 2012-03-27 VITALS — BP 133/81 | HR 71 | Ht 65.0 in | Wt 157.0 lb

## 2012-03-27 DIAGNOSIS — Z309 Encounter for contraceptive management, unspecified: Secondary | ICD-10-CM

## 2012-03-27 DIAGNOSIS — M87059 Idiopathic aseptic necrosis of unspecified femur: Secondary | ICD-10-CM

## 2012-03-27 DIAGNOSIS — M329 Systemic lupus erythematosus, unspecified: Secondary | ICD-10-CM

## 2012-03-27 MED ORDER — HYDROXYCHLOROQUINE SULFATE 200 MG PO TABS: 200.0000 mg | ORAL_TABLET | Freq: Every day | ORAL | Status: DC

## 2012-03-27 MED ORDER — HYDROCODONE-ACETAMINOPHEN 10-325 MG PO TABS: 1.0000 | ORAL_TABLET | Freq: Four times a day (QID) | ORAL | Status: DC | PRN

## 2012-03-29 NOTE — Progress Notes (Signed)
  Subjective:    Patient ID: Angela Walls, female    DOB: April 16, 1981, 31 y.o.   MRN: [id number]  HPI 1. Chronic pain:   Here for follow up of chronic pain.  Needs refill on hydrocodone.   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 Please circle the one percentage that shows how much relief you have received: 0%__ 10%__ 20%__ 30%__ 40%__ 50%__ 60%__ 70%_x_80%__ 90%__ 100%__ Loraine Leriche the one number that describes how, during the past 24 hours, pain has interfered with your: General Activity 0__ 1__ 2_x_ 3__ 4__ 5__ 6__ 7__ 8__ 9__ 10__ Does not interfere      Completely interferes Mood 0__ 1__ 2_x_ 3__ 4__ 5__ 6__ 7__ 8__ 9__ 10__ Does not interfere      Completely interferes Ability to work (in or out of the home) 0__ 1__ 2_x_ 3__ 4__ 5__ 6__ 7__ 8__ 9__ 10__ Does not interfere      Completely interferes Interactions with other people 0__ 1__ 2_x_ 3__ 4__ 5__ 6__ 7__ 8__ 9__ 10__ Does not interfere      Completely interferes Sleep 0__ 1_x_ 2__ 3__ 4__ 5__ 6__ 7__ 8__ 9__ 10__ Does not interfere      Completely interferes  Enjoyment of life 0__ 1__ 2__ 3_x_ 4__ 5__ 6__ 7__ 8__ 9__ 10__ Does not interfere      Completely interferes  2. SLE:  Lost job over the summer, and lost insurance.  Unable to afford f/u at rheumatologist.  Has taken herself off prednisone, last dose a couple of months ago.  Does not fill any different off this medication.  She is still taking plaquenil, but needs refill.  Right now disease is stable.    3.  Contraception mgmt:  Has been using depo, overdue for repeat at this  point.  She can not afford a pregnancy test and depo at this time.  She denies being sexually active right now.     Review of Systems Denies fever, chills, joint swelling, myalgias, constipation.     Objective:   Physical Exam  Constitutional: She appears well-nourished. No distress.  Musculoskeletal: She exhibits no edema.       Walks with slight limp.    Neurological: She is alert.          Assessment & Plan:

## 2012-03-29 NOTE — Assessment & Plan Note (Signed)
Pain well controlled by current regiment.  Refilled hydrocodone.  Reduced acetaminophen to 325mg  per tab.

## 2012-03-29 NOTE — Assessment & Plan Note (Signed)
Past due for repeat depo.  Would like to restart depo, however can not afford this and pregnancy test at this time as she is unemployed and uninsured.  Advised use of back up method (condoms) to prevent pregnancy.

## 2012-03-29 NOTE — Assessment & Plan Note (Signed)
Was being followed by Rheum but lost her insurance and can not afford to return at this time.  She is hoping to qualify for medicaid and return when she gets this.  I told her that I would fill her plaquenil for this month until she is able to see rheum again.  I am not inclined to restart her prednisone as she has been off two months, no signs of adrenal insufficiency.

## 2012-07-15 ENCOUNTER — Ambulatory Visit (INDEPENDENT_AMBULATORY_CARE_PROVIDER_SITE_OTHER): Payer: Self-pay | Admitting: Family Medicine

## 2012-07-15 ENCOUNTER — Encounter: Payer: Self-pay | Admitting: Family Medicine

## 2012-07-15 VITALS — BP 156/104 | HR 74 | Ht 65.0 in | Wt 160.0 lb

## 2012-07-15 DIAGNOSIS — Z309 Encounter for contraceptive management, unspecified: Secondary | ICD-10-CM

## 2012-07-15 DIAGNOSIS — M329 Systemic lupus erythematosus, unspecified: Secondary | ICD-10-CM

## 2012-07-15 DIAGNOSIS — M25559 Pain in unspecified hip: Secondary | ICD-10-CM

## 2012-07-15 DIAGNOSIS — Z23 Encounter for immunization: Secondary | ICD-10-CM

## 2012-07-15 DIAGNOSIS — I10 Essential (primary) hypertension: Secondary | ICD-10-CM

## 2012-07-15 LAB — POCT URINE PREGNANCY: Preg Test, Ur: NEGATIVE

## 2012-07-15 MED ORDER — MEDROXYPROGESTERONE ACETATE 150 MG/ML IM SUSP
150.0000 mg | Freq: Once | INTRAMUSCULAR | Status: AC
  Administered 2012-07-15: 150 mg via INTRAMUSCULAR

## 2012-07-15 MED ORDER — HYDROCODONE-ACETAMINOPHEN 10-325 MG PO TABS: 1.0000 | ORAL_TABLET | Freq: Four times a day (QID) | ORAL | Status: DC | PRN

## 2012-07-15 NOTE — Assessment & Plan Note (Signed)
Pregnancy test negative, depo given today.

## 2012-07-15 NOTE — Assessment & Plan Note (Signed)
Advised that I can refer her to another rheumatologist once she brings in her insurance card.

## 2012-07-15 NOTE — Progress Notes (Signed)
Subjective:    Patient ID: Angela Walls, female    DOB: [birth year], 32 y.o.   MRN: [id number]  HPI  1. HTN: CHRONIC HYPERTENSION  Disease Monitoring  Blood pressure range: Does not typically monitor at home.  Thinks pressure up today 2/2 to pain.  Chest pain: no   Dyspnea: no   Claudication: no   Medication compliance: yes, using HCTZ and labetalol. Medication Side Effects  Lightheadedness: no   Urinary frequency: no   Edema: no   Preventitive Healthcare:  Exercise: yes, walking some but limited by pain currently   Diet Pattern: generally healthy  Salt Restriction: Does not restrict but does not add additional salt.     2. 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  Out of medications x2 weeks, using motrin which does not help much.  Has new job at [employer] office that requires long periods of standing. Pain scale rating at its BEST in the past 24 hours (1 to 10): 6-7  Pain scale rating at its WORST in the past 24 hours (1 to 10): 8-9 Adverse Effects:  (None_x_ Nausea__ Vomiting__ Confusion__ Sleepiness__ Fatigue__ Constipation__ Other__) Treatment of Adverse Effects: N/a Since the last clinic visit, how much relief have pain treatment and medication provided? Please circle the one percentage that shows how much relief you have received: 0%__ 10%__ 20%__ 30%__ 40%__ 50%__ 60%__ 70%__ 80%_x_90%__ 100%__ Loraine Leriche the one number that describes how, during the past 24 hours, pain has interfered with your: General Activity 0__ 1__ 2__ 3__ 4__ 5__ 6__ 7__ 8_x_ 9__ 10__ Does not interfere      Completely interferes Mood 0__ 1__ 2__ 3__ 4__ 5_x_ 6__ 7__ 8__ 9__ 10__ Does not interfere      Completely interferes Ability to work (in or out of the home) 0__ 1__ 2__ 3__ 4__ 5_x_ 6__ 7__ 8__ 9__ 10__ Does not interfere      Completely interferes Interactions with other  people 0__ 1__ 2__ 3_x_ 4__ 5__ 6__ 7__ 8__ 9__ 10__ Does not interfere      Completely interferes Sleep 0__ 1__ 2__ 3__ 4__ 5__ 6_x_ 7__ 8__ 9__ 10__ Does not interfere      Completely interferes  Enjoyment of life 0__ 1__ 2__ 3__ 4__ 5__ 6_x_ 7__ 8__ 9__ 10__ Does not interfere      Completely interferes   3. Contraception:  Previously on Depo provera, but unable to afford after she lost her insurance.  Says she has insurance through her new job and would like to restart.  Denies that she could be pregnant.   History reviewed and updated as needed. History   Social History  . Marital Status: Single    Spouse Name: N/A    Number of Children: N/A  . Years of Education: N/A   Occupational History  . 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 Smoking status: Never Smoker   . Smokeless tobacco: Never Used  . Alcohol Use: No  . Drug Use: No  . Sexually Active: Yes    Birth Control/ Protection: None   Other Topics Concern  . Not on file   Social History Narrative   Lives with 31 year old and 31 year old.  In a relationship with the 50 year old's father.Bachelor's in [employer].  Works at [employer] now.      Review of Systems Per HPI  Objective:   Physical Exam  Constitutional: She appears well-nourished. No distress.  HENT:  Head: Normocephalic and atraumatic.  Neck: Neck supple. No thyromegaly present.  Cardiovascular: Normal rate and regular rhythm.   Pulmonary/Chest: Effort normal and breath sounds normal.  Musculoskeletal: She exhibits no edema.  Neurological: She is alert.          Assessment & Plan:

## 2012-07-15 NOTE — Assessment & Plan Note (Signed)
BP elevated today but has been well controlled in the past.  May be 2/2 to pain today.  Continue current medications and follow up in three months.  If remains elevated can consider addition of 3rd agent.

## 2012-07-15 NOTE — Patient Instructions (Signed)
Thank you for coming in today, it was good to see you Continue your current medications Bring in your new insurance card so that I can refer you to the rheumatologist.  Follow up with me in 3 months.

## 2012-07-15 NOTE — Assessment & Plan Note (Addendum)
Pain well controlled with current regiment.  Refilled hydrocodone. Eventually will need to be referred to Ortho to discuss further options t if she is having enough pain to require chronic narcotics.

## 2012-07-21 ENCOUNTER — Telehealth: Payer: Self-pay | Admitting: Family Medicine

## 2012-07-21 NOTE — Telephone Encounter (Signed)
Will fwd. To PCP for review .Angela Walls  

## 2012-07-21 NOTE — Telephone Encounter (Signed)
Since she Lupus she is asking what her doctor can recommend to help her to increase her appetite.  What can she do?

## 2012-07-22 NOTE — Telephone Encounter (Signed)
Left message for patient to return call, please see message from Dr Ashley Royalty.Angela Walls, [name]

## 2012-07-22 NOTE — Telephone Encounter (Signed)
It does not appear she needs anything for appetite stimulation.  Her weight looks ok, actually up 3 lbs from visit last October and Depo that she recently received may increase appetite some.

## 2012-08-31 ENCOUNTER — Ambulatory Visit (INDEPENDENT_AMBULATORY_CARE_PROVIDER_SITE_OTHER): Payer: BC Managed Care – PPO | Admitting: Surgery

## 2012-08-31 ENCOUNTER — Encounter (INDEPENDENT_AMBULATORY_CARE_PROVIDER_SITE_OTHER): Payer: Self-pay | Admitting: Surgery

## 2012-08-31 VITALS — BP 134/88 | HR 74 | Temp 98.8°F | Resp 16 | Ht 65.0 in | Wt 157.2 lb

## 2012-08-31 DIAGNOSIS — L723 Sebaceous cyst: Secondary | ICD-10-CM

## 2012-08-31 DIAGNOSIS — L720 Epidermal cyst: Secondary | ICD-10-CM | POA: Insufficient documentation

## 2012-08-31 NOTE — Progress Notes (Signed)
Patient ID: Angela Walls, female   DOB: 1980/10/24, 32 y.o.   MRN: [id number]  Chief Complaint  Patient presents with  . New Evaluation    eval seb cyst on chest    HPI Sherly D Gerard is a 32 y.o. female.  Patient sent at request of Dr. Ashley Royalty for cyst on chest. It has been present for a number of months. Is getting larger. It is tender. There is no redness or drainage. HPI  Past Medical History  Diagnosis Date  . Lupus     Followed by Rheum. On chronic Prednisone.  . Hypertension   . Anemia   . Avascular necrosis of femoral head     Bilateral. On chronic narcotics for this.    Past Surgical History  Procedure Laterality Date  . Appendectomy  2001  . Cesarean section  2010    Family History  Problem Relation Age of Onset  . Hypertension Maternal Grandmother   . Hypertension Paternal Grandmother   . Stroke Neg Hx   . Heart disease Neg Hx   . Cancer Neg Hx     Social History History  Substance Use Topics  . Smoking status: Never Smoker   . Smokeless tobacco: Never Used  . Alcohol Use: No    Allergies  Allergen Reactions  . Penicillins Hives and Swelling    Current Outpatient Prescriptions  Medication Sig Dispense Refill  . hydrochlorothiazide (HYDRODIURIL) 25 MG tablet Take 1 tablet (25 mg total) by mouth daily.  30 tablet  6  . hydroxychloroquine (PLAQUENIL) 200 MG tablet Take 1 tablet (200 mg total) by mouth daily.  30 tablet  1  . labetalol (NORMODYNE) 300 MG tablet Take 1 tablet (300 mg total) by mouth 2 (two) times daily.  60 tablet  6  . predniSONE (DELTASONE) 5 MG tablet Take 5 mg by mouth daily.       No current facility-administered medications for this visit.    Review of Systems Review of Systems  Constitutional: Negative for fever, chills and unexpected weight change.  HENT: Negative for hearing loss, congestion, sore throat, trouble swallowing and voice change.   Eyes: Negative for visual disturbance.  Respiratory: Negative for cough and  wheezing.   Cardiovascular: Negative for chest pain, palpitations and leg swelling.  Gastrointestinal: Negative for nausea, vomiting, abdominal pain, diarrhea, constipation, blood in stool, abdominal distention and anal bleeding.  Genitourinary: Negative for hematuria, vaginal bleeding and difficulty urinating.  Musculoskeletal: Negative for arthralgias.  Skin: Negative for rash and wound.  Neurological: Negative for seizures, syncope and headaches.  Hematological: Negative for adenopathy. Does not bruise/bleed easily.  Psychiatric/Behavioral: Negative for confusion.    Blood pressure 134/88, pulse 74, temperature 98.8 F (37.1 C), temperature source Temporal, resp. rate 16, height 5\' 5"  (1.651 m), weight 157 lb 3.2 oz (71.305 kg), SpO2 98.00%.  Physical Exam Physical Exam  Constitutional: She is oriented to person, place, and time. She appears well-developed and well-nourished.  HENT:  Head: Normocephalic and atraumatic.  Eyes: EOM are normal. Pupils are equal, round, and reactive to light.  Neck: Normal range of motion. Neck supple.  Pulmonary/Chest: Effort normal and breath sounds normal.    Musculoskeletal: Normal range of motion.  Neurological: She is alert and oriented to person, place, and time.  Skin: Skin is warm and dry.  Psychiatric: She has a normal mood and affect. Her behavior is normal. Judgment normal.      Assessment    3 cm x 3 cm epidural  inclusion cyst midline central chest symptomatic    Plan    Patient would like to have this excised. Risk of bleeding, infection, recurrence discussed. She understands and agrees to proceed.       CORNETT,THOMAS A. 08/31/2012, 11:52 AM

## 2012-08-31 NOTE — Patient Instructions (Addendum)

## 2012-09-02 ENCOUNTER — Encounter: Payer: Self-pay | Admitting: Family Medicine

## 2012-09-07 ENCOUNTER — Telehealth: Payer: Self-pay | Admitting: Family Medicine

## 2012-09-07 NOTE — Telephone Encounter (Signed)
Pt needs back of disability formed filled out

## 2012-09-07 NOTE — Telephone Encounter (Signed)
Pt needs back of disability form filled out

## 2012-09-11 ENCOUNTER — Encounter: Payer: Self-pay | Admitting: Family Medicine

## 2012-09-14 NOTE — Telephone Encounter (Signed)
Left message to return call, please see message from Dr Ashley Royalty.Angela Walls, [name]

## 2012-09-14 NOTE — Telephone Encounter (Signed)
Have the form but I am not aware of any disability she has had that would cause her to miss work or need disability form completed. Please try to clarify with her.

## 2012-09-14 NOTE — Telephone Encounter (Signed)
Pt called back and needs something stated that she had Lupus back in December and she has disability insurance and just needed it filled out for her insurance for that time.

## 2012-09-17 NOTE — Telephone Encounter (Signed)
I do not have any records of her being seen during December or being hospitalized during that time.  If she was hospitalized somewhere else please have her send the records to our office.  Otherwise I can not complete this for her because I did not care for her during that time.

## 2012-09-18 NOTE — Telephone Encounter (Signed)
Unable to reach patient, phone number is incorrect in chart. If patient returns call please read message to her from Dr Ashley Royalty.Busick, [name]

## 2012-09-22 ENCOUNTER — Other Ambulatory Visit (HOSPITAL_COMMUNITY)
Admission: RE | Admit: 2012-09-22 | Discharge: 2012-09-22 | Disposition: A | Discharge status: Home / Self Care | Payer: BC Managed Care – PPO | Source: Ambulatory Visit | Attending: Family Medicine | Admitting: Family Medicine

## 2012-09-22 ENCOUNTER — Ambulatory Visit (INDEPENDENT_AMBULATORY_CARE_PROVIDER_SITE_OTHER): Payer: BC Managed Care – PPO | Admitting: Family Medicine

## 2012-09-22 VITALS — BP 130/80 | HR 80 | Ht 65.0 in | Wt 157.9 lb

## 2012-09-22 DIAGNOSIS — Z124 Encounter for screening for malignant neoplasm of cervix: Secondary | ICD-10-CM

## 2012-09-22 DIAGNOSIS — Z1151 Encounter for screening for human papillomavirus (HPV): Secondary | ICD-10-CM | POA: Insufficient documentation

## 2012-09-22 DIAGNOSIS — M329 Systemic lupus erythematosus, unspecified: Secondary | ICD-10-CM

## 2012-09-22 DIAGNOSIS — N898 Other specified noninflammatory disorders of vagina: Secondary | ICD-10-CM

## 2012-09-22 DIAGNOSIS — I10 Essential (primary) hypertension: Secondary | ICD-10-CM

## 2012-09-22 DIAGNOSIS — Z01419 Encounter for gynecological examination (general) (routine) without abnormal findings: Secondary | ICD-10-CM | POA: Insufficient documentation

## 2012-09-22 LAB — POCT WET PREP (WET MOUNT)
Clue Cells Wet Prep Whiff POC: NEGATIVE
WBC, Wet Prep HPF POC: >20

## 2012-09-22 LAB — COMPREHENSIVE METABOLIC PANEL
ALT: <8 U/L (ref 0–35)
AST: 14 U/L (ref 0–37)
Albumin: 4.1 g/dL (ref 3.5–5.2)
Alkaline Phosphatase: 40 U/L (ref 39–117)
BUN: 13 mg/dL (ref 6–23)
CO2: 26 mEq/L (ref 19–32)
Calcium: 9 mg/dL (ref 8.4–10.5)
Chloride: 104 mEq/L (ref 96–112)
Creat: 1 mg/dL (ref 0.50–1.10)
Glucose, Bld: 85 mg/dL (ref 70–99)
Potassium: 3.6 mEq/L (ref 3.5–5.3)
Sodium: 136 mEq/L (ref 135–145)
Total Bilirubin: 0.5 mg/dL (ref 0.3–1.2)
Total Protein: 6.7 g/dL (ref 6.0–8.3)

## 2012-09-22 LAB — CBC
HCT: 33.9 % — ABNORMAL LOW (ref 36.0–46.0)
Hemoglobin: 11.5 g/dL — ABNORMAL LOW (ref 12.0–15.0)
MCH: 28.8 pg (ref 26.0–34.0)
MCHC: 33.9 g/dL (ref 30.0–36.0)
MCV: 85 fL (ref 78.0–100.0)
Platelets: 296 10*3/uL (ref 150–400)
RBC: 3.99 MIL/uL (ref 3.87–5.11)
RDW: 14.4 % (ref 11.5–15.5)
WBC: 6.3 10*3/uL (ref 4.0–10.5)

## 2012-09-22 NOTE — Patient Instructions (Signed)
Thank you for coming in today, it was good to see you Follow up with your surgeon to discuss if the procedure has been planned I will place another referral to rheumatology for you

## 2012-09-22 NOTE — Progress Notes (Signed)
Patient ID: Angela Walls, female   DOB: 1980-11-16, 32 y.o.   MRN: [id number] SUBJECTIVE:  32 y.o. female for annual routine Pap and checkup. History of abnormal pap with LSIL.  Also with history of SLE, not currently followed by Rheum. Current Outpatient Prescriptions  Medication Sig Dispense Refill  . hydrochlorothiazide (HYDRODIURIL) 25 MG tablet Take 1 tablet (25 mg total) by mouth daily.  30 tablet  6  . hydroxychloroquine (PLAQUENIL) 200 MG tablet Take 1 tablet (200 mg total) by mouth daily.  30 tablet  1  . labetalol (NORMODYNE) 300 MG tablet Take 1 tablet (300 mg total) by mouth 2 (two) times daily.  60 tablet  6  . predniSONE (DELTASONE) 5 MG tablet Take 5 mg by mouth daily.       No current facility-administered medications for this visit.   Allergies: Penicillins  No LMP recorded. Patient has had an injection.  ROS:  Feeling well. No dyspnea or chest pain on exertion.  No abdominal pain, change in bowel habits, black or bloody stools.  No urinary tract symptoms. GYN ROS: Intermittent spotting, on Depo. No neurological complaints.  OBJECTIVE:  The patient appears well, alert, oriented x 3, in no distress. BP 130/80  Pulse 80  Ht 5\' 5"  (1.651 m)  Wt 157 lb 14.4 oz (71.623 kg)  BMI 26.28 kg/m2 ENT normal.  Neck supple. No adenopathy or thyromegaly. PERLA. Lungs are clear, good air entry, no wheezes, rhonchi or rales. S1 and S2 normal, no murmurs, regular rate and rhythm. Abdomen soft without tenderness, guarding, mass or organomegaly. Extremities show no edema, normal peripheral pulses. Neurological is normal, no focal findings.  PELVIC EXAM: normal external genitalia, vulva, vagina, cervix, uterus and adnexa, PAP: Pap smear done today, WET MOUNT done - results: negative for pathogens, normal epithelial cells, exam chaperoned by Richardine Service discharge present.  ASSESSMENT:  well woman  PLAN:  pap smear additional lab tests per orders return annually or prn Refer  back to rheumatology for SLE

## 2012-10-01 ENCOUNTER — Telehealth: Payer: Self-pay | Admitting: Family Medicine

## 2012-10-01 NOTE — Telephone Encounter (Signed)
Patient is calling for the results of her blood work. °

## 2012-10-01 NOTE — Telephone Encounter (Signed)
Called pt. Went over test results with her. Hemoglobin 11.5, little better (normal 12.0), also informed of pap smear within normal limits.  Pt said, she was worried and would have appreciate a call from [place].  Fwd. To PCP for info. Lorenda Hatchet, Renato Battles

## 2012-10-06 ENCOUNTER — Other Ambulatory Visit: Payer: Self-pay | Admitting: Rheumatology

## 2012-10-06 ENCOUNTER — Ambulatory Visit
Admission: RE | Admit: 2012-10-06 | Discharge: 2012-10-06 | Disposition: A | Discharge status: Home / Self Care | Payer: BC Managed Care – PPO | Source: Ambulatory Visit | Attending: Rheumatology | Admitting: Rheumatology

## 2012-10-06 ENCOUNTER — Ambulatory Visit (INDEPENDENT_AMBULATORY_CARE_PROVIDER_SITE_OTHER): Payer: BC Managed Care – PPO | Admitting: *Deleted

## 2012-10-06 DIAGNOSIS — M8700 Idiopathic aseptic necrosis of unspecified bone: Secondary | ICD-10-CM

## 2012-10-06 DIAGNOSIS — Z309 Encounter for contraceptive management, unspecified: Secondary | ICD-10-CM

## 2012-10-06 DIAGNOSIS — A833 St Louis encephalitis: Secondary | ICD-10-CM

## 2012-10-06 MED ORDER — MEDROXYPROGESTERONE ACETATE 150 MG/ML IM SUSP
150.0000 mg | Freq: Once | INTRAMUSCULAR | Status: AC
  Administered 2012-10-06: 150 mg via INTRAMUSCULAR

## 2012-10-06 NOTE — Progress Notes (Signed)
Patient here today for Depo Provera injection.  Depo given today.  Next injection due July 1-15, 2014.  Richardson, Jeannette Ann, RN    

## 2012-10-10 ENCOUNTER — Emergency Department (HOSPITAL_COMMUNITY)
Admission: EM | Admit: 2012-10-10 | Discharge: 2012-10-10 | Disposition: A | Discharge status: Home / Self Care | Payer: BC Managed Care – PPO | Attending: Emergency Medicine | Admitting: Emergency Medicine

## 2012-10-10 ENCOUNTER — Encounter (HOSPITAL_COMMUNITY): Payer: Self-pay

## 2012-10-10 DIAGNOSIS — L723 Sebaceous cyst: Secondary | ICD-10-CM | POA: Insufficient documentation

## 2012-10-10 DIAGNOSIS — Z8739 Personal history of other diseases of the musculoskeletal system and connective tissue: Secondary | ICD-10-CM | POA: Insufficient documentation

## 2012-10-10 DIAGNOSIS — L089 Local infection of the skin and subcutaneous tissue, unspecified: Secondary | ICD-10-CM

## 2012-10-10 DIAGNOSIS — Y838 Other surgical procedures as the cause of abnormal reaction of the patient, or of later complication, without mention of misadventure at the time of the procedure: Secondary | ICD-10-CM | POA: Insufficient documentation

## 2012-10-10 DIAGNOSIS — Z79899 Other long term (current) drug therapy: Secondary | ICD-10-CM | POA: Insufficient documentation

## 2012-10-10 DIAGNOSIS — I10 Essential (primary) hypertension: Secondary | ICD-10-CM | POA: Insufficient documentation

## 2012-10-10 DIAGNOSIS — Z862 Personal history of diseases of the blood and blood-forming organs and certain disorders involving the immune mechanism: Secondary | ICD-10-CM | POA: Insufficient documentation

## 2012-10-10 MED ORDER — HYDROCODONE-ACETAMINOPHEN 5-325 MG PO TABS: 1.0000 | ORAL_TABLET | ORAL | Status: DC | PRN

## 2012-10-10 NOTE — ED Provider Notes (Signed)
Medical screening examination/treatment/procedure(s) were performed by non-physician practitioner and as supervising physician I was immediately available for consultation/collaboration.  Doug Sou, MD 10/10/12 Rickey Primus

## 2012-10-10 NOTE — ED Provider Notes (Signed)
History  This chart was scribed for non-physician practitioner Elpidio Anis, PA-C working with Doug Sou, MD, by Candelaria Stagers, ED Scribe. This patient was seen in room WTR6/WTR6 and the patient's care was started at 5:05 PM   CSN: [id number]  Arrival date & time 10/10/12  1622   First MD Initiated Contact with Patient 10/10/12 1626      Chief Complaint  Patient presents with  . Wound Infection   The history is provided by the patient. No language interpreter was used.   Angela Walls is a 32 y.o. female who presents to the Emergency Department complaining of a painful cyst to the center of her chest that she noticed three months ago which began to drain earlier today.  She denies nausea, vomiting, diarrhea, or fever.  Nothing seems to make the sx better or worse.    Past Medical History  Diagnosis Date  . Lupus     Followed by Rheum. On chronic Prednisone.  . Hypertension   . Anemia   . Avascular necrosis of femoral head     Bilateral. On chronic narcotics for this.    Past Surgical History  Procedure Laterality Date  . Appendectomy  2001  . Cesarean section  2010    Family History  Problem Relation Age of Onset  . Hypertension Maternal Grandmother   . Hypertension Paternal Grandmother   . Stroke Neg Hx   . Heart disease Neg Hx   . Cancer Neg Hx     History  Substance Use Topics  . Smoking status: Never Smoker   . Smokeless tobacco: Never Used  . Alcohol Use: No    OB History   Grav Para Term Preterm Abortions TAB SAB Ect Mult Living                  Review of Systems  Constitutional: Negative for fever.  Gastrointestinal: Negative for nausea, vomiting and diarrhea.  Skin: Positive for wound (cyst to center of chest).  All other systems reviewed and are negative.    Allergies  Penicillins  Home Medications   Current Outpatient Rx  Name  Route  Sig  Dispense  Refill  . hydrochlorothiazide (HYDRODIURIL) 25 MG tablet   Oral   Take 1  tablet (25 mg total) by mouth daily.   30 tablet   6   . HYDROcodone-acetaminophen (NORCO/VICODIN) 5-325 MG per tablet   Oral   Take 1 tablet by mouth every 6 (six) hours as needed for pain.         . hydroxychloroquine (PLAQUENIL) 200 MG tablet   Oral   Take 1 tablet (200 mg total) by mouth daily.   30 tablet   1   . ibuprofen (ADVIL,MOTRIN) 200 MG tablet   Oral   Take 400 mg by mouth every 6 (six) hours as needed for pain.         . predniSONE (DELTASONE) 5 MG tablet   Oral   Take 5 mg by mouth daily.           BP 143/100  Pulse 72  Temp(Src) 99.1 F (37.3 C) (Oral)  Resp 16  Ht 5\' 5"  (1.651 m)  Wt 165 lb (74.844 kg)  BMI 27.46 kg/m2  SpO2 100%  Physical Exam  Nursing note and vitals reviewed. Constitutional: She is oriented to person, place, and time. She appears well-developed and well-nourished. No distress.  HENT:  Head: Normocephalic and atraumatic.  Eyes: EOM are normal.  Neck: Neck  supple. No tracheal deviation present.  Cardiovascular: Normal rate.   Pulmonary/Chest: Effort normal. No respiratory distress.  Musculoskeletal: Normal range of motion.  Neurological: She is alert and oriented to person, place, and time.  Skin: Skin is warm and dry.  Approximately 3 cm in diameter reddened area just left of the sternal border.  Small central area of purulent drainage.   Psychiatric: She has a normal mood and affect. Her behavior is normal.    ED Course  Procedures   DIAGNOSTIC STUDIES: Oxygen Saturation is 100% on room air, normal by my interpretation.    COORDINATION OF CARE:  5:08 PM Discussed course of care with pt which includes I & D.  Pt understands anda grees.   INCISION AND DRAINAGE PROCEDURE NOTE: Patient identification was confirmed and verbal consent was obtained. This procedure was performed by Elpidio Anis, PA-C at 5:21 PM. Site: just left of sternal border Sterile procedures observed Needle size: 25G Anesthetic used (type and  amt): 1% plain Lidocaine - 2cc  Blade size: #10 Drainage: Large - mixed purulent and sebaceous material Complexity: Complex Site anesthetized, incision made over site, wound drained and explored loculations, rinsed with copious amounts of normal saline, covered with dry, sterile dressing.  Pt tolerated procedure well without complications.  Instructions for care discussed verbally and pt provided with additional written instructions for homecare and f/u.  Labs Reviewed - No data to display No results found.   No diagnosis found. 1. Sebaceous cyst with secondary infection   MDM  I&D of infected sebaceous cyst - patient tolerated procedure well.   I personally performed the services described in this documentation, which was scribed in my presence. The recorded information has been reviewed and is accurate.        Arnoldo Hooker, PA-C 10/10/12 1738

## 2012-10-10 NOTE — ED Notes (Signed)
Pt presents with cyst to center of chest for 3 months. Pt of Family Practice.  Waiting to See [facility] for appt for the cyst.  Pt here because "it popped" today at work"  Pt presents with gauze to center of chest. Pus present oozing from wound site.  Pt c/o of pain from this site. Denies N/V/D and fever

## 2012-10-13 ENCOUNTER — Telehealth: Payer: Self-pay | Admitting: Family Medicine

## 2012-10-13 NOTE — Telephone Encounter (Signed)
Left message for patient to return call. Please tell her she needs to follow up with the surgeon who done the procedure. Dr Ashley Royalty said he will see her but is just going to refer her back to the surgeon.[name], [name]

## 2012-10-13 NOTE — Telephone Encounter (Signed)
Patient called to schedule her f/u appt for her sebaceous cyst.  She said that Dr. Ashley Royalty drained it for her in the hospital, but Dr. Ashley Royalty said that he didn't.  She said that it was done in the hospital and that she was told to f/u with her PCP which is different than what Dr. Ashley Royalty says.  I am not sure where to send her as she says that she didn't have a surgeon as Dr. Ashley Royalty said she had so I let her know that I would ask Dr. Ashley Royalty to call her back to help her sort this out.

## 2012-10-19 NOTE — Telephone Encounter (Signed)
Patient never returned call.Angela Walls  

## 2012-10-20 ENCOUNTER — Other Ambulatory Visit: Payer: Self-pay | Admitting: Family Medicine

## 2012-10-20 MED ORDER — HYDROCODONE-ACETAMINOPHEN 10-325 MG PO TABS: 1.0000 | ORAL_TABLET | Freq: Four times a day (QID) | ORAL | Status: DC | PRN

## 2012-10-21 ENCOUNTER — Ambulatory Visit (INDEPENDENT_AMBULATORY_CARE_PROVIDER_SITE_OTHER): Payer: BC Managed Care – PPO | Admitting: *Deleted

## 2012-10-21 DIAGNOSIS — Z111 Encounter for screening for respiratory tuberculosis: Secondary | ICD-10-CM

## 2012-10-21 NOTE — Progress Notes (Signed)
Tuberculin skin test applied to left ventral forearm. she will come into office in 2 days for reading of test - instructed to make appointment at front desk.Addison Whidbee, Harold Hedge, RN .

## 2012-10-23 ENCOUNTER — Ambulatory Visit (INDEPENDENT_AMBULATORY_CARE_PROVIDER_SITE_OTHER): Payer: BC Managed Care – PPO | Admitting: *Deleted

## 2012-10-23 DIAGNOSIS — Z111 Encounter for screening for respiratory tuberculosis: Secondary | ICD-10-CM

## 2012-10-23 LAB — TB SKIN TEST
Induration: 0 mm
TB Skin Test: NEGATIVE

## 2012-10-23 NOTE — Progress Notes (Signed)
PPD Reading Note PPD read and results entered in [employer]Care. Result:0 mm induration. Interpretation: negative Tenita Cue, Harold Hedge, RN

## 2013-01-14 ENCOUNTER — Telehealth: Payer: Self-pay | Admitting: Family Medicine

## 2013-01-14 NOTE — Telephone Encounter (Signed)
Will fwd to PCP for review.  Nirvana Blanchett L, CMA  

## 2013-01-14 NOTE — Telephone Encounter (Signed)
Pt is calling for a refill on Vicodin. JW

## 2013-01-15 ENCOUNTER — Telehealth: Payer: Self-pay | Admitting: Family Medicine

## 2013-01-15 NOTE — Telephone Encounter (Signed)
Pt's phone number is disconnected.  Called in [place], per MD, see note below to [facility] outpatient pharmacy.  Aizik Reh, Darlyne Russian, CMA

## 2013-01-15 NOTE — Telephone Encounter (Signed)
Please let patient know she needs to schedule an appointment to be seen for her chronic pain and meet new PCP in order to get a refill on her vicodin. Latrelle Dodrill, MD

## 2013-01-15 NOTE — Telephone Encounter (Signed)
LMOVM, see PCP note below.  Pt needs OV.  Eugina Row, Darlyne Russian, CMA

## 2013-01-15 NOTE — Telephone Encounter (Signed)
[facility] red team - can you call in a refill on this patient's norco? I authorize 16 tablets with no refills. Latrelle Dodrill, MD

## 2013-01-15 NOTE — Telephone Encounter (Signed)
Patient is scheduled to be seen on Monday 7/28 and is asking that her her refill on Vicodin be sent in so that she won't have to go through the weekend with out it.  She did not realize that Dr. Ashley Royalty had graduated and that she had a new doctor that didn't know her or she would have scheduled an appt.

## 2013-01-18 ENCOUNTER — Ambulatory Visit: Payer: BC Managed Care – PPO

## 2013-01-18 ENCOUNTER — Encounter: Payer: Self-pay | Admitting: Family Medicine

## 2013-01-18 ENCOUNTER — Ambulatory Visit (INDEPENDENT_AMBULATORY_CARE_PROVIDER_SITE_OTHER): Payer: Self-pay | Admitting: Family Medicine

## 2013-01-18 VITALS — BP 122/72 | HR 76 | Ht 65.0 in | Wt 159.9 lb

## 2013-01-18 DIAGNOSIS — G8929 Other chronic pain: Secondary | ICD-10-CM

## 2013-01-18 DIAGNOSIS — Z309 Encounter for contraceptive management, unspecified: Secondary | ICD-10-CM

## 2013-01-18 DIAGNOSIS — IMO0001 Reserved for inherently not codable concepts without codable children: Secondary | ICD-10-CM

## 2013-01-18 DIAGNOSIS — M25559 Pain in unspecified hip: Secondary | ICD-10-CM

## 2013-01-18 DIAGNOSIS — I10 Essential (primary) hypertension: Secondary | ICD-10-CM

## 2013-01-18 DIAGNOSIS — M25551 Pain in right hip: Secondary | ICD-10-CM

## 2013-01-18 LAB — POCT URINE PREGNANCY: Preg Test, Ur: NEGATIVE

## 2013-01-18 MED ORDER — HYDROCODONE-ACETAMINOPHEN 10-325 MG PO TABS: 1.0000 | ORAL_TABLET | Freq: Four times a day (QID) | ORAL | Status: DC | PRN

## 2013-01-18 MED ORDER — MEDROXYPROGESTERONE ACETATE 150 MG/ML IM SUSP
150.0000 mg | Freq: Once | INTRAMUSCULAR | Status: AC
  Administered 2013-01-18: 150 mg via INTRAMUSCULAR

## 2013-01-18 NOTE — Progress Notes (Signed)
Patient ID: Angela Walls, female   DOB: 11-Aug-1980, 32 y.o.   MRN: [id number]  HPI:  Chronic Pain Follow-Up Assessment Chronic Pain Diagnosis: bilateral avascular necrosis of hips. R worse than L. Currently taking norco once during the day, and 1-2 tabs at night as needed. 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 + R hip pain.  PMFSH: Hx of SLE. Not currently on plaquenil or prednisone. Needs to follow up soon. Goes to Rheumatology office which is located "across the street" - can't remember the name of her doctor.  PHYSICAL EXAM: BP 122/72  Pulse 76  Ht 5\' 5"  (1.651 m)  Wt 159 lb 14.4 oz (72.53 kg)  BMI 26.61 kg/m2 Gen: NAD Heart: RRR. Early 2/6 systolic murmur loudest at LUSB which per pt is not new Lungs: CTAB Neuro: grossly nonfocal, speech intact. Normal ambulation. MSK: Hip is nontender to palpation. Pain with adduction of R hip.

## 2013-01-18 NOTE — Assessment & Plan Note (Signed)
Well controlled per report and on exam today. Continue current regimen.

## 2013-01-18 NOTE — Patient Instructions (Addendum)
It was nice to meet you today!  I refilled 2 months worth of your Norco. I am referring you to physical therapy and orthopedic surgery so they can evaluate your hip.  Come back in 2 months to follow up on your chronic pain. I also recommend you keep following up with the rheumatologist, and have all of their records sent to [place] so I can stay up to date.  Be well, [name]

## 2013-01-18 NOTE — Assessment & Plan Note (Addendum)
Refilled norco for 2 months worth today. Pt reports pain is relatively well controlled with current regimen, but is markedly exacerbated by standing, which she has to do all day for her job. Patient signed chronic pain contract today, will scan into chart. Discussed our clinic's policy on refills, drug testing, one provider/one pharmacy. Pt stated understanding of these policies.  Given that she has bilateral AVN, discussed with her today the option of referral to ortho for further evaluation and management, also discussed getting her in for physical therapy. She is agreeable for both of these options. Referral placed. Precepted with Dr. Randolm Idol.  Follow up in 2 months.

## 2013-02-01 ENCOUNTER — Ambulatory Visit: Payer: Self-pay

## 2013-02-10 ENCOUNTER — Ambulatory Visit: Payer: Self-pay

## 2013-02-15 ENCOUNTER — Telehealth: Payer: Self-pay | Admitting: Family Medicine

## 2013-02-15 NOTE — Telephone Encounter (Signed)
Laced in [name] box Wyatt Haste, RN-BSN

## 2013-02-15 NOTE — Telephone Encounter (Signed)
Pt dropped off paperwork to be filled out concerning disability insurance.

## 2013-02-17 NOTE — Telephone Encounter (Signed)
Please inform patient that I cannot fill out this paperwork for her disability insurance, as when I last saw her she was still able to work. She will need an office visit to be seen for this. I will keep the paperwork in my inbox in the meantime.  Latrelle Dodrill, MD

## 2013-02-17 NOTE — Telephone Encounter (Signed)
VM left for pt to call and make appointment to fill out disability paperwork Wyatt Haste, RN-BSN

## 2013-02-23 NOTE — Telephone Encounter (Signed)
Pt called extremely irrate that she needs appointment for paperwork. " Nobody ever asked me if I could work. I am in extreme pain. How does anyone know if I can work or not - I just need to switch doctors. Y'alls communication sucks" - Pt agreed to come in for appointment as she would not be able to get paperwork completed until she did. Wyatt Haste, RN-BSN

## 2013-03-04 ENCOUNTER — Ambulatory Visit (INDEPENDENT_AMBULATORY_CARE_PROVIDER_SITE_OTHER): Payer: Self-pay | Admitting: Family Medicine

## 2013-03-04 ENCOUNTER — Encounter: Payer: Self-pay | Admitting: Family Medicine

## 2013-03-04 VITALS — BP 143/82 | HR 75 | Temp 98.2°F | Ht 65.0 in | Wt 159.0 lb

## 2013-03-04 DIAGNOSIS — M329 Systemic lupus erythematosus, unspecified: Secondary | ICD-10-CM

## 2013-03-04 DIAGNOSIS — R809 Proteinuria, unspecified: Secondary | ICD-10-CM

## 2013-03-04 DIAGNOSIS — M25559 Pain in unspecified hip: Secondary | ICD-10-CM

## 2013-03-04 DIAGNOSIS — I10 Essential (primary) hypertension: Secondary | ICD-10-CM

## 2013-03-04 DIAGNOSIS — G8929 Other chronic pain: Secondary | ICD-10-CM

## 2013-03-04 NOTE — Patient Instructions (Addendum)
I filled out your insurance form. Check your blood pressure at home a few times a week and write down the numbers. Bring them with you to your next visit in 1 month to recheck the BP.
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
## 2013-03-05 NOTE — Assessment & Plan Note (Signed)
BP elevated today and on recheck. Will have pt f/u in 1 month for BP recheck, if still elevated at that time may require additional agent.

## 2013-03-05 NOTE — Assessment & Plan Note (Signed)
Patient has not been able to see ortho due to having no insurance, she is now also out of work due to her pain. I did complete her disability insurance paperwork for her. I have made copies and will scan these into the charts. She is working on getting the [employer], which will hopefully help her to afford medical bills more easily, at least to the point which we can get her in for an orthopedic evaluation. Did not refill pain medicine today as she stated she did not need refills.

## 2013-03-12 ENCOUNTER — Telehealth: Payer: Self-pay | Admitting: Family Medicine

## 2013-03-12 NOTE — Telephone Encounter (Signed)
Pt is concerned about her lack of appetite. Wants to know what can be done Please advise

## 2013-03-12 NOTE — Telephone Encounter (Signed)
Please have pt sched OV.

## 2013-03-16 ENCOUNTER — Ambulatory Visit: Payer: Self-pay

## 2013-03-16 ENCOUNTER — Telehealth: Payer: Self-pay | Admitting: Family Medicine

## 2013-03-16 NOTE — Telephone Encounter (Signed)
Pt is calling for a refill of her hydrocodone be sent to the pharmacy. She said that the pharmacy faxed [place] on 9/12 and we have not responded. JW

## 2013-03-17 MED ORDER — HYDROCODONE-ACETAMINOPHEN 10-325 MG PO TABS: 1.0000 | ORAL_TABLET | Freq: Four times a day (QID) | ORAL | Status: DC | PRN

## 2013-03-17 NOTE — Telephone Encounter (Signed)
Called patient to clarify on timing of her medicine, as she should have had it refilled on August 28. She states she is not out yet but will be out tomorrow. This is appropriate. Will fax in refills for 2 months worth of her norco. Also let pt know that I have not received any requests from her pharmacy or this would have been addressed sooner. Reiterated importance of follow up on BP; pt has appointment scheduled. Pt appreciative of call.

## 2013-03-17 NOTE — Telephone Encounter (Signed)
Will fwd as high priority to PCP.  Bryson Gavia, Darlyne Russian, CMA

## 2013-03-17 NOTE — Telephone Encounter (Signed)
Patient calls again inquiring about rx request.

## 2013-03-18 ENCOUNTER — Telehealth: Payer: Self-pay | Admitting: Family Medicine

## 2013-03-18 NOTE — Telephone Encounter (Signed)
Pt uses the outpatient pharmacy not [facility]. Notes show the rx was sent to [facility] Please resend

## 2013-03-18 NOTE — Telephone Encounter (Signed)
Called pt and advised to have her medication transferred to the pharmacy of her choice. i changed the pharmacy in her chart for her next visit, because she reports, that it happens every time (wrong pharmacy).Angela Walls

## 2013-03-24 ENCOUNTER — Ambulatory Visit (INDEPENDENT_AMBULATORY_CARE_PROVIDER_SITE_OTHER): Payer: No Typology Code available for payment source | Admitting: Family Medicine

## 2013-03-24 ENCOUNTER — Encounter: Payer: Self-pay | Admitting: Family Medicine

## 2013-03-24 VITALS — BP 130/86 | HR 76 | Temp 98.6°F | Ht 65.0 in | Wt 156.8 lb

## 2013-03-24 DIAGNOSIS — R630 Anorexia: Secondary | ICD-10-CM

## 2013-03-24 NOTE — Patient Instructions (Signed)
It was great to see you again today!  I recommend you set up an appointment with Dr. Gerilyn Pilgrim, our nutritionist here at the [facility]. I think she'll be helpful in figuring out your concerns about your appetite.  I am checking your thyroid function today. I will call you if your test results are not normal.  Otherwise, I will send you a letter.  If you do not hear from me with in 2 weeks please call our office.      Follow up in 2 months for regular problems.  Be well, [name]

## 2013-03-24 NOTE — Progress Notes (Signed)
Patient ID: Angela Walls, female   DOB: December 29, 1980, 32 y.o.   MRN: [id number]  HPI:  Decreased appetite: Patient presents today concerned about decreased appetite. She notes this has happened since April or May of this year. She previously spoke to her PCP Dr. Ashley Royalty about this, however states nothing was done. She denies having any other symptoms other than feeling tired and constantly weak. She just has a decreased desire to eat. Denies abdominal pain, nausea or vomiting, or problems stooling. States she is drinking plenty. Typically a breakfast for her consistent headaches, bacon, toast, and a biscuit. Lunch usually consists of chicken and rice. She states that she has had decreased intake and is now eating less than normal. Denies any history of thyroid problems. States she sometimes misses dinner. The decrease appetite is very troubling to her. Of note she previously took prednisone chronically but tapered herself off of it within the last year. She does think she can tell that her weight is fluctuating by how her clothes fit.  ROS: See HPI  PMFSH: hx AVN of bilateral hips, on chronic narcotics  PHYSICAL EXAM: BP 130/86  Pulse 76  Temp(Src) 98.6 F (37 C) (Oral)  Ht 5\' 5"  (1.651 m)  Wt 156 lb 12.8 oz (71.124 kg)  BMI 26.09 kg/m2 Gen: No acute distress, pleasant and cooperative Heart: Regular rate and rhythm, no murmurs. Lungs: Clear.rotation bilaterally, normal work of breathing. Abdomen: Soft, nontender, normoactive bowel sounds, no appreciable masses. Neuro: Grossly nonfocal, speech intact.  ASSESSMENT/PLAN:  # Decreased appetite: -Weight flowsheets reviewed, weight has been stable over the last year. She is actually the exact same weight that she was this time last year. -Has not had recent TSH we'll go ahead and check this today -PHQ-9 performed today to screen for depression. The patient scored 6 suggesting minimal (if any) depression. Doubt this is contributing to her  decreased appetite. -Suspect this is likely psychological/worried well given that her weight is stable and she is well-appearing. -I have given her the contact information for Dr. Gerilyn Pilgrim, our clinic's nutritionist. I think meeting with Dr. Gerilyn Pilgrim will be helpful for her in elucidating her concerns about her appetite.  FOLLOW UP: F/u in 2 months for chronic medical problems

## 2013-03-25 LAB — TSH: TSH: 0.265 u[IU]/mL — ABNORMAL LOW (ref 0.350–4.500)

## 2013-03-26 ENCOUNTER — Telehealth: Payer: Self-pay | Admitting: Family Medicine

## 2013-03-26 DIAGNOSIS — R7989 Other specified abnormal findings of blood chemistry: Secondary | ICD-10-CM

## 2013-03-26 NOTE — Telephone Encounter (Signed)
Returned pt's call. I have not yet received her paperwork. She will have the insurance company mail it to [place], as they have apparently tried to fax it several times.  Also discussed low TSH result with patient. Advised she return for another lab visit to recheck TSH, also to check T3 & free T4. She will call Monday to schedule this lab appointment.  Angela Dodrill, MD

## 2013-03-26 NOTE — Telephone Encounter (Signed)
Patient calling to see if [name] has received paperwork for her per fax.?

## 2013-03-29 NOTE — Telephone Encounter (Signed)
Paperwork left with [name] to complete .

## 2013-04-01 NOTE — Telephone Encounter (Signed)
Paperwork completed, will return to [employer]. Latrelle Dodrill, MD

## 2013-04-16 ENCOUNTER — Telehealth: Payer: Self-pay | Admitting: Family Medicine

## 2013-04-16 NOTE — Telephone Encounter (Signed)
Patient dropped off disability forms to be filled out.  Please call her when completed.

## 2013-04-19 NOTE — Telephone Encounter (Signed)
I do not see any disability forms in my box. Is that where they are located? Latrelle Dodrill, MD

## 2013-04-22 NOTE — Telephone Encounter (Signed)
Copy made of form and placed in scan box.  Called and informed patient form ready to pick up at front desk.  Gaylene Brooks, RN

## 2013-04-22 NOTE — Telephone Encounter (Signed)
Form completed, will return to [employer]. Needs to be scanned into chart, then please call patient to let her know it is ready. Thanks. Latrelle Dodrill, MD

## 2013-05-11 ENCOUNTER — Ambulatory Visit: Payer: No Typology Code available for payment source

## 2013-05-17 ENCOUNTER — Other Ambulatory Visit (INDEPENDENT_AMBULATORY_CARE_PROVIDER_SITE_OTHER): Payer: No Typology Code available for payment source

## 2013-05-17 DIAGNOSIS — R6889 Other general symptoms and signs: Secondary | ICD-10-CM

## 2013-05-17 DIAGNOSIS — R7989 Other specified abnormal findings of blood chemistry: Secondary | ICD-10-CM

## 2013-05-17 NOTE — Progress Notes (Signed)
TSH,FT-3 AND FT-4 DONE TODAY Angela Walls 

## 2013-05-18 ENCOUNTER — Telehealth: Payer: Self-pay | Admitting: Family Medicine

## 2013-05-18 ENCOUNTER — Encounter: Payer: Self-pay | Admitting: Family Medicine

## 2013-05-18 LAB — T3, FREE: T3, Free: 3.2 pg/mL (ref 2.3–4.2)

## 2013-05-18 LAB — T4, FREE: Free T4: 1.09 ng/dL (ref 0.80–1.80)

## 2013-05-18 LAB — TSH: TSH: 0.577 u[IU]/mL (ref 0.350–4.500)

## 2013-05-18 NOTE — Telephone Encounter (Signed)
Will fwd to Md.  Osric Klopf L, CMA  

## 2013-05-18 NOTE — Telephone Encounter (Signed)
Pt called her for her pain medication. She was told she would have to pick up the prescription Please advise

## 2013-05-24 ENCOUNTER — Ambulatory Visit: Payer: No Typology Code available for payment source

## 2013-05-24 MED ORDER — HYDROCODONE-ACETAMINOPHEN 10-325 MG PO TABS: 1.0000 | ORAL_TABLET | Freq: Four times a day (QID) | ORAL | Status: DC | PRN

## 2013-05-24 NOTE — Telephone Encounter (Signed)
Patient is wondering why she has not been called concerning her prescription.  She is needing it asap.

## 2013-05-24 NOTE — Telephone Encounter (Signed)
Pt notified.  Kylie Gros L, CMA  

## 2013-05-24 NOTE — Telephone Encounter (Signed)
Rx done, will place at front desk for pick up. Please inform patient that she must schedule an appointment to follow up on her pain before I will give her any further refills, she is due for an appointment. Thanks,  Latrelle Dodrill, MD

## 2013-06-04 ENCOUNTER — Telehealth: Payer: Self-pay | Admitting: Family Medicine

## 2013-06-04 NOTE — Telephone Encounter (Signed)
Pt called and would like someone to call her. She isn't sure if she has a UTI, or bladder infection and would like to know what she can take OTC. jw

## 2013-06-04 NOTE — Telephone Encounter (Signed)
Spoke with patient and she c/o urinary urgency.  No pain, no fever, no vaginal discharge.  Pt is going to take OTC Uristat.  I told pt she should be seen if this continues, either at the Urgent Care or call and make an appt with [place] for Monday.  Sloan Takagi, Darlyne Russian, CMA

## 2013-06-08 ENCOUNTER — Ambulatory Visit: Payer: No Typology Code available for payment source | Admitting: Family Medicine

## 2013-06-10 ENCOUNTER — Encounter (HOSPITAL_COMMUNITY): Payer: Self-pay | Admitting: *Deleted

## 2013-06-10 ENCOUNTER — Inpatient Hospital Stay (HOSPITAL_COMMUNITY)
Admission: AD | Admit: 2013-06-10 | Discharge: 2013-06-10 | Disposition: A | Discharge status: Home / Self Care | Payer: No Typology Code available for payment source | Source: Ambulatory Visit | Attending: Obstetrics & Gynecology | Admitting: Obstetrics & Gynecology

## 2013-06-10 DIAGNOSIS — R350 Frequency of micturition: Secondary | ICD-10-CM | POA: Insufficient documentation

## 2013-06-10 DIAGNOSIS — N760 Acute vaginitis: Secondary | ICD-10-CM

## 2013-06-10 DIAGNOSIS — B9689 Other specified bacterial agents as the cause of diseases classified elsewhere: Secondary | ICD-10-CM | POA: Insufficient documentation

## 2013-06-10 DIAGNOSIS — A499 Bacterial infection, unspecified: Secondary | ICD-10-CM

## 2013-06-10 DIAGNOSIS — R109 Unspecified abdominal pain: Secondary | ICD-10-CM | POA: Insufficient documentation

## 2013-06-10 LAB — WET PREP, GENITAL
Trich, Wet Prep: NONE SEEN
Yeast Wet Prep HPF POC: NONE SEEN

## 2013-06-10 LAB — URINALYSIS, ROUTINE W REFLEX MICROSCOPIC
Bilirubin Urine: NEGATIVE
Glucose, UA: NEGATIVE mg/dL
Hgb urine dipstick: NEGATIVE
Ketones, ur: NEGATIVE mg/dL
Leukocytes, UA: NEGATIVE
Nitrite: NEGATIVE
Protein, ur: NEGATIVE mg/dL
Specific Gravity, Urine: >1.03 — ABNORMAL HIGH (ref 1.005–1.030)
Urobilinogen, UA: 0.2 mg/dL (ref 0.0–1.0)
pH: 6 (ref 5.0–8.0)

## 2013-06-10 LAB — POCT PREGNANCY, URINE: Preg Test, Ur: NEGATIVE

## 2013-06-10 MED ORDER — METRONIDAZOLE 500 MG PO TABS: 500.0000 mg | ORAL_TABLET | Freq: Two times a day (BID) | ORAL | Status: AC

## 2013-06-10 NOTE — MAU Provider Note (Signed)
CC: Abdominal Pain    First Provider Initiated Contact with Patient 06/10/13 1827      HPI       Angela Walls is a 32 y.o. [id number] who presents with onset a few days ago of urinary frequency and intermittent crampy lower abdominal discomfort. No abdominal pain now. Nocturia x1 per night. Drinks lots of sodas. She thinks she may have a UTI. No dysuria, urgency, hematuria, systemic symptoms. No irritative vaginal discharge or new sexual partner.  Amenorrheic on Depo.        Past Medical History  Diagnosis Date  . Lupus     Followed by Rheum. On chronic Prednisone.  . Hypertension   . Anemia   . Avascular necrosis of femoral head     Bilateral. On chronic narcotics for this.    OB History  Gravida Para Term Preterm AB SAB TAB Ectopic Multiple Living  3 2  2 1 1    2     # Outcome Date GA Lbr Len/2nd Weight Sex Delivery Anes PTL Lv  3 PRE 10/22/08    Octaviano Glow   Y  2 SAB 2006          1 PRE 03/25/98    M SVD   Y      Past Surgical History  Procedure Laterality Date  . Appendectomy  2001  . Cesarean section  2010    History   Social History  . Marital Status: Single    Spouse Name: N/A    Number of Children: N/A  . Years of Education: N/A   Occupational History  . 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 Smoking status: Never Smoker   . Smokeless tobacco: Never Used  . Alcohol Use: No  . Drug Use: No  . Sexual Activity: Yes    Birth Control/ Protection: None   Other Topics Concern  . Not on file   Social History Narrative   Lives with 31 year old and 31 year old.  In a relationship with the 38 year old's father.   Bachelor's in [employer].  Works at [employer] now.     No current facility-administered medications on file prior to encounter.   No current outpatient prescriptions on file prior to encounter.    Allergies  Allergen Reactions  . Penicillins Hives and Swelling    ROS Pertinent items in  HPI  PHYSICAL EXAM Filed Vitals:   06/10/13 1738  BP: 138/90  Pulse: 70  Temp: 98.4 F (36.9 C)  Resp: 18   General: Well nourished, well developed female in no acute distress Cardiovascular: Normal rate Respiratory: Normal effort Abdomen: Soft, nontender Back: No CVAT Extremities: No edema Neurologic: Alert and oriented Speculum exam: NEFG; vagina with watery white discharge, no blood; cervix clean Bimanual exam: cervix closed, no CMT; uterus NSSP; no adnexal tenderness or masses   LAB RESULTS Results for orders placed during the hospital encounter of 06/10/13 (from the past 24 hour(s))  URINALYSIS, ROUTINE W REFLEX MICROSCOPIC     Status: Abnormal   Collection Time    06/10/13  5:40 PM      Result Value Range   Color, Urine YELLOW  YELLOW   APPearance CLEAR  CLEAR   Specific Gravity, Urine >1.030 (*) 1.005 - 1.030   pH 6.0  5.0 - 8.0   Glucose, UA NEGATIVE  NEGATIVE mg/dL   Hgb urine dipstick NEGATIVE  NEGATIVE   Bilirubin  Urine NEGATIVE  NEGATIVE   Ketones, ur NEGATIVE  NEGATIVE mg/dL   Protein, ur NEGATIVE  NEGATIVE mg/dL   Urobilinogen, UA 0.2  0.0 - 1.0 mg/dL   Nitrite NEGATIVE  NEGATIVE   Leukocytes, UA NEGATIVE  NEGATIVE  POCT PREGNANCY, URINE     Status: None   Collection Time    06/10/13  5:50 PM      Result Value Range   Preg Test, Ur NEGATIVE  NEGATIVE  WET PREP, GENITAL     Status: Abnormal   Collection Time    06/10/13  6:39 PM      Result Value Range   Yeast Wet Prep HPF POC NONE SEEN  NONE SEEN   Trich, Wet Prep NONE SEEN  NONE SEEN   Clue Cells Wet Prep HPF POC MODERATE (*) NONE SEEN   WBC, Wet Prep HPF POC FEW (*) NONE SEEN     ASSESSMENT  1. BV (bacterial vaginosis)     PLAN Discharge home. See AVS for patient education. Advised to cut out caffiene    Medication List         cholecalciferol 1000 UNITS tablet  Commonly known as:  VITAMIN D  Take 1,000 Units by mouth daily.     ibuprofen 200 MG tablet  Commonly known as:   ADVIL,MOTRIN  Take 200 mg by mouth every 6 (six) hours as needed for moderate pain.     labetalol 100 MG tablet  Commonly known as:  NORMODYNE  Take 100 mg by mouth 2 (two) times daily.     medroxyPROGESTERone 150 MG/ML injection  Commonly known as:  DEPO-PROVERA  Inject 150 mg into the muscle every 3 (three) months. Pt is due for next injection in January.     metroNIDAZOLE 500 MG tablet  Commonly known as:  FLAGYL  Take 1 tablet (500 mg total) by mouth 2 (two) times daily.           Danae Orleans, CNM 06/10/2013 6:32 PM

## 2013-06-10 NOTE — MAU Note (Signed)
Lower abd pain, started a couple days ago, comes and goes. Thinks might have a UTI.  Denies pain or burning with urination,has frequency.

## 2013-06-11 LAB — GC/CHLAMYDIA PROBE AMP
CT Probe RNA: NEGATIVE
GC Probe RNA: NEGATIVE

## 2013-06-11 NOTE — MAU Provider Note (Signed)

## 2013-06-23 ENCOUNTER — Encounter: Payer: Self-pay | Admitting: Family Medicine

## 2013-06-23 ENCOUNTER — Ambulatory Visit (INDEPENDENT_AMBULATORY_CARE_PROVIDER_SITE_OTHER): Payer: No Typology Code available for payment source | Admitting: Family Medicine

## 2013-06-23 VITALS — BP 137/83 | HR 73 | Temp 98.2°F | Ht 66.0 in | Wt 160.0 lb

## 2013-06-23 DIAGNOSIS — G894 Chronic pain syndrome: Secondary | ICD-10-CM

## 2013-06-23 DIAGNOSIS — I10 Essential (primary) hypertension: Secondary | ICD-10-CM

## 2013-06-23 DIAGNOSIS — M25559 Pain in unspecified hip: Secondary | ICD-10-CM

## 2013-06-23 DIAGNOSIS — G8929 Other chronic pain: Secondary | ICD-10-CM

## 2013-06-23 MED ORDER — HYDROCODONE-ACETAMINOPHEN 10-325 MG PO TABS: 1.0000 | ORAL_TABLET | Freq: Four times a day (QID) | ORAL | Status: DC | PRN

## 2013-06-23 NOTE — Patient Instructions (Signed)
It was great to see you again today!  I refilled your pain medicine for 3 months. I am referring you to orthopedics for your hip. You will get a phone call to schedule this appointment.   Be well, [name]

## 2013-06-26 LAB — DRUG SCR UR, PAIN MGMT, REFLEX CONF
Amphetamine Screen, Ur: NEGATIVE
Barbiturate Quant, Ur: NEGATIVE
Benzodiazepines.: NEGATIVE
Cocaine Metabolites: NEGATIVE
Creatinine,U: 90.59 mg/dL
Marijuana Metabolite: NEGATIVE
Methadone: NEGATIVE
Opiates: NEGATIVE
Phencyclidine (PCP): NEGATIVE
Propoxyphene: NEGATIVE

## 2013-06-26 NOTE — Assessment & Plan Note (Signed)
BP at goal , continue current regimen

## 2013-06-26 NOTE — Assessment & Plan Note (Addendum)
Pain moderately well controlled. Will continue current regimen. Medication refilled x 3 months worth. Check drug screen today for routine monitoring with pain contract. Will also refer again to ortho since she now has more financial resources.

## 2013-06-26 NOTE — Progress Notes (Signed)
Patient ID: Angela Walls, female   DOB: 01/16/1981, 33 y.o.   MRN: [id number]  HPI:  Chronic hip pain: here for f/u of chronic hip pain which is secondary to bilateral avascular necrosis of her femoral heads. She currently takes norco 10-325mg . In the past month has had to take it regularly, at least 2-3 times per day due to the pain she has. Sometimes it helps with the pain and sometimes it doesn't help as much. She does not want to add additional medicines or change the dose. 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 Has not yet been seen by ortho but now has the orange card so hopefully this will be more affordable for her (ortho does not take orange card but she can at least use the money she was spending here to go to ortho). Previously has [facility]tek [facility] but that was over 3 years ago.  HTN: currently taking labetalol 100mg  BID. Denies chest pain, SOB, swelling, or vision changes.  ROS: See HPI  PMFSH: hx chronic avascular necrosis of bilat femoral heads  PHYSICAL EXAM: BP 137/83  Pulse 73  Temp(Src) 98.2 F (36.8 C) (Oral)  Ht 5\' 6"  (1.676 m)  Wt 160 lb (72.576 kg)  BMI 25.84 kg/m2 Gen: NAD HEENT: NCAT Heart: RRR Lungs: CTAB Neuro: grossly nonfocal, speech intact Ext: No appreciable lower extremity edema bilaterally. Back nontender to palpation. Some pain with active resistance with hip flexion. Full strength in bilateral legs with hip flexion and knee flexion/extension.  ASSESSMENT/PLAN:  See problem based charting for additional assessment/plan.   FOLLOW UP: F/u in 3 months for chronic pain & HTN.  GrenadaBrittany J. Pollie MeyerMcIntyre, MD [facility]

## 2013-08-23 ENCOUNTER — Ambulatory Visit: Payer: Self-pay | Admitting: Family Medicine

## 2013-09-13 ENCOUNTER — Ambulatory Visit: Payer: Self-pay | Admitting: Family Medicine

## 2013-09-17 ENCOUNTER — Telehealth: Payer: Self-pay | Admitting: Family Medicine

## 2013-09-17 ENCOUNTER — Encounter (HOSPITAL_COMMUNITY): Payer: Self-pay

## 2013-09-17 ENCOUNTER — Inpatient Hospital Stay (HOSPITAL_COMMUNITY)
Admission: AD | Admit: 2013-09-17 | Discharge: 2013-09-17 | Disposition: A | Discharge status: Home / Self Care | Payer: Self-pay | Source: Ambulatory Visit | Attending: Obstetrics & Gynecology | Admitting: Obstetrics & Gynecology

## 2013-09-17 DIAGNOSIS — R109 Unspecified abdominal pain: Secondary | ICD-10-CM

## 2013-09-17 DIAGNOSIS — N912 Amenorrhea, unspecified: Secondary | ICD-10-CM | POA: Insufficient documentation

## 2013-09-17 DIAGNOSIS — M329 Systemic lupus erythematosus, unspecified: Secondary | ICD-10-CM | POA: Insufficient documentation

## 2013-09-17 DIAGNOSIS — B9689 Other specified bacterial agents as the cause of diseases classified elsewhere: Secondary | ICD-10-CM | POA: Insufficient documentation

## 2013-09-17 DIAGNOSIS — N760 Acute vaginitis: Secondary | ICD-10-CM | POA: Insufficient documentation

## 2013-09-17 DIAGNOSIS — M87059 Idiopathic aseptic necrosis of unspecified femur: Secondary | ICD-10-CM | POA: Insufficient documentation

## 2013-09-17 DIAGNOSIS — IMO0002 Reserved for concepts with insufficient information to code with codable children: Secondary | ICD-10-CM | POA: Insufficient documentation

## 2013-09-17 DIAGNOSIS — A499 Bacterial infection, unspecified: Secondary | ICD-10-CM | POA: Insufficient documentation

## 2013-09-17 LAB — URINALYSIS, ROUTINE W REFLEX MICROSCOPIC
Bilirubin Urine: NEGATIVE
Glucose, UA: NEGATIVE mg/dL
Hgb urine dipstick: NEGATIVE
Ketones, ur: NEGATIVE mg/dL
Leukocytes, UA: NEGATIVE
Nitrite: NEGATIVE
Protein, ur: NEGATIVE mg/dL
Specific Gravity, Urine: 1.025 (ref 1.005–1.030)
Urobilinogen, UA: 1 mg/dL (ref 0.0–1.0)
pH: 6.5 (ref 5.0–8.0)

## 2013-09-17 LAB — WET PREP, GENITAL
Trich, Wet Prep: NONE SEEN
Yeast Wet Prep HPF POC: NONE SEEN

## 2013-09-17 LAB — POCT PREGNANCY, URINE: Preg Test, Ur: NEGATIVE

## 2013-09-17 MED ORDER — IBUPROFEN 800 MG PO TABS: 800.0000 mg | ORAL_TABLET | Freq: Three times a day (TID) | ORAL | Status: DC | PRN

## 2013-09-17 MED ORDER — HYDROCODONE-ACETAMINOPHEN 10-325 MG PO TABS: 1.0000 | ORAL_TABLET | Freq: Four times a day (QID) | ORAL | Status: DC | PRN

## 2013-09-17 MED ORDER — METRONIDAZOLE 500 MG PO TABS: 500.0000 mg | ORAL_TABLET | Freq: Two times a day (BID) | ORAL | Status: DC

## 2013-09-17 NOTE — Telephone Encounter (Signed)
Will fwd to MD for review.  Evanee Lubrano L, CMA  

## 2013-09-17 NOTE — Telephone Encounter (Signed)
I will authorize this refill for one month's worth to last her until her appointment in April. Patient absolutely MUST keep this appointment in April or I will not refill her medications again until she is seen.   She no-showed to her appointment with me earlier this week, and had a same-day cancellation of another appointment earlier this month.  Please inform patient that her rx is ready to pick up, and of the above message.  Latrelle DodrillBrittany J Leighana Neyman, MD

## 2013-09-17 NOTE — Telephone Encounter (Signed)
Pt called and will be out of her pain medication on Tuesday 3/31. She made an appointment but its not until 4/17. Please call when ready. jw

## 2013-09-17 NOTE — MAU Note (Signed)
About a wk ago started have frequent urination without pain. I urinate and feel like i have to go again right away. No pain with urination

## 2013-09-17 NOTE — Discharge Instructions (Signed)
Abdominal Pain, Adult Many things can cause abdominal pain. Usually, abdominal pain is not caused by a disease and will improve without treatment. It can often be observed and treated at home. Your health care provider will do a physical exam and possibly order blood tests and X-rays to help determine the seriousness of your pain. However, in many cases, more time must pass before a clear cause of the pain can be found. Before that point, your health care provider may not know if you need more testing or further treatment. HOME CARE INSTRUCTIONS  Monitor your abdominal pain for any changes. The following actions may help to alleviate any discomfort you are experiencing:  Only take over-the-counter or prescription medicines as directed by your health care provider.  Do not take laxatives unless directed to do so by your health care provider.  Try a clear liquid diet (broth, tea, or water) as directed by your health care provider. Slowly move to a bland diet as tolerated. SEEK MEDICAL CARE IF:  You have unexplained abdominal pain.  You have abdominal pain associated with nausea or diarrhea.  You have pain when you urinate or have a bowel movement.  You experience abdominal pain that wakes you in the night.  You have abdominal pain that is worsened or improved by eating food.  You have abdominal pain that is worsened with eating fatty foods. SEEK IMMEDIATE MEDICAL CARE IF:   Your pain does not go away within 2 hours.  You have a fever.  You keep throwing up (vomiting).  Your pain is felt only in portions of the abdomen, such as the right side or the left lower portion of the abdomen.  You pass bloody or black tarry stools. MAKE SURE YOU:  Understand these instructions.   Will watch your condition.   Will get help right away if you are not doing well or get worse.  Document Released: 03/20/2005 Document Revised: 03/31/2013 Document Reviewed: 02/17/2013 [facility] Patient  Information 2014 [place], [place]. Bacterial Vaginosis Bacterial vaginosis is a vaginal infection that occurs when the normal balance of bacteria in the vagina is disrupted. It results from an overgrowth of certain bacteria. This is the most common vaginal infection in women of childbearing age. Treatment is important to prevent complications, especially in pregnant women, as it can cause a premature delivery. CAUSES  Bacterial vaginosis is caused by an increase in harmful bacteria that are normally present in smaller amounts in the vagina. Several different kinds of bacteria can cause bacterial vaginosis. However, the reason that the condition develops is not fully understood. RISK FACTORS Certain activities or behaviors can put you at an increased risk of developing bacterial vaginosis, including:  Having a new sex partner or multiple sex partners.  Douching.  Using an intrauterine device (IUD) for contraception. Women do not get bacterial vaginosis from toilet seats, bedding, swimming pools, or contact with objects around them. SIGNS AND SYMPTOMS  Some women with bacterial vaginosis have no signs or symptoms. Common symptoms include:  Grey vaginal discharge.  A fishlike odor with discharge, especially after sexual intercourse.  Itching or burning of the vagina and vulva.  Burning or pain with urination. DIAGNOSIS  Your health care provider will take a medical history and examine the vagina for signs of bacterial vaginosis. A sample of vaginal fluid may be taken. Your health care provider will look at this sample under a microscope to check for bacteria and abnormal cells. A vaginal pH test may also be done.  TREATMENT  °Bacterial vaginosis may be treated with antibiotic medicines. These may be given in the form of a pill or a vaginal cream. A second round of antibiotics may be prescribed if the condition comes back after treatment.  °HOME CARE INSTRUCTIONS  °· Only take over-the-counter or  prescription medicines as directed by your health care provider. °· If antibiotic medicine was prescribed, take it as directed. Make sure you finish it even if you start to feel better. °· Do not have sex until treatment is completed. °· Tell all sexual partners that you have a vaginal infection. They should see their health care provider and be treated if they have problems, such as a mild rash or itching. °· Practice safe sex by using condoms and only having one sex partner. °SEEK MEDICAL CARE IF:  °· Your symptoms are not improving after 3 days of treatment. °· You have increased discharge or pain. °· You have a fever. °MAKE SURE YOU:  °· Understand these instructions. °· Will watch your condition. °· Will get help right away if you are not doing well or get worse. °FOR MORE INFORMATION  °Centers for Disease Control and Prevention, Division of STD Prevention: www.cdc.gov/std °American Sexual Health Association (ASHA): www.ashastd.org  °Document Released: 06/10/2005 Document Revised: 03/31/2013 Document Reviewed: 01/20/2013 °ExitCare® Patient Information ©2014 Exit[employer], LLC. ° °

## 2013-09-17 NOTE — MAU Provider Note (Signed)
History     CSN: [id number]  Arrival date and time: 09/17/13 4098   First Provider Initiated Contact with Patient 09/17/13 2051      Chief Complaint  Patient presents with  . Urinary Tract Infection   HPI Comments: Angela Walls 33 y.o. [id number] presents to [facility]U with what feel like UTI. She has not had menses cycle since Oct following discontinuation of Depo Provera. She last had sexual intercourse 1 week ago. She does not have GYN. Same partner for 11 years.  Urinary Tract Infection  Associated symptoms include frequency and urgency.      Past Medical History  Diagnosis Date  . Lupus     Followed by Rheum. On chronic Prednisone.  . Hypertension   . Anemia   . Avascular necrosis of femoral head     Bilateral. On chronic narcotics for this.    Past Surgical History  Procedure Laterality Date  . Appendectomy  2001  . Cesarean section  2010    Family History  Problem Relation Age of Onset  . Hypertension Maternal Grandmother   . Hypertension Paternal Grandmother   . Stroke Neg Hx   . Heart disease Neg Hx   . Cancer Neg Hx   . Hypertension Father     History  Substance Use Topics  . Smoking status: Never Smoker   . Smokeless tobacco: Never Used  . Alcohol Use: No    Allergies:  Allergies  Allergen Reactions  . Penicillins Hives and Swelling    Prescriptions prior to admission  Medication Sig Dispense Refill  . cholecalciferol (VITAMIN D) 1000 UNITS tablet Take 1,000 Units by mouth daily.      Marland Kitchen HYDROcodone-acetaminophen (NORCO) 10-325 MG per tablet Take 1 tablet by mouth every 6 (six) hours as needed for severe pain.  120 tablet  0  . labetalol (NORMODYNE) 100 MG tablet Take 100 mg by mouth 2 (two) times daily.      . Multiple Vitamin (MULTIVITAMIN WITH MINERALS) TABS tablet Take 1 tablet by mouth daily.        Review of Systems  Constitutional: Negative.   HENT: Negative.   Eyes: Negative.   Respiratory: Negative.   Cardiovascular: Negative.    Gastrointestinal: Negative.   Genitourinary: Positive for dysuria, urgency and frequency.  Skin: Negative.   Neurological: Negative.   Psychiatric/Behavioral: Negative.    Physical Exam   Blood pressure 133/86, pulse 68, temperature 98.3 F (36.8 C), resp. rate 20, height 5\' 6"  (1.676 m), weight 157 lb 9.6 oz (71.487 kg).  Physical Exam  Constitutional: She is oriented to person, place, and time. She appears well-developed and well-nourished. No distress.  HENT:  Head: Normocephalic and atraumatic.  GI: Soft. Bowel sounds are normal. She exhibits no distension and no mass. There is no tenderness. There is no rebound and no guarding.  Genitourinary:  Genital:External/ negative Vaginal:moderate amount white discharge Cervix:closed/ no lesion/ no CMT Bimanual:uterus tender   Musculoskeletal: Normal range of motion.  Neurological: She is alert and oriented to person, place, and time.  Skin: Skin is warm and dry.  Psychiatric: She has a normal mood and affect. Her behavior is normal. Judgment and thought content normal.   Results for orders placed during the hospital encounter of 09/17/13 (from the past 24 hour(s))  URINALYSIS, ROUTINE W REFLEX MICROSCOPIC     Status: None   Collection Time    09/17/13  7:55 PM      Result Value Ref Range   Color,  Urine YELLOW  YELLOW   APPearance CLEAR  CLEAR   Specific Gravity, Urine 1.025  1.005 - 1.030   pH 6.5  5.0 - 8.0   Glucose, UA NEGATIVE  NEGATIVE mg/dL   Hgb urine dipstick NEGATIVE  NEGATIVE   Bilirubin Urine NEGATIVE  NEGATIVE   Ketones, ur NEGATIVE  NEGATIVE mg/dL   Protein, ur NEGATIVE  NEGATIVE mg/dL   Urobilinogen, UA 1.0  0.0 - 1.0 mg/dL   Nitrite NEGATIVE  NEGATIVE   Leukocytes, UA NEGATIVE  NEGATIVE  POCT PREGNANCY, URINE     Status: None   Collection Time    09/17/13  8:10 PM      Result Value Ref Range   Preg Test, Ur NEGATIVE  NEGATIVE  WET PREP, GENITAL     Status: Abnormal   Collection Time    09/17/13  9:23 PM       Result Value Ref Range   Yeast Wet Prep HPF POC NONE SEEN  NONE SEEN   Trich, Wet Prep NONE SEEN  NONE SEEN   Clue Cells Wet Prep HPF POC FEW (*) NONE SEEN   WBC, Wet Prep HPF POC MODERATE (*) NONE SEEN    MAU Course  Procedures  MDM Wet prep/ GC/ Chlamydia  Assessment and Plan   A: Abdominal discomfort Bacterial vaginosis  P: Likely starting menses soon Motrin 800 mg PO TID prn pain Flagyl 500 mg PO BID X 7 days No alcohol/ condoms till established on birth control Needs to establish care with GYN for other birth control options  Carolynn ServeBarefoot, Corde Antonini Miller 09/17/2013, 10:06 PM

## 2013-09-18 LAB — GC/CHLAMYDIA PROBE AMP
CT Probe RNA: NEGATIVE
GC Probe RNA: NEGATIVE

## 2013-09-20 NOTE — Telephone Encounter (Signed)
Pt has appt 10/08/13 .Angela Walls, Angela Walls

## 2013-10-08 ENCOUNTER — Ambulatory Visit: Payer: Self-pay | Admitting: Family Medicine

## 2013-10-18 NOTE — Telephone Encounter (Signed)
Pt wants dr Pollie Meyermcintyre she is not just cancelling her appts because she is wanting on a form she needs to complete the process for the orange card. Pt doesn't want to pay for the visit to see dr Pollie Meyermcintyre. She doesn't know how much longer before she is approved for the orange card She would like dr Pollie Meyermcintyre to call her

## 2013-10-21 ENCOUNTER — Telehealth: Payer: Self-pay | Admitting: Family Medicine

## 2013-10-21 NOTE — Telephone Encounter (Signed)
Called pt. LMVM to call back. PLEASE tell pt to schedule OV with PCP. No refills on pain meds without OV. Thanks. Arlyss Repress.Zohair Epp

## 2013-10-21 NOTE — Telephone Encounter (Signed)
Pt wants a refill on her pain medication. jw

## 2013-11-10 ENCOUNTER — Encounter: Payer: Self-pay | Admitting: Family Medicine

## 2013-11-10 ENCOUNTER — Ambulatory Visit (INDEPENDENT_AMBULATORY_CARE_PROVIDER_SITE_OTHER): Payer: Self-pay | Admitting: Family Medicine

## 2013-11-10 VITALS — BP 125/84 | HR 78 | Temp 98.0°F | Ht 66.0 in | Wt 155.6 lb

## 2013-11-10 DIAGNOSIS — G8929 Other chronic pain: Secondary | ICD-10-CM

## 2013-11-10 DIAGNOSIS — M25559 Pain in unspecified hip: Secondary | ICD-10-CM

## 2013-11-10 DIAGNOSIS — L723 Sebaceous cyst: Secondary | ICD-10-CM

## 2013-11-10 DIAGNOSIS — M25551 Pain in right hip: Principal | ICD-10-CM

## 2013-11-10 DIAGNOSIS — R011 Cardiac murmur, unspecified: Secondary | ICD-10-CM

## 2013-11-10 DIAGNOSIS — L729 Follicular cyst of the skin and subcutaneous tissue, unspecified: Secondary | ICD-10-CM

## 2013-11-10 MED ORDER — HYDROCODONE-ACETAMINOPHEN 10-325 MG PO TABS: 1.0000 | ORAL_TABLET | Freq: Four times a day (QID) | ORAL | Status: DC | PRN

## 2013-11-10 NOTE — Patient Instructions (Signed)
I refilled your medicines for 2 months worth. You MUST have an appointment in the future to receive any further pain medicines. Call Delbert HarnessMurphy Wainer to set up an appointment. This is very important.  If your shortness of breath worsens, come back. Otherwise we will plan to do an ultrasound of your heart once you get the orange card.  Be well, Dr. Pollie MeyerMcIntyre

## 2013-11-15 DIAGNOSIS — R011 Cardiac murmur, unspecified: Secondary | ICD-10-CM | POA: Insufficient documentation

## 2013-11-15 NOTE — Assessment & Plan Note (Signed)
Worsening R hip pain likely secondary to being out of narcotic medications.  - Reiterated to pt that in future no further refills will be given unless she schedules an office visit - Had frank discussion about importance of getting in with ortho Delbert Harness). Pt to call and schedule an appointment. - Had wanted to get UDS today since last UDS did not contain opiates, but pt has been out x 1 month so would expect it to be negative anyways. - Refilled norco x 2 months worth. Will get UDS at next visit.

## 2013-11-15 NOTE — Assessment & Plan Note (Signed)
Auscultated on exam today. Per pt, has been told had heart murmur in past. Seems asymptomatic except for stable intermittent SOB which is chronic and does not hinder her from doing her daily activities. No syncope or chest pain. After discussion with pt today, will continue to monitor symptoms and auscultate in the future. Would like to eventually get echo, but does not have Orange Card right now so will hold off on echo for now. Pt agreeable with this plan.

## 2013-11-15 NOTE — Progress Notes (Signed)
Patient ID: Angela Walls, female   DOB: [birth year], 33 y.o.   MRN: [id number]  HPI:  Chronic hip pain: has been out of pain medicine for the last month. Normally takes norco 10-325mg  one pill 2-3 times per day but sometimes has had to take more frequently if pain is flaring. Pain in her R hip radiates down to her R knee. Has been limping x 1 week. Has not followed up with ortho despite our previous discussions about needing to do this. Working with Britta Mccreedy on getting the [employer] to cover her [facility] visits (previously had this, but it seems to have expired).  Bump on R arm: does not itch or hurt. Has been there for several months. Wants to know what to do about it.  Murmur: auscultated on exam today. Denies chest pain, syncope, swelling. Occasional SOB which pt states has been present for years, is unchanged, and which does not hinder her activity.  ROS: See HPI  PMFSH: hx AVN of bilateral hips. R hip pain worse than L.  PHYSICAL EXAM: BP 125/84  Pulse 78  Temp(Src) 98 F (36.7 C) (Oral)  Ht 5\' 6"  (1.676 m)  Wt 155 lb 9.6 oz (70.58 kg)  BMI 25.13 kg/m2 Gen: NAD HEENT: NCAT Heart: RRR, 2/6 early systolic murmur loudest at RUSB Lungs: CTAB, NWOB Neuro: grossly nonfocal, speech normal MSK: marked pain with internal rotation of R hip. Full strength in bilat lower ext although pain exacerbated by hip flexion on R. R hip nontender to palpation. Skin: R outer arm with small pustule-like lesion appx 67mm in diameter, does not express fluid when squeezed. Firm. No surrounding erythema, warmth, or drainage.  ASSESSMENT/PLAN:  # Bump on R arm - consistent with small superficial cyst. Advised we could drain this but pt will need to schedule separate procedure visit if she wants this done. Precepted with Dr. Gwendolyn Grant who also examined patient and agrees with this plan.   See problem based charting for additional assessment/plan.  FOLLOW UP: F/u in 2 months for chronic pain Separate procedure  visit for R arm cyst removal if desired.  [place] J. Pollie Meyer, MD [facility]

## 2013-12-02 ENCOUNTER — Telehealth: Payer: Self-pay | Admitting: Family Medicine

## 2013-12-02 NOTE — Telephone Encounter (Signed)
Pt dropped off form to be completed for her work It had been completed before but her employer lost the form

## 2013-12-03 NOTE — Telephone Encounter (Signed)
Placed in Dr. Valorie RooseveltMcIntyre's box. Angela Walls

## 2013-12-03 NOTE — Telephone Encounter (Signed)
I am not able to complete this form. This looks like a chronic disability form, not FMLA paperwork or short term disability. Pt will need to see a disability physician if she needs this form completed. Please inform pt. I will be on vacation next week and have left the form in my inbox if pt needs it back.  Latrelle DodrillBrittany J McIntyre, MD

## 2013-12-06 NOTE — Telephone Encounter (Signed)
Left message for patient to pick up form if she needs to retrieve it. MS

## 2013-12-07 ENCOUNTER — Telehealth: Payer: Self-pay | Admitting: Family Medicine

## 2013-12-07 NOTE — Telephone Encounter (Signed)
Ms. Angela Walls was out of work due to hip issue.  STD is usually given through employer for a patient's temporary absence from work to due illness or injury.  Form patient left was one that was filled out by provider previously.  The patient's STD ins co misplaced form and needed a new one on file to continue paying patient for her time out of work.  Please complete form and inform patient when ready for pickup.

## 2013-12-14 ENCOUNTER — Encounter (HOSPITAL_COMMUNITY): Payer: Self-pay | Admitting: Emergency Medicine

## 2013-12-14 ENCOUNTER — Emergency Department (INDEPENDENT_AMBULATORY_CARE_PROVIDER_SITE_OTHER)
Admission: EM | Admit: 2013-12-14 | Discharge: 2013-12-14 | Disposition: A | Discharge status: Home / Self Care | Payer: Self-pay | Source: Home / Self Care | Attending: Family Medicine | Admitting: Family Medicine

## 2013-12-14 DIAGNOSIS — N926 Irregular menstruation, unspecified: Secondary | ICD-10-CM

## 2013-12-14 LAB — POCT URINALYSIS DIP (DEVICE)
Bilirubin Urine: NEGATIVE
Glucose, UA: NEGATIVE mg/dL
Hgb urine dipstick: NEGATIVE
Ketones, ur: NEGATIVE mg/dL
Leukocytes, UA: NEGATIVE
Nitrite: NEGATIVE
Protein, ur: NEGATIVE mg/dL
Specific Gravity, Urine: 1.025 (ref 1.005–1.030)
Urobilinogen, UA: 1 mg/dL (ref 0.0–1.0)
pH: 6 (ref 5.0–8.0)

## 2013-12-14 LAB — POCT PREGNANCY, URINE: Preg Test, Ur: NEGATIVE

## 2013-12-14 NOTE — ED Provider Notes (Signed)
CSN: [id number]     Arrival date & time 12/14/13  [id number] History   First MD Initiated Contact with Patient 12/14/13 1000     No chief complaint on file.  (Consider location/radiation/quality/duration/timing/severity/associated sxs/prior Treatment) HPI Comments: 33 year old female presents for evaluation of possible pregnancy. She is one week late on her menstrual period. This is very abnormal for her, she is usually very regular. She denies any other symptoms at this time. No history of thyroid problems.   Past Medical History  Diagnosis Date  . Lupus     Followed by Rheum. On chronic Prednisone.  . Hypertension   . Anemia   . Avascular necrosis of femoral head     Bilateral. On chronic narcotics for this.   Past Surgical History  Procedure Laterality Date  . Appendectomy  2001  . Cesarean section  2010   Family History  Problem Relation Age of Onset  . Hypertension Maternal Grandmother   . Hypertension Paternal Grandmother   . Stroke Neg Hx   . Heart disease Neg Hx   . Cancer Neg Hx   . Hypertension Father    History  Substance Use Topics  . Smoking status: Never Smoker   . Smokeless tobacco: Never Used  . Alcohol Use: No   OB History   Grav Para Term Preterm Abortions TAB SAB Ect Mult Living   3 2  2 1  1   2      Review of Systems  Genitourinary: Positive for menstrual problem.       See history of present illness  All other systems reviewed and are negative.   Allergies  Penicillins  Home Medications   Prior to Admission medications   Medication Sig Start Date End Date Taking? Authorizing Provider  cholecalciferol (VITAMIN D) 1000 UNITS tablet Take 1,000 Units by mouth daily.    Historical Provider, MD  HYDROcodone-acetaminophen (NORCO) 10-325 MG per tablet Take 1 tablet by mouth every 6 (six) hours as needed for severe pain. DO NOT FILL BEFORE 12/11/13 12/11/13   Latrelle DodrillBrittany J McIntyre, MD  ibuprofen (ADVIL,MOTRIN) 800 MG tablet Take 1 tablet (800 mg total) by  mouth every 8 (eight) hours as needed. 09/17/13   Delbert PhenixLinda M Barefoot, NP  labetalol (NORMODYNE) 100 MG tablet Take 100 mg by mouth 2 (two) times daily.    Historical Provider, MD  metroNIDAZOLE (FLAGYL) 500 MG tablet Take 1 tablet (500 mg total) by mouth 2 (two) times daily. 09/17/13   Delbert PhenixLinda M Barefoot, NP  Multiple Vitamin (MULTIVITAMIN WITH MINERALS) TABS tablet Take 1 tablet by mouth daily.    Historical Provider, MD   BP 136/66  Pulse 73  Temp(Src) 98.5 F (36.9 C) (Oral)  Resp 16  SpO2 100% Physical Exam  Nursing note and vitals reviewed. Constitutional: She is oriented to person, place, and time. Vital signs are normal. She appears well-developed and well-nourished. No distress.  HENT:  Head: Normocephalic and atraumatic.  Pulmonary/Chest: Effort normal. No respiratory distress.  Neurological: She is alert and oriented to person, place, and time. She has normal strength. Coordination normal.  Skin: Skin is warm and dry. No rash noted. She is not diaphoretic.  Psychiatric: She has a normal mood and affect. Judgment normal.    ED Course  Procedures (including critical care time) Labs Review Labs Reviewed  POCT URINALYSIS DIP (DEVICE)  POCT PREGNANCY, URINE    Imaging Review No results found.   MDM   1. Missed period    Urine pregnancy test  is negative. She will wait a couple weeks, and if the. Doesn't start she will recheck a urine pregnancy test. If positive, followup with OB. If negative, followup with GYN   Graylon GoodZachary H Morse Brueggemann, PA-C 12/14/13 1034

## 2013-12-14 NOTE — ED Notes (Signed)
Wanting pregnancy test

## 2013-12-14 NOTE — Telephone Encounter (Signed)
We have a scanned copy of the form I previously filled out for pt, which can replace the one her employer lost since it is an exact copy of the lost completed form. I have printed this off and placed on Tamika's desk for pt to pick up.  Latrelle DodrillBrittany J Georgana Romain, MD

## 2013-12-14 NOTE — Discharge Instructions (Signed)
Menstruation °Menstruation is the monthly passing of blood, tissue, fluid and mucus, also know as a period. Your body is shedding the lining of the uterus. The flow, or amount of blood, usually lasts from 3-7 days each month. Hormones control the menstrual cycle. Hormones are a chemical substance produced by endocrine glands in the body to regulate different bodily functions. °The first menstrual period may start any time between age 32 years to 16 years. However, it usually starts around age 12 years. Some girls have regular monthly menstrual cycles right from the beginning. However, it is not unusual to have only a couple of drops of blood or spotting when you first start menstruating. It is also not unusual to have two periods a month or miss a month or two when first starting your periods. °SYMPTOMS  °· Mild to moderate abdominal cramps. °· Aching or pain in the lower back area. °Symptoms may occur 5-10 days before your menstrual period starts. These symptoms are referred to as premenstrual syndrome (PMS). These symptoms can include: °· Headache. °· Breast tenderness and swelling. °· Bloating. °· Tiredness (fatigue). °· Mood changes. °· Craving for certain foods. °These are normal signs and symptoms and can vary in severity. To help relieve these problems, ask your caregiver if you can take over-the-counter medications for pain or discomfort. If the symptoms are not controllable, see your caregiver for help.  °HORMONES INVOLVED IN MENSTRUATION °Menstruation comes about because of hormones produced by the pituitary gland in the brain and the ovaries that affect the uterine lining. °First, the pituitary gland in the brain produces the hormone follicle stimulating hormone (FSH). FSH stimulates the ovaries to produce estrogen, which thickens the uterine lining and begins to develop an egg in the ovary. About 14 days later, the pituitary gland produces another hormone called luteinizing hormone (LH). LH causes the egg  to come out of a sac in the ovary (ovulation). The empty sac on the ovary called the corpus luteum is stimulated by another hormone from the pituitary gland called luteotropin. The corpus luteum begins to produce the estrogen and progesterone hormone. The progesterone hormone prepares the lining of the uterus to have the fertilized egg (egg combined with sperm) attach to the lining of the uterus and begin to develop into a fetus. If the egg is not fertilized, the corpus luteum stops producing estrogen and progesterone, it disappears, the lining of the uterus sloughs off and a menstrual period begins. Then the menstrual cycle starts all over again and will continue monthly unless pregnancy occurs or menopause begins. °The secretion of hormones is complex. Various parts of the body become involved in many chemical activities. Female sex hormones have other functions in a woman's body as well. Estrogen increases a woman's sex drive (libido). It naturally helps body get rid of fluids (diuretic). It also aids in the process of building new bone. Therefore, maintaining hormonal health is essential to all levels of a woman's well being. These hormones are usually present in normal amounts and cause you to menstruate. It is the relationship between the (small) levels of the hormones that is critical. When the balance is upset, menstrual irregularities can occur. °HOW DOES THE MENSTRUAL CYCLE HAPPEN? °· Menstrual cycles vary in length from 21-35 days with an average of 29 days. The cycle begins on the first day of bleeding. At this time, the pituitary gland in the brain releases FSH that travels through the bloodstream to the ovaries. The FSH stimulates the follicles in the   ovaries. This prepares the body for ovulation that occurs around the 14th day of the cycle. The ovaries produce estrogen, and this makes sure conditions are right in the uterus for implantation of the fertilized egg. °· When the levels of estrogen reach a  high enough level, it signals the gland in the brain (pituitary gland) to release a surge of LH. This causes the release of the ripest egg from its follicle (ovulation). Usually only one follicle releases one egg, but sometimes more than one follicle releases an egg especially when stimulating the ovaries for in vitro fertilization. The egg can then be collected by either fallopian tube to await fertilization. The burst follicle within the ovary that is left behind is now called the corpus luteum or "yellow body." The corpus luteum continues to give off (secrete) reduced amounts of estrogen. This closes and hardens the cervix. It dries up the mucus to the naturally infertile condition. °· The corpus luteum also begins to give off greater amounts of progesterone. This causes the lining of the uterus (endometrium) to thicken even more in preparation for the fertilized egg. The egg is starting to journey down from the fallopian tube to the uterus. It also signals the ovaries to stop releasing eggs. It assists in returning the cervical mucus to its infertile state. °· If the egg implants successfully into the womb lining and pregnancy occurs, progesterone levels will continue to raise. It is often this hormone that gives some pregnant women a feeling of well being, like a "natural high." Progesterone levels drop again after childbirth. °· If fertilization does not occur, the corpus luteum dies, stopping the production of hormones. This sudden drop in progesterone causes the uterine lining to break down, accompanied by blood (menstruation). °· This starts the cycle back at day 1. The whole process starts all over again. Woman go through this cycle every month from puberty to menopause. Women have breaks only for pregnancy and breastfeeding (lactation), unless the woman has health problems that affect the female hormone system or chooses to use oral contraceptives to have unnatural menstrual periods. °HOME CARE  INSTRUCTIONS  °· Keep track of your periods by using a calendar. °· If you use tampons, get the least absorbent to avoid toxic shock syndrome. °· Do not leave tampons in the vagina over night or longer than 6 hours. °· Wear a sanitary pad over night. °· Exercise 3-5 times a week or more. °· Avoid foods and drinks that you know will make your symptoms worse before or during your period. °SEEK MEDICAL CARE IF:  °· You develop a fever with your period. °· Your periods are lasting more than 7 days. °· Your period is so heavy that you have to change pads or tampons every 30 minutes. °· You develop clots with your period and never had clots before. °· You cannot get relief from over-the-counter medication for your symptoms. °· Your period has not started, and it has been longer than 35 days. °Document Released: 05/31/2002 Document Revised: 06/15/2013 Document Reviewed: 01/07/2013 °ExitCare® Patient Information ©2015 [employer]. This information is not intended to replace advice given to you by your health care provider. Make sure you discuss any questions you have with your health care provider. ° °

## 2013-12-14 NOTE — ED Provider Notes (Signed)
Medical screening examination/treatment/procedure(s) were performed by resident physician or non-physician practitioner and as supervising physician I was immediately available for consultation/collaboration.   Zahmir Lalla DOUGLAS MD.   Terah Robey D Bettyann Birchler, MD 12/14/13 1510 

## 2013-12-15 NOTE — Telephone Encounter (Signed)
Left voice message for pt informing her that the form is ready for pick up. Clovis PuMartin, Emmeline Winebarger L, RN

## 2014-01-05 ENCOUNTER — Telehealth: Payer: Self-pay | Admitting: Family Medicine

## 2014-01-05 DIAGNOSIS — M87059 Idiopathic aseptic necrosis of unspecified femur: Secondary | ICD-10-CM

## 2014-01-05 NOTE — Telephone Encounter (Signed)
Pt called and would like a referral to a Ortho doctor. jw

## 2014-01-06 NOTE — Telephone Encounter (Signed)
Left message on voicemail for patient to call back regarding referral.

## 2014-01-10 NOTE — Telephone Encounter (Signed)
Called and explained to patient that she must call [facility] and apply for their financial assistant program, if approved they will schedule her an appointment for ortho.Angela Walls, [name]

## 2014-01-10 NOTE — Telephone Encounter (Signed)
Spoke with patient, states MD told her to make appt with ortho at last visit, informed her that no ortho office in [place] accepts orange card, patient willing to go to [facility] trough their financial assistance program. Will forward to PCP to place referral.

## 2014-01-10 NOTE — Telephone Encounter (Signed)
Referral entered Colletta Spillers J Aidenn Skellenger, MD  

## 2014-01-12 ENCOUNTER — Encounter: Payer: Self-pay | Admitting: Family Medicine

## 2014-01-12 ENCOUNTER — Ambulatory Visit (INDEPENDENT_AMBULATORY_CARE_PROVIDER_SITE_OTHER): Payer: Self-pay | Admitting: Family Medicine

## 2014-01-12 VITALS — BP 122/83 | HR 78 | Temp 98.2°F | Wt 151.4 lb

## 2014-01-12 DIAGNOSIS — M25559 Pain in unspecified hip: Secondary | ICD-10-CM

## 2014-01-12 DIAGNOSIS — G8929 Other chronic pain: Secondary | ICD-10-CM

## 2014-01-12 DIAGNOSIS — M25551 Pain in right hip: Principal | ICD-10-CM

## 2014-01-12 DIAGNOSIS — N920 Excessive and frequent menstruation with regular cycle: Secondary | ICD-10-CM | POA: Insufficient documentation

## 2014-01-12 LAB — CBC
HCT: 34.6 % — ABNORMAL LOW (ref 36.0–46.0)
Hemoglobin: 11.7 g/dL — ABNORMAL LOW (ref 12.0–15.0)
MCH: 28.7 pg (ref 26.0–34.0)
MCHC: 33.8 g/dL (ref 30.0–36.0)
MCV: 85 fL (ref 78.0–100.0)
Platelets: 327 10*3/uL (ref 150–400)
RBC: 4.07 MIL/uL (ref 3.87–5.11)
RDW: 14.4 % (ref 11.5–15.5)
WBC: 6.2 10*3/uL (ref 4.0–10.5)

## 2014-01-12 LAB — POCT HEMOGLOBIN: Hemoglobin: 11.7 g/dL — AB (ref 12.2–16.2)

## 2014-01-12 MED ORDER — HYDROCODONE-ACETAMINOPHEN 10-325 MG PO TABS: 1.0000 | ORAL_TABLET | Freq: Four times a day (QID) | ORAL | Status: DC | PRN

## 2014-01-12 NOTE — Assessment & Plan Note (Signed)
Worsening pain, likely secondary to disease progression. Pt now motivated to get in with ortho due to this pain. -Refilled norco #120 for one months supply -Could not get UDS today as has not taken since Sunday (4 days), will plan to do this at next visit -Pt to [facility] with [facility] assistance so she can get in with ortho there as no orthopedics offices take the orange card. -f/u in 1 mo.

## 2014-01-12 NOTE — Patient Instructions (Signed)
It was great to see you again today!  For hip pain: -[facility] -I refilled your medicine x 1 month  For heavy periods: -we are checking your hemoglobin and blood counts today -see handout below on birth control options to control bleeding  Follow up with me in 1 month  Be well, Dr. Pollie MeyerMcIntyre     Contraception Choices Contraception (birth control) is the use of any methods or devices to prevent pregnancy. Below are some methods to help avoid pregnancy. HORMONAL METHODS   Contraceptive implant. This is a thin, plastic tube containing progesterone hormone. It does not contain estrogen hormone. Your health care provider inserts the tube in the inner part of the upper arm. The tube can remain in place for up to 3 years. After 3 years, the implant must be removed. The implant prevents the ovaries from releasing an egg (ovulation), thickens the cervical mucus to prevent sperm from entering the uterus, and thins the lining of the inside of the uterus.  Progesterone-only injections. These injections are given every 3 months by your health care provider to prevent pregnancy. This synthetic progesterone hormone stops the ovaries from releasing eggs. It also thickens cervical mucus and changes the uterine lining. This makes it harder for sperm to survive in the uterus.  Birth control pills. These pills contain estrogen and progesterone hormone. They work by preventing the ovaries from releasing eggs (ovulation). They also cause the cervical mucus to thicken, preventing the sperm from entering the uterus. Birth control pills are prescribed by a health care provider.Birth control pills can also be used to treat heavy periods.  Minipill. This type of birth control pill contains only the progesterone hormone. They are taken every day of each month and must be prescribed by your health care provider.  Birth control patch. The patch contains hormones similar to those in  birth control pills. It must be changed once a week and is prescribed by a health care provider.  Vaginal ring. The ring contains hormones similar to those in birth control pills. It is left in the vagina for 3 weeks, removed for 1 week, and then a new one is put back in place. The patient must be comfortable inserting and removing the ring from the vagina.A health care provider's prescription is necessary.  Emergency contraception. Emergency contraceptives prevent pregnancy after unprotected sexual intercourse. This pill can be taken right after sex or up to 5 days after unprotected sex. It is most effective the sooner you take the pills after having sexual intercourse. Most emergency contraceptive pills are available without a prescription. Check with your pharmacist. Do not use emergency contraception as your only form of birth control. BARRIER METHODS   Female condom. This is a thin sheath (latex or rubber) that is worn over the penis during sexual intercourse. It can be used with spermicide to increase effectiveness.  Female condom. This is a soft, loose-fitting sheath that is put into the vagina before sexual intercourse.  Diaphragm. This is a soft, latex, dome-shaped barrier that must be fitted by a health care provider. It is inserted into the vagina, along with a spermicidal jelly. It is inserted before intercourse. The diaphragm should be left in the vagina for 6 to 8 hours after intercourse.  Cervical cap. This is a round, soft, latex or plastic cup that fits over the cervix and must be fitted by a health care provider. The cap can be left in place for up to 48  hours after intercourse.  Sponge. This is a soft, circular piece of polyurethane foam. The sponge has spermicide in it. It is inserted into the vagina after wetting it and before sexual intercourse.  Spermicides. These are chemicals that kill or block sperm from entering the cervix and uterus. They come in the form of creams,  jellies, suppositories, foam, or tablets. They do not require a prescription. They are inserted into the vagina with an applicator before having sexual intercourse. The process must be repeated every time you have sexual intercourse. INTRAUTERINE CONTRACEPTION  Intrauterine device (IUD). This is a T-shaped device that is put in a woman's uterus during a menstrual period to prevent pregnancy. There are 2 types:  Copper IUD. This type of IUD is wrapped in copper wire and is placed inside the uterus. Copper makes the uterus and fallopian tubes produce a fluid that kills sperm. It can stay in place for 10 years.  Hormone IUD. This type of IUD contains the hormone progestin (synthetic progesterone). The hormone thickens the cervical mucus and prevents sperm from entering the uterus, and it also thins the uterine lining to prevent implantation of a fertilized egg. The hormone can weaken or kill the sperm that get into the uterus. It can stay in place for 3-5 years, depending on which type of IUD is used. PERMANENT METHODS OF CONTRACEPTION  Female tubal ligation. This is when the woman's fallopian tubes are surgically sealed, tied, or blocked to prevent the egg from traveling to the uterus.  Hysteroscopic sterilization. This involves placing a small coil or insert into each fallopian tube. Your doctor uses a technique called hysteroscopy to do the procedure. The device causes scar tissue to form. This results in permanent blockage of the fallopian tubes, so the sperm cannot fertilize the egg. It takes about 3 months after the procedure for the tubes to become blocked. You must use another form of birth control for these 3 months.  Female sterilization. This is when the female has the tubes that carry sperm tied off (vasectomy).This blocks sperm from entering the vagina during sexual intercourse. After the procedure, the man can still ejaculate fluid (semen). NATURAL PLANNING METHODS  Natural family planning.  This is not having sexual intercourse or using a barrier method (condom, diaphragm, cervical cap) on days the woman could become pregnant.  Calendar method. This is keeping track of the length of each menstrual cycle and identifying when you are fertile.  Ovulation method. This is avoiding sexual intercourse during ovulation.  Symptothermal method. This is avoiding sexual intercourse during ovulation, using a thermometer and ovulation symptoms.  Post-ovulation method. This is timing sexual intercourse after you have ovulated. Regardless of which type or method of contraception you choose, it is important that you use condoms to protect against the transmission of sexually transmitted infections (STIs). Talk with your health care provider about which form of contraception is most appropriate for you. Document Released: 06/10/2005 Document Revised: 06/15/2013 Document Reviewed: 12/03/2012 [facility] Patient Information 2015 [place], [place]. This information is not intended to replace advice given to you by your health care provider. Make sure you discuss any questions you have with your health care provider.

## 2014-01-12 NOTE — Progress Notes (Signed)
Patient ID: Angela Walls, female   DOB: 08/28/1980, 33 y.o.   MRN: [id number]  HPI:  Hip pain: needs refill of pain meds. Takes norco about 3x per day, sometimes takes 2 pills at a time if the pain is really bad. She doesn't like taking it that much due to drowsiness, does not want to increase her dose. Ran out on Sunday so hasn't taken it in a few days. The pain is worsening and is now a burning type pain in her R hip. Used to be throbbing that came and went, but now is more severe.  Heavy periods: had been on depo for a long time until October 2014 when she stopped it. Did not have a period until March. Since then periods have been heavy, bleeding 4-5 days at a time. Currently on third day of her period. Has hx of anemia and used to take iron pills. Has felt very tired for the last 2 days. Shortness of breath is not worse than normal.   ROS: See HPI  PMFSH: hx SLE, AVN bilat hips  PHYSICAL EXAM: BP 122/83  Pulse 78  Temp(Src) 98.2 F (36.8 C) (Oral)  Wt 151 lb 6.4 oz (68.675 kg)  LMP 01/10/2014 Gen: NAD HEENT: NCAT Heart: RRR, no murmurs heard today Lungs: CTAB, NWOB Neuro: grossly nonfocal speech normal Ext: No appreciable lower extremity edema bilaterally, R hip nontender to palpation. Pain with internal rotation of R hip, and decreased ROM with this motion. Full strength in bilat lower ext.   ASSESSMENT/PLAN:  See problem based charting for additional assessment/plan.  FOLLOW UP: F/u in 1 month for hip pain and contraception discussion.  GrenadaBrittany J. Pollie MeyerMcIntyre, MD [facility]

## 2014-01-12 NOTE — Assessment & Plan Note (Signed)
Periods heavy since stopping depo. Feels fatigued -check CBC and POCT Hgb today to ensure not very anemic -given handout on birth control methods as will likely require hormonal therapy to decrease her heavy periods -f/u in 1 mo

## 2014-01-26 ENCOUNTER — Ambulatory Visit: Payer: Self-pay

## 2014-01-27 ENCOUNTER — Telehealth: Payer: Self-pay | Admitting: Family Medicine

## 2014-01-27 NOTE — Telephone Encounter (Signed)
Pt called and wanted the phone number that was given to her by us for the orange card program at [facility]. jw

## 2014-02-01 NOTE — Telephone Encounter (Signed)
Line busy number for [facility] charity care is [phone].

## 2014-02-09 ENCOUNTER — Telehealth: Payer: Self-pay | Admitting: Family Medicine

## 2014-02-09 NOTE — Telephone Encounter (Signed)
Will not refill without office visit. Have discussed this with patient in the past. She was seen one month ago and was supposed to follow up around this time. Please tell pt to schedule appointment.  Latrelle DodrillBrittany J Huyen Perazzo, MD

## 2014-02-09 NOTE — Telephone Encounter (Signed)
Pt called and would like to have a refill on her pain medication left up front for pickup. Please call when ready jw

## 2014-02-09 NOTE — Telephone Encounter (Signed)
LMOVM for pt to call us back. Read message below. [place], [place] CMA

## 2014-02-10 NOTE — Telephone Encounter (Signed)
Pt called back and feels that she doesn't have to wait to get her medication. If she needs her medication then you should give this to without her having to wait to get an appointment. Now she has to wait until 9/1 just to get her pain medication. jw

## 2014-02-14 ENCOUNTER — Telehealth: Payer: Self-pay | Admitting: Family Medicine

## 2014-02-14 NOTE — Telephone Encounter (Signed)
Pt called and was checking on the status of her referral to [facility]. She said we gave her a incorrect number for the charity ( orange card) I explained that we do not have access to all of [facility] numbers and that she could also look up the numbers and try calling around in [facility]. Please call with a update on when or what else she needs to do. jw

## 2014-02-15 NOTE — Telephone Encounter (Signed)
Left message for patient, number provided in previous phone note is correct ([phone]) option 1. We cant go any further with her referral at [facility] until she applies and get accepted into their payment program. Patient is welcome to call local ortho offices to see if they would be willing to set her up with a payment plan although most offices require between $150-$250+ up front for uninsured patients.

## 2014-02-16 MED ORDER — HYDROCODONE-ACETAMINOPHEN 10-325 MG PO TABS: 1.0000 | ORAL_TABLET | Freq: Four times a day (QID) | ORAL | Status: DC | PRN

## 2014-02-16 NOTE — Telephone Encounter (Signed)
Patient informed of message from MD, expressed understanding. 

## 2014-02-16 NOTE — Telephone Encounter (Signed)
I will provide a short term supply of medication for patient to last her until her appointment. Will place at front desk for her to pick up.  Please inform that this will not be done again in the future. She will have to schedule an appointment to get pain medicines refilled.  Latrelle Dodrill, MD

## 2014-02-16 NOTE — Telephone Encounter (Signed)
Spoke with patient, she states that [facility] finally mailed out her paperwork for the financial assistance program but she still has to wait to be approved before ortho will see her. She is upset that she has to wait until her appt on 9/1 for refill on her pain medication when she has to wait so long for referral. She request that PCP or office manager calls her.

## 2014-02-17 ENCOUNTER — Ambulatory Visit: Payer: Self-pay | Admitting: Family Medicine

## 2014-02-22 ENCOUNTER — Encounter: Payer: Self-pay | Admitting: Family Medicine

## 2014-02-22 ENCOUNTER — Ambulatory Visit (INDEPENDENT_AMBULATORY_CARE_PROVIDER_SITE_OTHER): Payer: Self-pay | Admitting: Family Medicine

## 2014-02-22 VITALS — BP 116/66 | HR 71 | Temp 98.3°F | Wt 152.0 lb

## 2014-02-22 DIAGNOSIS — M25559 Pain in unspecified hip: Secondary | ICD-10-CM

## 2014-02-22 DIAGNOSIS — F1190 Opioid use, unspecified, uncomplicated: Secondary | ICD-10-CM

## 2014-02-22 DIAGNOSIS — F1110 Opioid abuse, uncomplicated: Secondary | ICD-10-CM

## 2014-02-22 DIAGNOSIS — M25551 Pain in right hip: Secondary | ICD-10-CM

## 2014-02-22 DIAGNOSIS — G8929 Other chronic pain: Secondary | ICD-10-CM

## 2014-02-22 MED ORDER — HYDROCODONE-ACETAMINOPHEN 10-325 MG PO TABS: 1.0000 | ORAL_TABLET | Freq: Four times a day (QID) | ORAL | Status: DC | PRN

## 2014-02-22 NOTE — Progress Notes (Signed)
Patient ID: Angela Walls, female   DOB: 1980-11-26, 33 y.o.   MRN: [id number]  HPI:  Hip pain - takes norco 4x/day. It helps the pain, worse with being on feet. 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 Took this medicine yesterday and today. R hip is the main area that hurts. Working on getting in with [facility] orthopedics, has to do financial counseling first before can be scheduled. She reports her disability was denied for her hip pain.  ROS: See HPI.  PMFSH: hx avascular necrosis of femoral head, SLE, HTN  PHYSICAL EXAM: BP 116/66  Pulse 71  Temp(Src) 98.3 F (36.8 C) (Oral)  Wt 152 lb (68.947 kg) Gen: NAD HEENT: NCAT Neuro: grossly nonfocal speech normal Ext: full strength in bilat lower extremities. Pain with internal & external rotation of R hip. Atraumatic legs.  ASSESSMENT/PLAN:  Chronic right hip pain Working on getting in with [facility] ortho which will be important long-term. Plan: -refill norco x 2 months -check UDS today -continue efforts to be seen by ortho -f/u with me in 2 months   FOLLOW UP: F/u in 2 months for hip pain.  [place] J. Pollie Meyer, MD [facility]

## 2014-02-22 NOTE — Assessment & Plan Note (Signed)
Working on getting in with [facility] ortho which will be important long-term. Plan: -refill norco x 2 months -check UDS today -continue efforts to be seen by ortho -f/u with me in 2 months

## 2014-02-22 NOTE — Patient Instructions (Addendum)
It was great to see you again today!  I refilled your pain medicine for 2 months.  Keep working on getting into [facility] orthopedics. Follow up with me in 2 months.  Be well, [name]

## 2014-02-23 LAB — DRUG SCR UR, PAIN MGMT, REFLEX CONF
Amphetamine Screen, Ur: NEGATIVE
Barbiturate Quant, Ur: NEGATIVE
Benzodiazepines.: NEGATIVE
Cocaine Metabolites: NEGATIVE
Creatinine,U: 223.08 mg/dL
Marijuana Metabolite: NEGATIVE
Methadone: NEGATIVE
Phencyclidine (PCP): NEGATIVE
Propoxyphene: NEGATIVE

## 2014-02-27 LAB — OPIATES/OPIOIDS (LC/MS-MS)
Codeine Urine: NEGATIVE ng/mL (ref <50)
Hydrocodone: 151 ng/mL — ABNORMAL HIGH (ref <50)
Hydromorphone: 196 ng/mL — ABNORMAL HIGH (ref <50)
Morphine Urine: NEGATIVE ng/mL (ref <50)
Norhydrocodone, Ur: 359 ng/mL — ABNORMAL HIGH (ref <50)
Noroxycodone, Ur: NEGATIVE ng/mL (ref <50)
Oxycodone, ur: NEGATIVE ng/mL (ref <50)
Oxymorphone: NEGATIVE ng/mL (ref <50)

## 2014-03-01 ENCOUNTER — Ambulatory Visit: Payer: Self-pay

## 2014-03-15 ENCOUNTER — Ambulatory Visit: Payer: Self-pay

## 2014-04-21 ENCOUNTER — Telehealth: Payer: Self-pay | Admitting: Family Medicine

## 2014-04-21 NOTE — Telephone Encounter (Signed)
Pt called and wanted Dr. Pollie MeyerMcIntyre to know that [facility] is [facility]. The problem is that she feels that they are dragging their feet while trying to process her application for financial help> She wanted Dr. Pollie MeyerMcIntyre to call them and see what the hold up is because she needs to see who is going to be giving her pain medication and help. Please call patient to discuss her other options. jw

## 2014-04-25 ENCOUNTER — Encounter: Payer: Self-pay | Admitting: Family Medicine

## 2014-04-25 ENCOUNTER — Other Ambulatory Visit: Payer: Self-pay | Admitting: Family Medicine

## 2014-04-25 NOTE — Telephone Encounter (Signed)
Pt called and needs a refill on her pain medication left up front for pick up. Please call when ready. jw °

## 2014-04-25 NOTE — Telephone Encounter (Signed)
First available appointment with MD isnt until 11/25

## 2014-04-26 NOTE — Telephone Encounter (Signed)
Pt called and wanted to check the status of her pain medication. She is now out. PCP doesn't have any appointment until December 2015 at this time. Please call patient at [phone]. Myriam Jacobsonjw

## 2014-04-27 ENCOUNTER — Telehealth: Payer: Self-pay | Admitting: Family Medicine

## 2014-04-27 MED ORDER — HYDROCODONE-ACETAMINOPHEN 10-325 MG PO TABS: 1.0000 | ORAL_TABLET | Freq: Four times a day (QID) | ORAL | Status: DC | PRN

## 2014-04-27 NOTE — Telephone Encounter (Signed)
Rx written, will place at front desk. Pt should schedule appt as soon as she is able.  Latrelle DodrillBrittany J Yuli Lanigan, MD

## 2014-04-27 NOTE — Telephone Encounter (Signed)
I am not able to make this move any faster. Will discuss with pt when she follows up in clinic. Latrelle DodrillBrittany J Ajaya Crutchfield, MD

## 2014-04-27 NOTE — Telephone Encounter (Signed)
Pt completed form requesting PCP change for her and her children, Angela Walls and Angela Walls Burch Placed on [employer]ak[employer]ettes desk

## 2014-04-27 NOTE — Telephone Encounter (Signed)
Tried calling patient, line busy x 2. When patient calls back please schedule her for MD's first available.

## 2014-05-05 NOTE — Telephone Encounter (Signed)
Patient completed Request to Change PCP form.  Reason states--"I have had several issues with receiving pain medication.  I call days in advance, schedule appointments when told and this time my request was ignored.  I have been a patient for at least 10 years and this has been the worst.  Please also change my children's doctor (Tyjae Blackmon-10/22/08 & Alinda Moneyony Burch-03/25/98).  Called patient for additional info & left message to call our office back.  Altamese Dilling~Sherri Mcarthy, BSN, RN-BC

## 2014-05-16 NOTE — Telephone Encounter (Signed)
Pt is returning Angela Walls's call.

## 2014-05-25 NOTE — Telephone Encounter (Signed)
Pt is returned call again. Is very frustrated about not getting return calls Please call her [phone]

## 2014-05-26 NOTE — Telephone Encounter (Signed)
Spoke with patient regarding call back for changing PCP.  Patient want to get her pain medication refilled.  Wanted to know when this can be done.  Refuses to see Dr. Pollie MeyerMcIntyre for this.

## 2014-05-27 ENCOUNTER — Other Ambulatory Visit: Payer: Self-pay | Admitting: Family Medicine

## 2014-05-27 MED ORDER — HYDROCODONE-ACETAMINOPHEN 10-325 MG PO TABS: 1.0000 | ORAL_TABLET | Freq: Four times a day (QID) | ORAL | Status: DC | PRN

## 2014-05-27 NOTE — Telephone Encounter (Signed)
Left mssg on voice mail. The essentials of the message were ; I have reviewed her chart. It is clinic policy for a patient to adhere to certain rules regarding prescribing narcotics. One of these rules is to appear in clinic for re-evaluation on a regular basis in order for narcotics to continue to be prescribed. Sometimes this requirement is on a monthly or even more frequent basis. I understand that can be an inconvenience bu is a necessary part of our policy.  If she still swishes to have a PCP change we can do that, but the rules regarding rx narcotics will not change.. I apologized to her for taking so long to get back with her and left our clinic number. Hopefully she can call and leave me some TIMES that she would be available and I will attempt to call her back Denny LevySara Landen Knoedler

## 2014-05-27 NOTE — Telephone Encounter (Addendum)
05/27/14--Ms. Angela Walls called back to check on the status of her refill and PCP change request. States she is very familiar with the policy because she has been coming here for a long time and always comes for her appts and does what she is told to do. Was told to call a few days before she runs out of pain med. Each time she called, it would take days to get a refill. "I always do what I'm supposed to do on my end and just want the doctors to do the same on their end instead of giving me the runaround. Would still like to change her PCP. Has enough med to last until Monday. Will follow up with [name] on refill request and will propose tentative PCP change to [name], [name], or [name].  [name], BSN, RN-BC  05/27/14--Per Dr. Neal--Handwritten rx as I cannot print from this terminal PHONE conversation; Angela Walls called back. She reports not having a problem coming in as requested but says our inability to schedule appointments any further out than a month has made the return visits problematic. She is correct in that we have not had the ability to schedule out very far in the last few months. She has been a long term patient here and seems to understand out policy. I think it was a confluence of issues causing this problem. We have agreed to give her a one month rx of narcotic now--I have hand written rx and it will be available for pick up at front desk.; We will re-assign her to new physician. She is aware our current policy re narcotic rx prescribing will continue.  I have spoken with [name] (clinic Asst Director) to co-ordinate these issues.  [name]   05/27/14--Rx ready to pick up per [name].  Called and informed patient.  Will change patient's and her children's PCP to Dr. [name] and patient informed to call back and schedule an appt with new PCP since January schedule not available yet.  Will need appt within next 2-3 weeks before next refill is due.  Patient  verbalized understanding and appreciative of refill and PCP change.  [name], BSN, RN-BC

## 2014-05-27 NOTE — Telephone Encounter (Signed)
See phone note for 04/27/14 & 05/27/14.   [name], BSN, RN-BC

## 2014-05-27 NOTE — Telephone Encounter (Signed)
Per Londell MohMichelle Kane--As best I can tell, patient is given appropriate amounts of pain medicine and then does not follow-up as instructed. She calls for a refill and is upset that she has to 1) come in for an office visit and 2) that the one she is able to schedule at this late date is farther off than she would like. [place]ittany accommodated once and gave her a short-term supply understanding that schedules aren't often out to schedule a follow-up and patient may forget to call. Nevertheless, it seems like appropriate care. I gleaned this info from telephone note dated 8/19 and office note dated 9/1. Office visit on 9/1, patient was given two month supply of Norco (120 tabs). My recommendation still remains below - that Dr. Valorie RooseveltMcIntyre's expectations for this patient are reasonable. She can switch physicians but she may encounter a similar process if she wants to continue to receive opioid prescriptions. See me if this does not make sense. Patient called again 12/2 so the sooner the response, the better.  05/27/14--Ms. Arlana Pouchate called back to check on the status of her refill and PCP change request. States she is very familiar with the policy because she has been coming here for a long time and always comes for her appts and does what she is told to do. Was told to call a few days before she runs out of pain med. Each time she called, it would take days to get a refill. "I always do what I'm supposed to do on my end and just want the doctors to do the same on their end instead of giving me the runaround. Would still like to change her PCP. Has enough med to last until Monday. Will follow up with [name] on refill request and will propose tentative PCP change to [name] or Richarda BladeAdamo. [name], BSN, RN-BC   See phone note for 04/21/14 & 05/27/14.  [name], BSN, RN-BC

## 2014-05-27 NOTE — Progress Notes (Addendum)
Handwritten rx as I cannot print from this terminal PHONE conversation; Ryanne called back. She reports not having a problem coming in as requested but says our inability to schedule appointments any further out than a month has made the return visits problematic. She is correct in that we have not had the ability to schedule out very far in the last few months. She has been a long term patient here and seems to understand out policy. I think it was a confluence of issues causing this problem. We have agreed to give her a one month rx of narcotic now--I have hand written rx and it will be available for pick up at front desk.; We will re-assign her to new physician. She is aware our current policy re narcotic rx prescribing will continue.  I have spoken with [name] (clinic Asst Director) to co-ordinate these issues. [name]

## 2014-06-27 ENCOUNTER — Ambulatory Visit (INDEPENDENT_AMBULATORY_CARE_PROVIDER_SITE_OTHER): Payer: Self-pay | Admitting: Family Medicine

## 2014-06-27 ENCOUNTER — Encounter: Payer: Self-pay | Admitting: Family Medicine

## 2014-06-27 VITALS — BP 143/90 | HR 69 | Temp 98.2°F | Ht 66.0 in | Wt 151.6 lb

## 2014-06-27 DIAGNOSIS — G8929 Other chronic pain: Secondary | ICD-10-CM

## 2014-06-27 DIAGNOSIS — M25551 Pain in right hip: Secondary | ICD-10-CM

## 2014-06-27 LAB — BASIC METABOLIC PANEL WITH GFR
BUN: 15 mg/dL (ref 6–23)
CO2: 26 mEq/L (ref 19–32)
Calcium: 8.8 mg/dL (ref 8.4–10.5)
Chloride: 106 mEq/L (ref 96–112)
Creat: 0.87 mg/dL (ref 0.50–1.10)
GFR, Est African American: >89 mL/min
GFR, Est Non African American: 88 mL/min
Glucose, Bld: 92 mg/dL (ref 70–99)
Potassium: 4.1 mEq/L (ref 3.5–5.3)
Sodium: 139 mEq/L (ref 135–145)

## 2014-06-27 MED ORDER — MELOXICAM 15 MG PO TABS: 15.0000 mg | ORAL_TABLET | Freq: Every day | ORAL | Status: DC

## 2014-06-27 MED ORDER — HYDROCODONE-ACETAMINOPHEN 10-325 MG PO TABS: 1.0000 | ORAL_TABLET | Freq: Four times a day (QID) | ORAL | Status: DC | PRN

## 2014-06-27 NOTE — Progress Notes (Signed)
HPI  Patient presents today for establishment of care with new primary care provider. Patient states she has been experiencing chronic right-sided hip pain for many years. She states that this is secondary to avascular necrosis of the femoral head. Patient is seeking better long-term care for this issue. 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  We discussed her long-term goals for this pain. She states that she would avoid hip replacement as much as possible. She states that she has never undergone physical therapy for this pain, however it is noted that she was referred to physical therapy on 01/18/2013.  Smoking status noted ROS: Per HPI  Objective: BP 143/90 mmHg  Pulse 69  Temp(Src) 98.2 F (36.8 C) (Oral)  Ht  (1.676 m)  Wt 68.765 kg (151 lb 9.6 oz)  BMI 24.48 kg/m2  LMP 06/25/2014 Gen: NAD, alert, cooperative with exam HEENT: NCAT, EOMI, PERRL CV: RRR Resp: CTABL, no wheezes, non-labored Abd: SNTND, BS present, no guarding or organomegaly Ext: No edema, warm. Pulses present and symmetrical. Decreased range of motion of right hip in flexion, FABER, FADIR. Pain experienced within the right groin during these motions. Neuro: Alert and oriented, No gross deficits  Assessment and plan:  Chronic right hip pain Patient continues to have right-sided hip pain likely secondary to avascular necrosis. Patient reports that she had been diagnosed with avascular necrosis bilaterally using go. Most recent radiographs of left hip appeared to be normal. Right-sided hip radiographs consistent with bony changes of avascular necrosis of the femoral head.  Patient has been treated for this chronic right hip pain for many years.  - At this time I have reordered her prescription for Norco 10/325. She is taking a incredible amount of this medication at this time. I believe that it is imperative that we work to reduce this quantity over  time. - I have written a prescription for meloxicam. I believe patient will benefit from a once daily anti-inflammatory. - I will refer patient to physical therapy. I discussed with her the benefits of strengthening the muscles in her legs in order to better support and stabilize her extremities. Patient was cautious that this could have a chance to cause increased pain. I made it clear with patient that I fully expect physical therapy to be fairly uncomfortable for her considering she has a decreased range of motion in that right hip. However, I fully believe that physical therapy will ultimately be beneficial if patient is able to participate long-term.  Next step: If patient fails meloxicam trial that I believe that it may be necessary to perform a corticosteroid injection of the right hip under ultrasound.    Orders Placed This Encounter  Procedures  . BASIC METABOLIC PANEL WITH GFR  . Ambulatory referral to Physical Therapy    Referral Priority:  Routine    Referral Type:  Physical Medicine    Referral Reason:  Specialty Services Required    Requested Specialty:  Physical Therapy    Number of Visits Requested:  1    Meds ordered this encounter  Medications  . HYDROcodone-acetaminophen (NORCO) 10-325 MG per tablet    Sig: Take 1 tablet by mouth every 6 (six) hours as needed for severe pain.    Dispense:  120 tablet    Refill:  0  . meloxicam (MOBIC) 15 MG tablet    Sig: Take 1 tablet (15 mg total) by mouth daily.  Dispense:  30 tablet    Refill:  2     Kathee Delton, MD,MS,  PGY1 06/27/2014 7:07 PM

## 2014-06-27 NOTE — Patient Instructions (Signed)
It was a pleasure seeing you today in our clinic. Today we discussed hip pain. Here is the treatment plan we have discussed and agreed upon together:   - I have prescribed you Mobic. Please take this one time a day. This is for inflammation. - I have refilled your Norco - I have attempted to set you up to have your ability for obtaining the [facility] Card assessed.

## 2014-06-27 NOTE — Assessment & Plan Note (Signed)
Patient continues to have right-sided hip pain likely secondary to avascular necrosis. Patient reports that she had been diagnosed with avascular necrosis bilaterally using go. Most recent radiographs of left hip appeared to be normal. Right-sided hip radiographs consistent with bony changes of avascular necrosis of the femoral head.  Patient has been treated for this chronic right hip pain for many years.  - At this time I have reordered her prescription for Norco 10/325. She is taking a incredible amount of this medication at this time. I believe that it is imperative that we work to reduce this quantity over time. - I have written a prescription for meloxicam. I believe patient will benefit from a once daily anti-inflammatory. - I will refer patient to physical therapy. I discussed with her the benefits of strengthening the muscles in her legs in order to better support and stabilize her extremities. Patient was cautious that this could have a chance to cause increased pain. I made it clear with patient that I fully expect physical therapy to be fairly uncomfortable for her considering she has a decreased range of motion in that right hip. However, I fully believe that physical therapy will ultimately be beneficial if patient is able to participate long-term.  Next step: If patient fails meloxicam trial that I believe that it may be necessary to perform a corticosteroid injection of the right hip under ultrasound.

## 2014-06-29 ENCOUNTER — Telehealth: Payer: Self-pay | Admitting: Family Medicine

## 2014-06-29 NOTE — Telephone Encounter (Signed)
Pt called and would like someone to call her with the results from her lab visit. jw

## 2014-06-29 NOTE — Telephone Encounter (Signed)
Called patient and informed her of normal lab results, patient expressed understanding.

## 2014-07-27 ENCOUNTER — Telehealth: Payer: Self-pay | Admitting: Family Medicine

## 2014-07-27 NOTE — Telephone Encounter (Signed)
Left message on voicemail for patient to call back to scheduled an appointment.

## 2014-07-27 NOTE — Telephone Encounter (Signed)
Needs refill on pain med Please call when ready for pickup

## 2014-07-27 NOTE — Telephone Encounter (Signed)
Patient upset, states her discharge papers say to follow up in 2 months and if she would have known that she needed an appointment before then she could have scheduled at her last appointment. Patient hesitant to schedule for MD's next available appt because she says she doesn't know what her work scheduled will be. Patient angry and states that the doctors here have horrible communication. Would like MD to call her.

## 2014-07-27 NOTE — Telephone Encounter (Signed)
Patient is going to need to be seen in the clinic for this type of refill. As is standard protocol.

## 2014-07-28 NOTE — Telephone Encounter (Signed)
Called patient. We had some misunderstandings at our previous appointment about refilling medications and scheduling physical therapy. We were able to discuss a plan which she could be seen by me in clinic which is her only day off this week.   I would like to ask the nursing staff to please help schedule this patient at the end of my work day tomorrow--double booking ok.   At this visit I will plan to reassess patient's pain, provide patient w/ information on physical therapy options, and refill her Norco prescription (if deemed appropriate).  Thank you!

## 2014-07-28 NOTE — Telephone Encounter (Signed)
Patient scheduled at end of MD clinic on 07/29/14.

## 2014-07-29 ENCOUNTER — Encounter: Payer: Self-pay | Admitting: Family Medicine

## 2014-07-29 ENCOUNTER — Ambulatory Visit (INDEPENDENT_AMBULATORY_CARE_PROVIDER_SITE_OTHER): Payer: Self-pay | Admitting: Family Medicine

## 2014-07-29 VITALS — BP 147/87 | HR 68 | Temp 97.9°F | Ht 65.0 in | Wt 151.1 lb

## 2014-07-29 DIAGNOSIS — M87051 Idiopathic aseptic necrosis of right femur: Secondary | ICD-10-CM

## 2014-07-29 DIAGNOSIS — Z7189 Other specified counseling: Secondary | ICD-10-CM

## 2014-07-29 DIAGNOSIS — Z113 Encounter for screening for infections with a predominantly sexual mode of transmission: Secondary | ICD-10-CM | POA: Insufficient documentation

## 2014-07-29 DIAGNOSIS — G8929 Other chronic pain: Secondary | ICD-10-CM

## 2014-07-29 MED ORDER — HYDROCODONE-ACETAMINOPHEN 10-325 MG PO TABS: 1.0000 | ORAL_TABLET | Freq: Four times a day (QID) | ORAL | Status: DC | PRN

## 2014-07-29 NOTE — Assessment & Plan Note (Addendum)
Patient continues to have chronic right-sided hip pain secondary to avascular necrosis. AVN secondary to lupus.  No recent changes in strength, sensation. Patient has reduced range of motion specifically of her right hip, this is stable since last as it.  Pain medication as needed. I would like to have patient be referred to physical therapy however she currently has no insurance coverage. I would like to have patient be seen for qualification for a orange card.  Future plans: Strong consideration towards steroid injection into right hip. Patient states that she has not had one of these in over a year.

## 2014-07-29 NOTE — Progress Notes (Signed)
   HPI  Patient presents today for pain medication refill and reassessment.  Patient is a 34 year old female with a history significant for bilateral femoral AVN right greater than left. Patient has significant pain and decreased range of motion of her right hip. She states that her left hip is "back to normal". Patient works for the [employer] which requires a significant amount of walking each day. Today she is asked that I write her employer a letter explaining the purpose of today's visit as well as a little background into her pain/condition in order for them to further understand the necessity of her visits with me.  No recent increase in pain. No complaints of numbness tingling paresthesias weakness dizziness headaches loss of coordination or bowel/bladder incontinence.  Smoking status noted ROS: Per HPI  Objective: BP 147/87 mmHg  Pulse 68  Temp(Src) 97.9 F (36.6 C) (Oral)  Ht 5\' 5"  (1.651 m)  Wt 151 lb 1.6 oz (68.539 kg)  BMI 25.14 kg/m2  LMP 07/28/2014 Gen: NAD, alert, cooperative with exam HEENT: NCAT, EOMI, PERRL CV: RRR, good S1/S2, no murmur Resp: CTABL, no wheezes, non-labored Abd: SNTND, BS present, no guarding or organomegaly Ext: No edema, warm. Pulses present and symmetrical. Decreased range of motion of right hip in flexion, FABER, FADIR. Pain experienced within the right groin during these motions. Strength and sensation intact bilaterally, no deficits with either these. Neuro: Alert and oriented, No gross deficits  Assessment and plan:  AVASCULAR NECROSIS, FEMORAL HEAD Patient continues to have chronic right-sided hip pain secondary to avascular necrosis. AVN secondary to lupus.  No recent changes in strength, sensation. Patient has reduced range of motion specifically of her right hip, this is stable since last as it.  Pain medication as needed. I would like to have patient be referred to physical therapy however she currently has no insurance  coverage. I would like to have patient be seen for qualification for a orange card.  Future plans: Strong consideration towards steroid injection into right hip. Patient states that she has not had one of these in over a year.   Encounter for chronic pain management Prescription for Norco 10/325 provided for patient's chronic right hip pain secondary to AVN caused by SLE.  #[id number] prescriptions  Discussed with her my desire to reduce her need for these types of medications. Patient has been very open minded in our clinic about this.    Meds ordered this encounter  Medications  . DISCONTD: HYDROcodone-acetaminophen (NORCO) 10-325 MG per tablet    Sig: Take 1 tablet by mouth every 6 (six) hours as needed for severe pain.    Dispense:  120 tablet    Refill:  0  . HYDROcodone-acetaminophen (NORCO) 10-325 MG per tablet    Sig: Take 1 tablet by mouth every 6 (six) hours as needed for severe pain.    Dispense:  120 tablet    Refill:  0     Kathee DeltonIan D Dontavis Tschantz, MD,MS,  PGY1 07/29/2014 5:49 PM

## 2014-07-29 NOTE — Patient Instructions (Addendum)
It was a pleasure seeing you today in our clinic. Today we discussed hip pain. Here is the treatment plan we have discussed and agreed upon together:   - I have provided you with a prescription for Norco. Ultimately I would like to reduce the amount of this medication you need and take but for now we will continue this regimen. - I would like to have to get set up for an [employer] soon.  - letter for work has been written

## 2014-07-29 NOTE — Assessment & Plan Note (Signed)
Prescription for Norco 10/325 provided for patient's chronic right hip pain secondary to AVN caused by SLE.  #[id number] prescriptions  Discussed with her my desire to reduce her need for these types of medications. Patient has been very open minded in our clinic about this.

## 2014-09-06 ENCOUNTER — Ambulatory Visit: Payer: Self-pay

## 2014-09-23 ENCOUNTER — Telehealth: Payer: Self-pay | Admitting: Family Medicine

## 2014-09-23 NOTE — Telephone Encounter (Signed)
Requesting refill on hydrocodone.   °

## 2014-09-26 NOTE — Telephone Encounter (Signed)
Checking status, would like this before 5 pm today

## 2014-09-27 NOTE — Telephone Encounter (Signed)
Pt is calling back for the third time to request a refill on her Hydrocodone. She is hoping that she can get this today and this doctor will not be the same as her previous PCP where she was having to call several times even though she is calling two - three days in advance. Please call . jw

## 2014-09-27 NOTE — Telephone Encounter (Signed)
Patient upset, she states she has an appointment scheduled for 4/22. Feels like she should be able to get a refill since she has an appointment scheduled. Would like to speak with PCP.

## 2014-09-27 NOTE — Telephone Encounter (Signed)
Patient was told at her last visit that no refills for narcotics can be done without an appointment. She clearly stated her understanding at that time. I am not sure where this disconnect is occurring as we spent a significant amount of time discussing this at the previous visit as well as on the phone prior to that visit.   Please have patient make an appointment -- if patient has further questions please let me know and I will call her at the end of the day today.  Thank you

## 2014-09-27 NOTE — Telephone Encounter (Signed)
Pt wants to [facility] to someone [facility]

## 2014-09-28 NOTE — Telephone Encounter (Signed)
Called patient today. Was unable to reach her. Left voicemail. On message I informed patient that I would be happy to discuss with her any issues she may have. I asked that she call our office and the staff there would be able to reach me.  Staff: if patient calls back asking for refills on narcotics (Norco) >> our policy states she must have an appointment. She was given 2 prescriptions at the last visit which should have lasted until now >> I do not feel as though foul play is occurring but I believe patient doesn't fully grasp the importance of these policies.  - If patient calls back TODAY (09/28/14) and would like to speak to me please page me and I will call her back today [phone]  - All other scenarios: please leave me a message in my Epic inbox.  Thank you!

## 2014-09-28 NOTE — Telephone Encounter (Signed)
PCP stated he will give patient a call around noon today. Clovis PuMartin, Ananiah Maciolek L, RN

## 2014-09-28 NOTE — Telephone Encounter (Signed)
Called patient. Was able to reach her. Patient frustrated about the current situation she is currently in. States that she is being required to work 6 days a week at her job. This is obviously more than the 2-3 days/wk she reportedly told her supervisor she was willing to work. This extra work is causing her to have a significant amount of additional hip pain due to the inability to have time to recuperate. This pain is causing her to increase her narcotic use to try to control the pain.   I asked her what her expectations were from me at this time. Patient states that she has some FMLA forms she was hoping to ask me to fill out. She also stated she only has a day or 2 of narcotics left and our appt is >2 weeks away.   I informed her that she and I [facility] our [facility] visit. I also let her know I could give her a supplemental narcotic refill tomorrow afternoon to be picked up at the front desk. (According to the prescriptions I provided at our last visit, patient would have run out of medication yesterday (4/5) had she taken them as prescribed.)  I also had a discussion w/ the patient about her work. There is very little I can do about this issue for the patient. I stated I was happy to help her in any way I could as her physician but much of this issue is something she needs to deal without my (or our staff's) involvement. Patient stated her understanding.  At no point throughout our discussion did the patient mention any displeasure with myself, our staff, or our communication. Had I not seen the many documented interactions in this system I would have never known about her disgruntled attitude. I believe this indicates this patient likely requires STRICT DOCUMENTATION in the future for all parties involved with her care. Patient can be very vocal, very inflammatory, and (likely) very manipulative -- thorough documentation can keep all parties accurately aware of the care  plan.   Plan: - give partial narcotic refill and leave at front desk tomorrow at lunch, 4/7 - patient to be seen 4/22 >> go over FMLA forms, reassess pain - strong consideration for steroid hip injection.    Kathee DeltonIan D Anvitha Hutmacher, MD,MS,  PGY1 09/28/2014 3:50 PM

## 2014-09-29 ENCOUNTER — Other Ambulatory Visit: Payer: Self-pay | Admitting: Family Medicine

## 2014-09-29 ENCOUNTER — Telehealth: Payer: Self-pay | Admitting: Family Medicine

## 2014-09-29 MED ORDER — HYDROCODONE-ACETAMINOPHEN 10-325 MG PO TABS: 1.0000 | ORAL_TABLET | Freq: Four times a day (QID) | ORAL | Status: DC | PRN

## 2014-10-13 ENCOUNTER — Ambulatory Visit: Payer: Self-pay | Admitting: Family Medicine

## 2014-10-14 ENCOUNTER — Ambulatory Visit: Payer: Self-pay | Admitting: Family Medicine

## 2014-10-21 ENCOUNTER — Encounter: Payer: Self-pay | Admitting: Family Medicine

## 2014-10-21 ENCOUNTER — Ambulatory Visit (INDEPENDENT_AMBULATORY_CARE_PROVIDER_SITE_OTHER): Payer: Self-pay | Admitting: Family Medicine

## 2014-10-21 VITALS — BP 133/87 | HR 97 | Temp 98.3°F | Ht 65.0 in | Wt 150.0 lb

## 2014-10-21 DIAGNOSIS — G8929 Other chronic pain: Secondary | ICD-10-CM

## 2014-10-21 DIAGNOSIS — M87051 Idiopathic aseptic necrosis of right femur: Secondary | ICD-10-CM

## 2014-10-21 DIAGNOSIS — Z7189 Other specified counseling: Secondary | ICD-10-CM

## 2014-10-21 DIAGNOSIS — N6082 Other benign mammary dysplasias of left breast: Secondary | ICD-10-CM

## 2014-10-21 MED ORDER — HYDROCODONE-ACETAMINOPHEN 10-325 MG PO TABS: 1.0000 | ORAL_TABLET | Freq: Four times a day (QID) | ORAL | Status: DC | PRN

## 2014-10-21 NOTE — Patient Instructions (Addendum)
It was a pleasure seeing you today in our clinic. Today we discussed sebaceous cyst and hip pain. Here is the treatment plan we have discussed and agreed upon together:   - Keep an eye on the cyst you were concerned with today. If it grows, becomes red/swollen/painful then do not hesitate to schedule an appointment for me to remove it.  - Keep staying active! You are doing a great job staying motivated with work. I know it can ware you out but staying active and staying strong is really going to help you long-term. I have refilled your pain medications today.

## 2014-10-21 NOTE — Assessment & Plan Note (Signed)
No evidence or history of trauma or foreign body. Previous I&D of the site makes sebaceous cyst most likely if capsule was not removed at that time. No active infectious process suspected at this time due to lack of PE findings, erythema, drainage, or pain. Patient states it is currently not bothering her but she was wondering why it was back. I explained that if the entire capsule was not removed at the previous I&D then it could regrow. Patient does not desire anything done at this time but says she will consider asking in the future if it continues to grow.

## 2014-10-21 NOTE — Progress Notes (Signed)
   HPI  Patient presents today for medication refill and assessment of a skin lesion  Chronic hip pain, bilateral: unchanged overall. Continues to have good days and bad day. Pain described as sharp and achy. Worse with use and over-use. No radiation. No weakness. No recent history of trauma, infection, swelling, erythema. Continues to walk w/ a limp.  Sebaceous Cyst: inferior Lf sternal border. Patient states [facility], but it has come back. Cyst is about 1x1.5cm. Non-painful w/ some fluctuance present. No drainage. Patient states she's not overly bothered by it but curious of why is was able to come back. Denies fever, chills, HA, vision change, N/V/D, SOB.  Smoking status noted ROS: Per HPI  Objective: BP 133/87 mmHg  Pulse 97  Temp(Src) 98.3 F (36.8 C) (Oral)  Ht 5\' 5"  (1.651 m)  Wt 150 lb (68.04 kg)  BMI 24.96 kg/m2 Gen: NAD, alert, cooperative with exam HEENT: NCAT, EOMI, PERRL CV: RRR, good S1/S2, no murmur Resp: CTABL, no wheezes, non-labored Abd: SNTND, BS present, no guarding or organomegaly Ext: No edema, warm. Pulses present and symmetrical. Decreased range of motion of right hip in flexion, FABER, FADIR. Pain experienced within the right groin during these motions. Strength and sensation intact bilaterally, no deficits with either these. Neuro: Alert and oriented, No gross deficits Skin: 1x1.5cm dermal cyst at Lt inferior sternal border. No surrounding erythema. No pain. No drainage. 1cm scar noted next to this lesion from previous I&D of sebaceous cyst. No central white/black cap.  Assessment and plan:  AVASCULAR NECROSIS, FEMORAL HEAD Patient continues to have chronic right-sided hip pain secondary to avascular necrosis. AVN secondary to lupus. No changes in pain or ROM  Pain medication as needed. (refilled) I would like to have patient be referred to physical therapy however she currently has no insurance coverage.  - I  believe this may be corrected soon as patient stated that she has been working to get set up w/ the [employer].  - Patient states she would like me to fill out some FMLA forms for her next week. When asked about her goals with this paperwork she (unprompted) stated her desire to continue to work fulltime, but continues to have days which she struggles w/ the pain due to "over-doing it" the day before. She estimates 2-3 days per month she feels this way.   Sebaceous cyst of skin of left breast No evidence or history of trauma or foreign body. Previous I&D of the site makes sebaceous cyst most likely if capsule was not removed at that time. No active infectious process suspected at this time due to lack of PE findings, erythema, drainage, or pain. Patient states it is currently not bothering her but she was wondering why it was back. I explained that if the entire capsule was not removed at the previous I&D then it could regrow. Patient does not desire anything done at this time but says she will consider asking in the future if it continues to grow.     Meds ordered this encounter  Medications  . HYDROcodone-acetaminophen (NORCO) 10-325 MG per tablet    Sig: Take 1 tablet by mouth every 6 (six) hours as needed for severe pain.    Dispense:  120 tablet    Refill:  0     Kathee DeltonIan D [name], MD,MS,  PGY1 10/21/2014 6:00 PM

## 2014-10-21 NOTE — Assessment & Plan Note (Signed)
Patient continues to have chronic right-sided hip pain secondary to avascular necrosis. AVN secondary to lupus. No changes in pain or ROM  Pain medication as needed. (refilled) I would like to have patient be referred to physical therapy however she currently has no insurance coverage.  - I believe this may be corrected soon as patient stated that she has been working to get set up w/ the [employer].  - Patient states she would like me to fill out some FMLA forms for her next week. When asked about her goals with this paperwork she (unprompted) stated her desire to continue to work fulltime, but continues to have days which she struggles w/ the pain due to "over-doing it" the day before. She estimates 2-3 days per month she feels this way.

## 2014-10-26 ENCOUNTER — Encounter: Payer: Self-pay | Admitting: Family Medicine

## 2014-10-26 NOTE — Progress Notes (Signed)
Patient dropped off FMLA forms to be filled out.  Please fax when completed. °

## 2014-10-26 NOTE — Progress Notes (Signed)
Placed in MDs box. Angela Walls, CMA  

## 2014-11-07 ENCOUNTER — Ambulatory Visit: Payer: No Typology Code available for payment source

## 2014-11-07 ENCOUNTER — Ambulatory Visit (INDEPENDENT_AMBULATORY_CARE_PROVIDER_SITE_OTHER): Payer: No Typology Code available for payment source | Admitting: Podiatry

## 2014-11-07 ENCOUNTER — Encounter: Payer: Self-pay | Admitting: Podiatry

## 2014-11-07 VITALS — BP 145/87 | HR 71 | Resp 11 | Ht 65.0 in | Wt 150.0 lb

## 2014-11-07 DIAGNOSIS — M2010 Hallux valgus (acquired), unspecified foot: Secondary | ICD-10-CM

## 2014-11-07 NOTE — Progress Notes (Signed)
   Subjective:    Patient ID: Angela Walls, female    DOB: 02/26/1981, 34 y.o.   MRN: [id number]  HPI    Review of Systems  All other systems reviewed and are negative.      Objective:   Physical Exam        Assessment & Plan:

## 2014-11-08 NOTE — Progress Notes (Signed)
Subjective:     Patient ID: Angela Walls, female   DOB: 07/25/1980, 34 y.o.   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 Would like to avoid surgery on this if possible

## 2014-11-09 ENCOUNTER — Ambulatory Visit: Payer: No Typology Code available for payment source | Admitting: Podiatry

## 2014-11-16 ENCOUNTER — Other Ambulatory Visit: Payer: Self-pay | Admitting: Family Medicine

## 2014-11-16 NOTE — Progress Notes (Signed)
Have the forms been faxed. Pt is at her work and they say they havent received them.   Please let pt know

## 2014-11-17 NOTE — Progress Notes (Signed)
They were completed yesterday. Was going to fax them but they specifically state (in the fine print) to return the forms to patient.  They are waiting up front.

## 2014-12-02 ENCOUNTER — Encounter: Payer: Self-pay | Admitting: Family Medicine

## 2014-12-02 ENCOUNTER — Ambulatory Visit (INDEPENDENT_AMBULATORY_CARE_PROVIDER_SITE_OTHER): Payer: Self-pay | Admitting: Family Medicine

## 2014-12-02 VITALS — BP 137/89 | HR 64 | Temp 98.1°F | Ht 65.0 in | Wt 153.5 lb

## 2014-12-02 DIAGNOSIS — N6323 Unspecified lump in the left breast, lower outer quadrant: Secondary | ICD-10-CM | POA: Insufficient documentation

## 2014-12-02 DIAGNOSIS — N63 Unspecified lump in breast: Secondary | ICD-10-CM

## 2014-12-02 DIAGNOSIS — M87051 Idiopathic aseptic necrosis of right femur: Secondary | ICD-10-CM

## 2014-12-02 DIAGNOSIS — N938 Other specified abnormal uterine and vaginal bleeding: Secondary | ICD-10-CM

## 2014-12-02 MED ORDER — HYDROCODONE-ACETAMINOPHEN 10-325 MG PO TABS: 1.0000 | ORAL_TABLET | Freq: Four times a day (QID) | ORAL | Status: DC | PRN

## 2014-12-02 NOTE — Progress Notes (Signed)
   HPI  CC: med refill and irregular menses.   # dysfunctional uterine bleeding:  Patient c/o irregular menses. Been ongoing since December. Has has light menses and sometimes multiple menses in a month. Patient had been on Depo but has been off for almost a year. She has experienced sxs like this when she had DCd Depo use in the past. Not currently sexually active. Bleeding is not constant. Will typically go at least 2 weeks between episodes.  ROS: denies menorrhagia, dysmenorrhea, dysuria, discharge, odor, fatigue, fever, chills, n/v/d/c.  # Breast lump  Located at 4:00 Lt breast. Patient not very concerned at this time but more curious b/c it seems to grow w/ her cycles. No erythema or drainage. No nipple discharge. Patient seems less concerned about this and is not interested in an exam at this time.  Review of Systems   See HPI for ROS. All other systems reviewed and are negative.  Past medical history and social history reviewed and updated in the EMR as appropriate.  Objective: BP 137/89 mmHg  Pulse 64  Temp(Src) 98.1 F (36.7 C) (Oral)  Ht 5\' 5"  (1.651 m)  Wt 153 lb 8 oz (69.627 kg)  BMI 25.54 kg/m2  LMP 11/04/2014 (LMP Unknown) Gen: NAD, alert, cooperative with exam HEENT: NCAT, EOMI, PERRL CV: RRR  Resp: CTABL, no wheezes, non-labored Abd: SNTND, BS present, no guarding or organomegaly Ext: No edema, warm. Pulses present and symmetrical. Decreased ROM. Pain experienced within the right groin during these motions. Strength and sensation intact bilaterally, no deficits with either these. Neuro: Alert and oriented, No gross deficits  Assessment and plan:  Dysfunctional uterine bleeding Patient had been on Depo for >48yrs according to patient. She came off of this <1year ago. Menses started again in December. This DUB is likely due to the longterm Depo. I have asked patient to be patient w/ this and to give it some time. Patient denied recent intercourse so no preg test  warranted today. Will reassess at f/u visit.  Breast lump on left side at 4 o'clock position Patient seemed to be looking for clarification so no exam performed today. She says this lump was worked up before and was negative. Seems to grow w/ her menstrual cycle. Likely a cyst or fibrocystic changes. Will monitor for now. Will likely examine at next visit if continues to present an issue.   AVASCULAR NECROSIS, FEMORAL HEAD Unchanged from prior. No medication changes warranted at this time. We discussed the possibility for PT in near future due to some insurance changes in the future. Also discussed the idea of reducing pain medication total later down the road.     No orders of the defined types were placed in this encounter.    Meds ordered this encounter  Medications  . HYDROcodone-acetaminophen (NORCO) 10-325 MG per tablet    Sig: Take 1 tablet by mouth every 6 (six) hours as needed for severe pain.    Dispense:  120 tablet    Refill:  0     Kathee Delton, MD,MS,  PGY1 12/02/2014 6:15 PM

## 2014-12-02 NOTE — Assessment & Plan Note (Signed)
Patient seemed to be looking for clarification so no exam performed today. She says this lump was worked up before and was negative. Seems to grow w/ her menstrual cycle. Likely a cyst or fibrocystic changes. Will monitor for now. Will likely examine at next visit if continues to present an issue.

## 2014-12-02 NOTE — Assessment & Plan Note (Signed)
Unchanged from prior. No medication changes warranted at this time. We discussed the possibility for PT in near future due to some insurance changes in the future. Also discussed the idea of reducing pain medication total later down the road.

## 2014-12-02 NOTE — Patient Instructions (Signed)
It was a pleasure seeing you today in our clinic. Today we discussed your menstrual cycle, breast lump, and pain. Here is the treatment plan we have discussed and agreed upon together:  - I have refilled your Pain medications. We also discussed the future plans to get you established with a Physical Therapist as well as plans to eventually reduce your pain medication quantity. - If you have any significant changes in your menstrual cycle or breast lump discomfort then I'd like to hear about it. If you have any pressing concerns about these please do not hesitate to call.

## 2014-12-02 NOTE — Assessment & Plan Note (Signed)
Patient had been on Depo for >29yrs according to patient. She came off of this <1year ago. Menses started again in December. This DUB is likely due to the longterm Depo. I have asked patient to be patient w/ this and to give it some time. Patient denied recent intercourse so no preg test warranted today. Will reassess at f/u visit.

## 2014-12-08 ENCOUNTER — Telehealth: Payer: Self-pay | Admitting: Psychology

## 2014-12-08 ENCOUNTER — Telehealth: Payer: Self-pay | Admitting: Family Medicine

## 2014-12-08 NOTE — Telephone Encounter (Signed)
The patient is dropping off FMLA forms to be completed. She needs this done in a timely manner, as she states that the last forms were given back too late. Sadie Reynolds, ASA

## 2014-12-08 NOTE — Telephone Encounter (Signed)
Erroneous encounter

## 2014-12-09 NOTE — Telephone Encounter (Signed)
Did she happen to bring the old paperwork in w/ the new? Those forms are not something that one "does in a timely manner" if done appropriately. They take time, and it would be frustrating to know that that time was wasted just to be asked to do it again.

## 2014-12-09 NOTE — Telephone Encounter (Signed)
Form placed in PCP box 

## 2014-12-12 NOTE — Telephone Encounter (Signed)
Only forms brought in is what was placed in your box. I believe old FMLA forms are scanned under the media tab if you need to review them.

## 2014-12-13 NOTE — Telephone Encounter (Signed)
Please inform patient that the FMLA forms are complete and up front waiting for her. Also, make sure if these are somehow insufficient I will likely not be filling this paperwork out again unless my previous copy is brought back.  Thank you -Melanee Spry

## 2014-12-14 NOTE — Telephone Encounter (Signed)
Patient informed. 

## 2014-12-23 ENCOUNTER — Encounter: Payer: Self-pay | Admitting: Family Medicine

## 2014-12-23 ENCOUNTER — Ambulatory Visit (INDEPENDENT_AMBULATORY_CARE_PROVIDER_SITE_OTHER): Payer: Self-pay | Admitting: Family Medicine

## 2014-12-23 VITALS — BP 124/79 | HR 79 | Temp 98.2°F | Ht 65.0 in | Wt 151.8 lb

## 2014-12-23 DIAGNOSIS — G8929 Other chronic pain: Secondary | ICD-10-CM

## 2014-12-23 DIAGNOSIS — M25551 Pain in right hip: Secondary | ICD-10-CM

## 2014-12-23 MED ORDER — HYDROCODONE-ACETAMINOPHEN 10-325 MG PO TABS: 1.0000 | ORAL_TABLET | Freq: Four times a day (QID) | ORAL | Status: DC | PRN

## 2014-12-23 NOTE — Assessment & Plan Note (Addendum)
Pain is within her normal range today. No change in function. Well controlled on pain regimen. Patient asked about other medications when not wanting to take Norco. - Discussed the ability to substitute a dose of her Norco for an extra strength tylenol; she was advised not to exceed that dosing. She was also advised not to take advil/ibuprofen, or aleve/naproxen while on Mobic.  - Refill for patient's Norco was given.   Also: plan for next visit (in provider sticky note): Patient needs BMP/CBC/UDS; possible I&D of Rt arm lesion.

## 2014-12-23 NOTE — Progress Notes (Signed)
   HPI  CC: leg pain and med refill Patient is here for a pain medication refill. She continues to have the leg and hip pain bilaterally (R>L). Pain is slightly worse from her baseline pain. She says she has been working a bit more and is looking forward to a day off (today). Patient reports good pain control w/ her current regimen. She inquired about what she can take when she still has pain but doesn't feel it warrants Norco.   Patient also has a small lesion on her Rt arm. It is not painful but she would like it taken off. It developed >136mths ago and is unchanged for almost 2 months.   ROS: denies fever, chills, HA, dizziness, SOB, cough, fatigue, weakness, numbness, bowel/bladder incontinence.   Past medical history and social history reviewed and updated in the EMR as appropriate.  Objective: BP 124/79 mmHg  Pulse 79  Temp(Src) 98.2 F (36.8 C) (Oral)  Ht 5\' 5"  (1.651 m)  Wt 151 lb 12.8 oz (68.856 kg)  BMI 25.26 kg/m2  LMP 12/22/2014 (Exact Date) Gen: NAD, alert, cooperative, and pleasant. HEENT: NCAT, EOMI, PERRL CV: RRR, no murmur Resp: CTAB, no wheezes, non-labored Abd: SNTND, BS present, no guarding or organomegaly Ext: No edema, warm. Pulses present and symmetrical. Decreased ROM. Pain experienced within the right groin during these motions. Strength and sensation intact bilaterally, no deficits with either these. Rt UE: 1cm diameter circular slightly raised lesion. Seems to have a purulent center, but has hard/fibrous texture at the cap. No erythema or irritation to surrounding area. Neuro: Alert and oriented, Speech clear, No gross deficits  Assessment and plan:  Chronic right hip pain Pain is within her normal range today. No change in function. Well controlled on pain regimen. Patient asked about other medications when not wanting to take Norco. - Discussed the ability to substitute a dose of her Norco for an extra strength tylenol; she was advised not to exceed that  dosing. She was also advised not to take advil/ibuprofen, or aleve/naproxen while on Mobic.  - Refill for patient's Norco was given.   Also: plan for next visit (in provider sticky note): Patient needs BMP/CBC/UDS; possible I&D of Rt arm lesion.   Meds ordered this encounter  Medications  . HYDROcodone-acetaminophen (NORCO) 10-325 MG per tablet    Sig: Take 1 tablet by mouth every 6 (six) hours as needed for severe pain.    Dispense:  120 tablet    Refill:  0     Kathee DeltonIan D Bao Coreas, MD,MS,  PGY1 12/23/2014 5:30 PM

## 2014-12-23 NOTE — Patient Instructions (Signed)
It was a pleasure seeing you today in our clinic. Today we discussed your pain and work. Here is the treatment plan we have discussed and agreed upon together:   - I think that your leg pain will likely improve over the next few days. - As we discussed in the office. It is ok to substitute a dose of Tylenol for a dose of your Norco; but because you are taking Mobic, you can not take any Ibuprofen or Aleve (Naproxen). -At our next visit I'd like to take some blood tests.

## 2015-01-02 ENCOUNTER — Telehealth: Payer: Self-pay | Admitting: Family Medicine

## 2015-01-02 NOTE — Telephone Encounter (Signed)
Pt called and would like to speak to Dr. [name] about the FMLA papers he filled out. She had some additional questions and also some other paperwork she needs him to fill out. Myriam Jacobsonjw

## 2015-01-04 NOTE — Telephone Encounter (Signed)
Called. Got vmail. Left message. Will attempt another call later this afternoon.

## 2015-01-04 NOTE — Telephone Encounter (Signed)
Discussed patient's concerns w/ patient. She will be bringing in forms for me to fill out tomorrow.

## 2015-01-04 NOTE — Telephone Encounter (Signed)
Pt is calling back because she has not heard from the doctor about her forms and the other questions she has. She said that this is very important. jw

## 2015-01-05 ENCOUNTER — Telehealth: Payer: Self-pay | Admitting: Family Medicine

## 2015-01-05 NOTE — Telephone Encounter (Signed)
Forms completed and faxed ~5:30pm 7/14

## 2015-01-05 NOTE — Telephone Encounter (Signed)
Form placed in PCP box 

## 2015-01-05 NOTE — Telephone Encounter (Signed)
Patient requests PCP to complete, sign and fax FMLA Form today. Please, follow up with Patient.

## 2015-01-06 NOTE — Telephone Encounter (Signed)
Pt is calling back, wanted to make sure those forms were faxed and if so where they were faxed to, says she didn't think there was a fax number on the forms. I could not find the forms in the faxes from yesterday, they are also not in MD box.

## 2015-01-06 NOTE — Telephone Encounter (Signed)
I faxed the forms as stated in the previous comment. I had to call the [employer] for this fax number

## 2015-01-09 NOTE — Telephone Encounter (Signed)
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 It also stated that I did not (and will not) FORBID patient working beyond her normal work hours, but if this was needed they were to OK it w/ her (the patient) first.   I can not forbid the patient from working these extra shifts MEDICALLY -- If she truly was UNABLE to work those shifts then there would be more reason to think she shouldn't be working at all.   By doing what I did I should allow patient to only work extra routes when she feels physically able to do so. If she feels unable to work the extra routes w/o putting herself at risk for sending herself into a flare then she needs to deny the routes (as I stated in the [facility] paperwork).   I have spent hours going over this topic w/ this patient, with filling out the FMLA paperwork (x2), and have met multiple tight time-sensitive deadlines this patient has set. I have completed the task the way I feel is most medically appropriate and do not feel it is necessary to deal w/ this matter any longer -- if she and/or her employer are not satisfied with the result or can not see eye-to-eye on this matter then that is now between them.

## 2015-01-09 NOTE — Telephone Encounter (Signed)
Patient has been informed that MD faxed form on 7/14. Will forward to PCP regarding request for letter (patient states she need letter stating she cannot do any other work after her shift ends nor can she work extra shifts)

## 2015-01-09 NOTE — Telephone Encounter (Signed)
Pt called to see if the doctor had faxed her FMLA forms. She also said that she needed a paper stating that she can not do any other work after her normal shift ends. She also said that the doctor needs to be more specific on the forms and papers. They need exact dates and the reasons for the visit. When the letter stating that she can not work extra shifts is written please fax this to [phone] attention [place]. jw

## 2015-01-11 NOTE — Telephone Encounter (Signed)
Patient has been informed of message from MD and that if she needed a copy of FMLA form to give to direct boss we could provide that but MD would not be writing letter. Patient still doesn't understand why MD will not write letter and requests to speak with MD.

## 2015-01-11 NOTE — Telephone Encounter (Signed)
Message given to patient, she seems confused because she thought Dr. [name] would write the extra letter she needed. Would like to speak to MD or nurse.

## 2015-01-12 NOTE — Telephone Encounter (Signed)
Called patient. Explained that all the material she has asked for is located on the [facility] paperwork which has been faxed to the proper people. Patient may contact our office for a copy of this paperwork in order to give to her employer--this was placed in the "to be scanned" pile after faxing so should be in her file.   I do not want multiple documentation out there for her employers w/ possible differing recs, so I think this is the most appropriate thing to do.

## 2015-01-16 ENCOUNTER — Telehealth: Payer: Self-pay | Admitting: Family Medicine

## 2015-01-16 NOTE — Telephone Encounter (Signed)
Pt states she spoke to PCP on Friday, and she meant to discuss the issues with her hips. She would like to know if she should get an xray done or would like to know what other steps should be taken for it. Please advise at earliest convenience. Thank you, Dorothey Baseman, ASA

## 2015-01-19 ENCOUNTER — Ambulatory Visit: Payer: Self-pay | Admitting: Family Medicine

## 2015-01-23 ENCOUNTER — Ambulatory Visit: Payer: Self-pay | Admitting: Family Medicine

## 2015-01-25 ENCOUNTER — Encounter: Payer: Self-pay | Admitting: Family Medicine

## 2015-01-25 ENCOUNTER — Ambulatory Visit (INDEPENDENT_AMBULATORY_CARE_PROVIDER_SITE_OTHER): Payer: Commercial Managed Care - HMO | Admitting: Family Medicine

## 2015-01-25 VITALS — BP 133/81 | HR 62 | Temp 97.8°F | Ht 65.0 in | Wt 152.0 lb

## 2015-01-25 DIAGNOSIS — G8929 Other chronic pain: Secondary | ICD-10-CM

## 2015-01-25 DIAGNOSIS — M87051 Idiopathic aseptic necrosis of right femur: Secondary | ICD-10-CM | POA: Diagnosis not present

## 2015-01-25 DIAGNOSIS — M25551 Pain in right hip: Secondary | ICD-10-CM | POA: Diagnosis not present

## 2015-01-25 MED ORDER — HYDROCODONE-ACETAMINOPHEN 10-325 MG PO TABS: 1.0000 | ORAL_TABLET | Freq: Four times a day (QID) | ORAL | Status: DC | PRN

## 2015-01-25 NOTE — Patient Instructions (Signed)
Dear Angela Walls, Thank you for coming in to clinic today.  1. Refilled Hydrocodone today for 1 month supply 2. Ordered Right Hip X-ray, you can go to [facility] at anytime today during business hours, no apt needed. - we will call you today or tomorrow with results 3. You may call to discuss referral status if already been placed, otherwise need apt with Dr. Wende Mott  Please schedule a follow-up appointment with Dr. Wende Mott as soon as available to discuss referrals for Chronic R hip pain, Lupus  If you have any other questions or concerns, please feel free to call the clinic to contact me. You may also schedule an earlier appointment if necessary.  However, if your symptoms get significantly worse, please go to the Emergency Department to seek immediate medical attention.  Saralyn Pilar, DO [facility]

## 2015-01-25 NOTE — Assessment & Plan Note (Addendum)
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 Likely etiology for chronic R-hip pain, complication of SLE. - See details under "Chronic Hip Pain" - Repeat R-hip X-ray (last 09/2012) - Refilled Norco - Cont Mobic - F/u may need ortho referral

## 2015-01-25 NOTE — Assessment & Plan Note (Addendum)
Persistent to worsening chronic R-hip pain, likely with known AVN DJD b/l hips from SLE complication. Remains ambulatory, continues to work, no significant change in function, pain controlled on narcotics / mobic. No new red flag symptoms, except subjectively worsening pain. - Last Hip X-rays 09/2012 - Not established with Ortho/Rheum  Plan: 1. Ordered R-hip X-ray [facility]), agree with patient's request for repeat X-ray today 2. Refilled Norco 10/325mg  #120 tabs 0 refills x 1 month supply (last filled 12/23/14) 3. Continue Mobic 4. RTC 2-4 weeks f/u with PCP, discuss referrals. Additionally, will need renew UDS 02/23/2015 and confirm with controlled rx database.

## 2015-01-25 NOTE — Progress Notes (Signed)
   Subjective:    Patient ID: Angela Walls, female    DOB: Nov 15, 1980, 34 y.o.   MRN: [id number]  Patient presents for a same day appointment.  HPI  RIGHT HIP PAIN, CHRONIC / History of AVN R-Femoral Head - Last seen at [facility] for same complaint 12/23/2014 by PCP, continues on chronic narcotics, recently had FMLA paperwork completed due to difficulty with prolonged standing / activity at work - Today patient presents for worsening chronic Right Hip pain. Requesting R hip X-ray. Known history of chronic bilateral hip pain R>L with PMH SLE and known AVN bilateral hips (R worse than L), last imaging with hip X-rays 09/2012 (R with inc degenerative change from AVN, L subtle stable AVN). Describes pain worse with prolonged standing / activity, rest improves pain, radiation to R-knee. Occasionally walks with limp if worsening pain. - No recent ortho / rheum follow-up, previously established but has been several years per report. No prior history of surgery. - Taking Norco 10/325, up to 3-4x daily (every day, not missing days), #120 tabs for 1 month supply - Taking Mobic  daily with relief - No other meds or therapy - Denies fevers/chills, recent illness, joint swelling, redness or rash, numbness, tingling, weakness, fall or injury  I have reviewed and updated the following as appropriate: allergies and current medications  Social Hx: - Never smoker  Review of Systems  See above HPI    Objective:   Physical Exam  BP 133/81 mmHg  Pulse 62  Temp(Src) 97.8 F (36.6 C) (Oral)  Ht  (1.651 m)  Wt 152 lb (68.947 kg)  BMI 25.29 kg/m2  LMP 01/23/2015  Gen - well-appearing, comfortable, cooperative, NAD MSK: - Back: no deformity, non-tender, full active ROM, seated SLR no radiculopathy - Hips: No obvious deformity, rash or swelling. Mild +TTP with R-hip compression compared to Left. Pain with ROM limited testing today - Knees: No deformity. No effusion, erythema, non-tender to palpation.  Structurally intact without instability. Full AROM. Ext - non-tender, no edema, peripheral pulses intact +2 b/l Skin - warm, dry, no rashes Neuro - awake, alert, grossly non-focal, intact muscle strength 5/5 b/l grip, knee flex, ankle dorsiflex, intact distal sensation to light touch, gait normal weightbearing     Assessment & Plan:   See specific A&P problem list for details.

## 2015-01-30 ENCOUNTER — Telehealth: Payer: Self-pay | Admitting: Family Medicine

## 2015-01-30 DIAGNOSIS — G8929 Other chronic pain: Secondary | ICD-10-CM

## 2015-01-30 DIAGNOSIS — M25551 Pain in right hip: Principal | ICD-10-CM

## 2015-01-30 DIAGNOSIS — M8700 Idiopathic aseptic necrosis of unspecified bone: Secondary | ICD-10-CM

## 2015-01-30 NOTE — Telephone Encounter (Signed)
Notified by [facility] nursing staff that [facility] was unable to provide Right Hip X-ray for patient last week when attempted to get done, ordered for acute on chronic worsening Right Hip Pain. They stated order was incorrect for this imaging study. Requested re order by physician for "DG Hip Unilateral with Pelvis 2-3 views, Right". Ordered placed. Discussed with Herbert Seta [phone]). Confirmed correct order.  Saralyn Pilar, DO [facility], PGY-3

## 2015-01-31 ENCOUNTER — Ambulatory Visit
Admission: RE | Admit: 2015-01-31 | Discharge: 2015-01-31 | Disposition: A | Discharge status: Home / Self Care | Payer: Commercial Managed Care - HMO | Source: Ambulatory Visit | Attending: Family Medicine | Admitting: Family Medicine

## 2015-01-31 DIAGNOSIS — M8700 Idiopathic aseptic necrosis of unspecified bone: Secondary | ICD-10-CM

## 2015-01-31 DIAGNOSIS — M25551 Pain in right hip: Principal | ICD-10-CM

## 2015-01-31 DIAGNOSIS — G8929 Other chronic pain: Secondary | ICD-10-CM

## 2015-02-01 ENCOUNTER — Telehealth: Payer: Self-pay | Admitting: Family Medicine

## 2015-02-01 DIAGNOSIS — M87051 Idiopathic aseptic necrosis of right femur: Secondary | ICD-10-CM

## 2015-02-01 NOTE — Telephone Encounter (Signed)
Last OV on 01/25/15 for chronic R-hip pain with h/o AVN, SLE. S/p R-hip x-ray with pelvis on 02/01/15. Results below.  Discussed below results with patient. She admits to previously seeing orthopedics years ago [facility], Dr. Charlett Blake). I advised her that it seems R-hip AVN is persistent to worsening. May need further imaging with MRI and evaluation for possible surgery. Referral placed today 02/01/15 to re-establish with Orthopedics. Advised her to follow-up with PCP Dr. Wende Mott as needed over next few weeks to months on this issue.  8/10 R-hip X-ray FINDINGS: Sclerotic changes noted of the right femoral head. Associated deformity with femoral head collapse noted. These findings are consistent with avascular necrosis. Infection cannot be completely excluded. Subtle sclerotic changes in the left femoral head. Avascular necrosis of the left femoral head cannot be excluded . No fracture. No evidence of dislocation.  IMPRESSION: 1. Severe changes of avascular necrosis right femoral head. 2. Subtle changes of avascular necrosis left femoral head cannot be excluded. MRI can be obtained to further evaluate as needed.  Saralyn Pilar, DO [facility], PGY-3

## 2015-02-02 ENCOUNTER — Emergency Department (HOSPITAL_COMMUNITY)
Admission: EM | Admit: 2015-02-02 | Discharge: 2015-02-02 | Disposition: A | Discharge status: Home / Self Care | Payer: Worker's Compensation | Attending: Emergency Medicine | Admitting: Emergency Medicine

## 2015-02-02 ENCOUNTER — Emergency Department (HOSPITAL_COMMUNITY): Payer: Worker's Compensation

## 2015-02-02 DIAGNOSIS — S46912A Strain of unspecified muscle, fascia and tendon at shoulder and upper arm level, left arm, initial encounter: Secondary | ICD-10-CM | POA: Insufficient documentation

## 2015-02-02 DIAGNOSIS — W208XXA Other cause of strike by thrown, projected or falling object, initial encounter: Secondary | ICD-10-CM | POA: Diagnosis not present

## 2015-02-02 DIAGNOSIS — Z862 Personal history of diseases of the blood and blood-forming organs and certain disorders involving the immune mechanism: Secondary | ICD-10-CM | POA: Insufficient documentation

## 2015-02-02 DIAGNOSIS — Y998 Other external cause status: Secondary | ICD-10-CM | POA: Diagnosis not present

## 2015-02-02 DIAGNOSIS — Y9289 Other specified places as the place of occurrence of the external cause: Secondary | ICD-10-CM | POA: Diagnosis not present

## 2015-02-02 DIAGNOSIS — Y9389 Activity, other specified: Secondary | ICD-10-CM | POA: Diagnosis not present

## 2015-02-02 DIAGNOSIS — Z79899 Other long term (current) drug therapy: Secondary | ICD-10-CM | POA: Diagnosis not present

## 2015-02-02 DIAGNOSIS — I10 Essential (primary) hypertension: Secondary | ICD-10-CM | POA: Insufficient documentation

## 2015-02-02 DIAGNOSIS — S4992XA Unspecified injury of left shoulder and upper arm, initial encounter: Secondary | ICD-10-CM | POA: Diagnosis present

## 2015-02-02 DIAGNOSIS — Z880 Allergy status to penicillin: Secondary | ICD-10-CM | POA: Insufficient documentation

## 2015-02-02 DIAGNOSIS — Z791 Long term (current) use of non-steroidal anti-inflammatories (NSAID): Secondary | ICD-10-CM | POA: Insufficient documentation

## 2015-02-02 MED ORDER — IBUPROFEN 400 MG PO TABS
800.0000 mg | ORAL_TABLET | Freq: Once | ORAL | Status: AC
  Administered 2015-02-02: 800 mg via ORAL
  Filled 2015-02-02: qty 2

## 2015-02-02 MED ORDER — CYCLOBENZAPRINE HCL 10 MG PO TABS: 10.0000 mg | ORAL_TABLET | Freq: Two times a day (BID) | ORAL | Status: DC | PRN

## 2015-02-02 NOTE — Discharge Instructions (Signed)
Continue to take your Mobic and hydrocodone as needed for pain and inflammation. We are giving you a muscle relaxant to take in addition. Do not take it or the narcotic if driving because they will make you sleepy.

## 2015-02-02 NOTE — ED Notes (Signed)
Presents with left shoulder pain that began yesterday while reaching up at work, pain is worse with movement and rated 5/10.

## 2015-02-02 NOTE — ED Provider Notes (Signed)
CSN: [id number]     Arrival date & time 02/02/15  1820 History  This chart was scribed for [facility], NP, working with Melene Plan, DO by Elon Spanner, ED Scribe. This patient was seen in room TR09C/TR09C and the patient's care was started at 6:44 PM.   Chief Complaint  Patient presents with  . Shoulder Injury   The history is provided by the patient. No language interpreter was used.   HPI Comments: Angela Walls is a 34 y.o. female who presents to the Emergency Department complaining of left shoulder pain onset yesterday.  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  The pain worsened significantly today.  Abduction of the left shoulder aggravates the pain.  She has not taken any medication for this complaint.  Patient is right-handed.  She denies numbness/tingling distaly.   Patient taking hydrocodone 10/25 mg for chronic hip pain.   Past Medical History  Diagnosis Date  . Lupus     Followed by Rheum. On chronic Prednisone.  . Hypertension   . Anemia   . Avascular necrosis of femoral head     Bilateral. On chronic narcotics for this.   Past Surgical History  Procedure Laterality Date  . Appendectomy  2001  . Cesarean section  2010   Family History  Problem Relation Age of Onset  . Hypertension Maternal Grandmother   . Hypertension Paternal Grandmother   . Stroke Neg Hx   . Heart disease Neg Hx   . Cancer Neg Hx   . Hypertension Father    Social History  Substance Use Topics  . Smoking status: Never Smoker   . Smokeless tobacco: Never Used  . Alcohol Use: No   OB History    Gravida Para Term Preterm AB TAB SAB Ectopic Multiple Living   Review of Systems  Musculoskeletal: Positive for arthralgias.       Left shoulder pain  All other systems reviewed and are negative.     Allergies  Penicillins  Home Medications   Prior to Admission medications   Medication Sig  Start Date End Date Taking? Authorizing Provider  cholecalciferol (VITAMIN D) 1000 UNITS tablet Take 1,000 Units by mouth daily.    Historical Provider, MD  cyclobenzaprine (FLEXERIL) 10 MG tablet Take 1 tablet (10 mg total) by mouth 2 (two) times daily as needed for muscle spasms. 02/02/15   Kaven Cumbie Orlene Och, NP  HYDROcodone-acetaminophen (NORCO) 10-325 MG per tablet Take 1 tablet by mouth every 6 (six) hours as needed for severe pain. 01/25/15   Smitty Cords, DO  meloxicam (MOBIC) 15 MG tablet Take 1 tablet (15 mg total) by mouth daily. 06/27/14   Kathee Delton, MD  Multiple Vitamin (MULTIVITAMIN WITH MINERALS) TABS tablet Take 1 tablet by mouth daily.    Historical Provider, MD   BP 136/94 mmHg  Pulse 65  Temp(Src) 97.6 F (36.4 C) (Oral)  Resp 14  SpO2 100%  LMP 01/23/2015 Physical Exam  Constitutional: She is oriented to person, place, and time. She appears well-developed and well-nourished. No distress.  HENT:  Head: Normocephalic and atraumatic.  Eyes: Conjunctivae and EOM are normal.  Neck: Neck supple. No tracheal deviation present.  Cardiovascular: Normal rate and regular rhythm.   Pulses:      Radial pulses are 2+ on the right side,  and 2+ on the left side.  Pulmonary/Chest: Effort normal and breath sounds normal. No respiratory distress.  Musculoskeletal: Normal range of motion.  Left shoulder: Posterior shoulder TTP.  FROM of left shoulder but pain to posterior aspect with raising over her head.  Grips are equal.   Neurological: She is alert and oriented to person, place, and time.  Skin: Skin is warm and dry.  Psychiatric: She has a normal mood and affect. Her behavior is normal.  Nursing note and vitals reviewed.   ED Course  Procedures (including critical care time)  DIAGNOSTIC STUDIES: Oxygen Saturation is 99% on RA, normal by my interpretation.    COORDINATION OF CARE:  6:49 PM Discussed treatment plan with patient at bedside.  Patient acknowledges and  agrees with plan.    Labs Review Labs Reviewed - No data to display  Imaging Review Dg Shoulder Left  02/02/2015   CLINICAL DATA:  Left shoulder pain for 2 days. No known injury. Initial encounter.  EXAM: LEFT SHOULDER - 2+ VIEW  COMPARISON:  None.  FINDINGS: No acute bony or joint abnormality is identified. Mild to moderate acromioclavicular osteoarthritis is noted. Tiny accessory ossicle off the lateral aspect of the acromion is noted. Imaged left lung and ribs appear normal.  IMPRESSION: No acute abnormality. Mild to moderate acromioclavicular osteoarthritis.   Electronically Signed   By: Drusilla Kanner M.D.   On: 02/02/2015 19:48     MDM  34 y.o. female with left shoulder pain that occurred after reaching upward yesterday. Stable for d/c without neurovascular compromise. Discussed with the patient and all questioned fully answered. She follow up with ortho or return here if any problems arise. Patient has hydrocodone and Mobic at home for pain. Will add flexeril.   Final diagnoses:  Left shoulder strain, initial encounter   I personally performed the services described in this documentation, which was scribed in my presence. The recorded information has been reviewed and is accurate.    [facility] Orlene Och, NP 02/03/15 0109  Melene Plan, DO 02/03/15 1724

## 2015-03-03 ENCOUNTER — Ambulatory Visit (INDEPENDENT_AMBULATORY_CARE_PROVIDER_SITE_OTHER): Payer: Commercial Managed Care - HMO | Admitting: Family Medicine

## 2015-03-03 ENCOUNTER — Encounter: Payer: Self-pay | Admitting: Family Medicine

## 2015-03-03 VITALS — BP 141/84 | HR 83 | Temp 98.3°F | Ht 65.0 in | Wt 151.0 lb

## 2015-03-03 DIAGNOSIS — M25551 Pain in right hip: Secondary | ICD-10-CM | POA: Diagnosis not present

## 2015-03-03 DIAGNOSIS — N946 Dysmenorrhea, unspecified: Secondary | ICD-10-CM | POA: Diagnosis not present

## 2015-03-03 DIAGNOSIS — M87051 Idiopathic aseptic necrosis of right femur: Secondary | ICD-10-CM | POA: Diagnosis not present

## 2015-03-03 DIAGNOSIS — N938 Other specified abnormal uterine and vaginal bleeding: Secondary | ICD-10-CM | POA: Diagnosis not present

## 2015-03-03 DIAGNOSIS — G8929 Other chronic pain: Secondary | ICD-10-CM

## 2015-03-03 LAB — POCT URINE PREGNANCY: Preg Test, Ur: NEGATIVE

## 2015-03-03 MED ORDER — HYDROCODONE-ACETAMINOPHEN 10-325 MG PO TABS
1.0000 | ORAL_TABLET | Freq: Four times a day (QID) | ORAL | Status: DC | PRN
Start: 2015-03-03 — End: 2015-03-27

## 2015-03-03 MED ORDER — HYDROCODONE-ACETAMINOPHEN 10-325 MG PO TABS: 1.0000 | ORAL_TABLET | Freq: Four times a day (QID) | ORAL | Status: DC | PRN

## 2015-03-03 NOTE — Patient Instructions (Addendum)
It was a pleasure seeing you today in our clinic. Today we discussed your hip pain. Here is the treatment plan we have discussed and agreed upon together:   - I have refilled your pain meds. - I have sent a referral to OBGYN - I have sent a referral to Orthopaedics

## 2015-03-03 NOTE — Progress Notes (Signed)
HPI  CC: hip pain Patient presented today for f/u with her hip pain. Patient also wanted to discuss her FMLA paperwork. She was upset b/c she was under the impression that I had not filled out the paperwork in a way which was similar to how we had discussed during a previous visit (Meaning: she and I went through the FMLA process and I had told her what I thought was appropriate regarding her having some occasional time off). B/c of this we spent most of our time going over the scanned FMLA form on the computer together >> patient seemed much less upset w/ me after this was done.   Patient was also interested in an OBGYN referral, as she would like to discuss some issues with her menstrual cycle with someone. When I informed her that that issue is one that a family physician has some training in she stated that she would feel more comfortable discussion w/ an OBGYN -- she did not go much further into detail.  ROS: denied fevers, chills, SOB, CP, rash, new joint pain, joint swelling.   Past medical history and social history reviewed and updated in the EMR as appropriate.  Objective: BP 141/84 mmHg  Pulse 83  Temp(Src) 98.3 F (36.8 C) (Oral)  Ht  (1.651 m)  Wt 151 lb (68.493 kg)  BMI 25.13 kg/m2 Gen: NAD, alert, cooperative, and pleasant. CV: RRR, no murmur Resp: CTAB, no wheezes, non-labored Ext: No edema, warm, Pain w/ ROM of Rt>Lt hips Neuro: Alert and oriented, Speech clear, No gross deficits  Assessment and plan:  Chronic right hip pain No new issues at this time. Patient's pain is fairly well controlled on current pain regimen. She is now asking about what she can do for pain control from here and she does not like having to take narcotics. She would like information on joint injection for her hips. I discussed this w/ [name]. She encouraged me to refer to ortho rather than interventional radiology due to the underlying cause of her hip pain being AVN. Patient stated  that she is NOT interested in joint replacement at this time, however I believe this may come sooner rather than later. - Pain meds refilled today - Ortho referral  Dysfunctional uterine bleeding Patient continues to have issues w/ her menstrual cycle. She did not seem comfortable discussing much further than her cycles being somewhat irregular in timing and varying in amount of flow. Patient was interested in seeing OBGYN - Referral to OBGYN - Urine pregnancy test: negative.    Orders Placed This Encounter  Procedures  . Ambulatory referral to Orthopedic Surgery    Referral Priority:  Routine    Referral Type:  Surgical    Referral Reason:  Specialty Services Required    Requested Specialty:  Orthopedic Surgery    Number of Visits Requested:  1  . Ambulatory referral to Gynecology    Referral Priority:  Routine    Referral Type:  Consultation    Referral Reason:  Specialty Services Required    Requested Specialty:  Gynecology    Number of Visits Requested:  1  . POCT urine pregnancy    Meds ordered this encounter  Medications  . DISCONTD: HYDROcodone-acetaminophen (NORCO) 10-325 MG per tablet    Sig: Take 1 tablet by mouth every 6 (six) hours as needed for severe pain.    Dispense:  120 tablet    Refill:  0  . HYDROcodone-acetaminophen (NORCO) 10-325 MG per tablet  Sig: Take 1 tablet by mouth every 6 (six) hours as needed for severe pain.    Dispense:  120 tablet    Refill:  0     Kathee Delton, MD,MS,  PGY2 03/05/2015 9:22 PM

## 2015-03-05 NOTE — Assessment & Plan Note (Signed)
Patient continues to have issues w/ her menstrual cycle. She did not seem comfortable discussing much further than her cycles being somewhat irregular in timing and varying in amount of flow. Patient was interested in seeing OBGYN - Referral to OBGYN - Urine pregnancy test: negative.

## 2015-03-05 NOTE — Assessment & Plan Note (Signed)
No new issues at this time. Patient's pain is fairly well controlled on current pain regimen. She is now asking about what she can do for pain control from here and she does not like having to take narcotics. She would like information on joint injection for her hips. I discussed this w/ [name]. She encouraged me to refer to ortho rather than interventional radiology due to the underlying cause of her hip pain being AVN. Patient stated that she is NOT interested in joint replacement at this time, however I believe this may come sooner rather than later. - Pain meds refilled today - Ortho referral

## 2015-03-14 ENCOUNTER — Encounter: Payer: Self-pay | Admitting: Obstetrics & Gynecology

## 2015-03-24 ENCOUNTER — Telehealth: Payer: Self-pay | Admitting: Family Medicine

## 2015-03-24 NOTE — Telephone Encounter (Signed)
Angela Walls appt was cancelled for 10/3 due to post call for provider.  Nothing available week after.  Need refill on her hydrocodone rx.  Please let know when ready for pickup.

## 2015-03-27 ENCOUNTER — Ambulatory Visit: Payer: Commercial Managed Care - HMO | Admitting: Family Medicine

## 2015-03-27 ENCOUNTER — Other Ambulatory Visit: Payer: Self-pay | Admitting: Family Medicine

## 2015-03-27 DIAGNOSIS — G8929 Other chronic pain: Secondary | ICD-10-CM

## 2015-03-27 DIAGNOSIS — M87051 Idiopathic aseptic necrosis of right femur: Secondary | ICD-10-CM

## 2015-03-27 DIAGNOSIS — M25551 Pain in right hip: Principal | ICD-10-CM

## 2015-03-27 MED ORDER — HYDROCODONE-ACETAMINOPHEN 10-325 MG PO TABS: 1.0000 | ORAL_TABLET | Freq: Four times a day (QID) | ORAL | Status: DC | PRN

## 2015-03-27 NOTE — Telephone Encounter (Signed)
Refill left up front for patient

## 2015-03-27 NOTE — Telephone Encounter (Signed)
Patient informed, will call back and schedule appointment after checking schedule.

## 2015-04-05 ENCOUNTER — Encounter: Payer: Commercial Managed Care - HMO | Admitting: Obstetrics & Gynecology

## 2015-04-17 ENCOUNTER — Telehealth: Payer: Self-pay | Admitting: Family Medicine

## 2015-04-17 NOTE — Telephone Encounter (Signed)
Pt would like for provider to contact her. She is upset because the only appointments available for her provider to see her in November are on Tuesdays. She states "He knows I have to work on Tuesdays." Would like to talk to provider about med refill and the cyst on her chest. Dorothey BasemanSadie Reynolds, ASA

## 2015-04-18 NOTE — Telephone Encounter (Signed)
Called patient. Got VM. Asked to call our office w/ a good time to reach patient.

## 2015-04-19 NOTE — Telephone Encounter (Signed)
Pt states that anytime on this Friday 04/21/2015 would work as she is off that day. Please contact pt on this date. Thank you, Dorothey BasemanSadie Reynolds, ASA

## 2015-04-21 ENCOUNTER — Encounter: Payer: Commercial Managed Care - HMO | Admitting: Student

## 2015-04-21 NOTE — Telephone Encounter (Signed)
Called patient. Got voicemail but inbox was full and unable to leave message. Will attempt to contact patient at a later time.

## 2015-04-21 NOTE — Telephone Encounter (Signed)
Pt returned call

## 2015-04-24 ENCOUNTER — Other Ambulatory Visit: Payer: Self-pay | Admitting: Family Medicine

## 2015-04-24 ENCOUNTER — Telehealth: Payer: Self-pay | Admitting: Family Medicine

## 2015-04-24 DIAGNOSIS — N938 Other specified abnormal uterine and vaginal bleeding: Secondary | ICD-10-CM

## 2015-04-24 DIAGNOSIS — M25551 Pain in right hip: Principal | ICD-10-CM

## 2015-04-24 DIAGNOSIS — G8929 Other chronic pain: Secondary | ICD-10-CM

## 2015-04-24 DIAGNOSIS — M87051 Idiopathic aseptic necrosis of right femur: Secondary | ICD-10-CM

## 2015-04-24 MED ORDER — PREDNISONE 10 MG PO TABS
ORAL_TABLET | ORAL | Status: DC
Start: 2015-04-24 — End: 2015-07-14

## 2015-04-24 NOTE — Progress Notes (Signed)
Patient had called with concerns of her continued dysfunctional uterine bleeding. A referral has been sent at our last visit. Unfortunately patient has never been informed of her appointment time/date. Patient then received phone call from the GYN office stating that she had missed her appointment.   Please inform patient of date and time of the scheduled appointment for this referral.   Kathee DeltonIan D [name], MD,MS,  PGY2 04/24/2015 3:47 PM

## 2015-04-24 NOTE — Telephone Encounter (Signed)
Discussed w/ patient over the phone. Will see her for f/u at scheduled appt in Nov.

## 2015-04-24 NOTE — Telephone Encounter (Signed)
Discussed w/ patient. See other telephone encounter as needed.

## 2015-04-24 NOTE — Telephone Encounter (Signed)
Would like to have prenedisone sent to outpt pharmacy. She pulled something in her arm and said dr [name] told her to call if she needed some

## 2015-04-26 ENCOUNTER — Telehealth: Payer: Self-pay | Admitting: Obstetrics and Gynecology

## 2015-04-26 NOTE — Telephone Encounter (Signed)
Called but could not leave a message for patient to call back to schedule a new patient doctor referral.

## 2015-04-27 NOTE — Telephone Encounter (Signed)
Pt says she was given a appt by Dr [name] for Nov 15.  There is no appt for Nov 15 on her chart. She wasn't happy and said " my attitude sucked" She would like to talk to dr [name] and a supervisor

## 2015-04-27 NOTE — Telephone Encounter (Signed)
Pt says she was given an  appt to see dr [name] on nov 15. There is no appt for her on Nov 15. She asked to speak to Dr [name] adn a supervisor because "my attitude sucked"

## 2015-04-28 ENCOUNTER — Telehealth: Payer: Self-pay | Admitting: Family Medicine

## 2015-04-28 ENCOUNTER — Encounter: Payer: Self-pay | Admitting: Internal Medicine

## 2015-04-28 ENCOUNTER — Ambulatory Visit (INDEPENDENT_AMBULATORY_CARE_PROVIDER_SITE_OTHER): Payer: Commercial Managed Care - HMO | Admitting: Internal Medicine

## 2015-04-28 ENCOUNTER — Other Ambulatory Visit: Payer: Self-pay | Admitting: Family Medicine

## 2015-04-28 VITALS — BP 122/84 | HR 68 | Temp 98.3°F | Ht 65.0 in | Wt 146.4 lb

## 2015-04-28 DIAGNOSIS — G8929 Other chronic pain: Secondary | ICD-10-CM

## 2015-04-28 DIAGNOSIS — R630 Anorexia: Secondary | ICD-10-CM

## 2015-04-28 DIAGNOSIS — L729 Follicular cyst of the skin and subcutaneous tissue, unspecified: Secondary | ICD-10-CM | POA: Diagnosis not present

## 2015-04-28 DIAGNOSIS — M25551 Pain in right hip: Principal | ICD-10-CM

## 2015-04-28 DIAGNOSIS — L720 Epidermal cyst: Secondary | ICD-10-CM

## 2015-04-28 DIAGNOSIS — N6082 Other benign mammary dysplasias of left breast: Secondary | ICD-10-CM

## 2015-04-28 DIAGNOSIS — N938 Other specified abnormal uterine and vaginal bleeding: Secondary | ICD-10-CM

## 2015-04-28 DIAGNOSIS — M87051 Idiopathic aseptic necrosis of right femur: Secondary | ICD-10-CM

## 2015-04-28 LAB — COMPREHENSIVE METABOLIC PANEL
ALT: 7 U/L (ref 6–29)
AST: 16 U/L (ref 10–30)
Albumin: 4 g/dL (ref 3.6–5.1)
Alkaline Phosphatase: 39 U/L (ref 33–115)
BUN: 12 mg/dL (ref 7–25)
CO2: 25 mmol/L (ref 20–31)
Calcium: 9 mg/dL (ref 8.6–10.2)
Chloride: 106 mmol/L (ref 98–110)
Creat: 0.84 mg/dL (ref 0.50–1.10)
Glucose, Bld: 78 mg/dL (ref 65–99)
Potassium: 3.6 mmol/L (ref 3.5–5.3)
Sodium: 141 mmol/L (ref 135–146)
Total Bilirubin: 0.7 mg/dL (ref 0.2–1.2)
Total Protein: 6.6 g/dL (ref 6.1–8.1)

## 2015-04-28 LAB — CBC WITH DIFFERENTIAL/PLATELET
Basophils Absolute: 0 10*3/uL (ref 0.0–0.1)
Basophils Relative: 0 % (ref 0–1)
Eosinophils Absolute: 0.1 10*3/uL (ref 0.0–0.7)
Eosinophils Relative: 2 % (ref 0–5)
HCT: 34.4 % — ABNORMAL LOW (ref 36.0–46.0)
Hemoglobin: 11.4 g/dL — ABNORMAL LOW (ref 12.0–15.0)
Lymphocytes Relative: 32 % (ref 12–46)
Lymphs Abs: 2.2 10*3/uL (ref 0.7–4.0)
MCH: 28.8 pg (ref 26.0–34.0)
MCHC: 33.1 g/dL (ref 30.0–36.0)
MCV: 86.9 fL (ref 78.0–100.0)
MPV: 10.6 fL (ref 8.6–12.4)
Monocytes Absolute: 0.5 10*3/uL (ref 0.1–1.0)
Monocytes Relative: 7 % (ref 3–12)
Neutro Abs: 4 10*3/uL (ref 1.7–7.7)
Neutrophils Relative %: 59 % (ref 43–77)
Platelets: 347 10*3/uL (ref 150–400)
RBC: 3.96 MIL/uL (ref 3.87–5.11)
RDW: 13.7 % (ref 11.5–15.5)
WBC: 6.8 10*3/uL (ref 4.0–10.5)

## 2015-04-28 LAB — TSH: TSH: 0.469 u[IU]/mL (ref 0.350–4.500)

## 2015-04-28 MED ORDER — HYDROCODONE-ACETAMINOPHEN 10-325 MG PO TABS: 1.0000 | ORAL_TABLET | Freq: Four times a day (QID) | ORAL | Status: DC | PRN

## 2015-04-28 NOTE — Assessment & Plan Note (Signed)
Stable.  - Patient to see GYN for evaluation.

## 2015-04-28 NOTE — Telephone Encounter (Signed)
Returned call to patient.  She was told she had an appt on 05/09/15 with Dr. [name].  Not sure what happened, but appt not present on Dr. Alben SpittleMcKeag's schedule for that day.  Did apologize to patient and she verbalized understanding.  Will need refill on hydrocodone before her follow-up appt.  Last refill was 03/27/15, and she will be out of med over the weekend.  Will send note to Dr. [name] and call patient when Rx is ready to be picked up.  Altamese Dilling~Damar Petit, BSN, RN-BC

## 2015-04-28 NOTE — Patient Instructions (Signed)
It was nice meeting you today, Ms. Angela Walls.   To determine the cause of your decreased appetite, we are going to obtain blood work today. Dr. [name] will go over these test results at your next appointment with him. If there are any urgent abnormalities, we will call you.  I have also placed a referral to general surgery for you to have the cyst examined. They will call you with the date and time of your appointment.   If you have any questions, please feel free to call.  Be well,  Dr. Natale MilchLancaster

## 2015-04-28 NOTE — Telephone Encounter (Signed)
Pt is here for an appt. She states that she was under the impression that she had an appointment on 05/09/2015 with her PCP, however, there must have been some sort of discrepancy as there was no such appt scheduled. Pt ensure me that she confirmed this on the phone with her provider and now she seems a bit frustrated.

## 2015-04-28 NOTE — Progress Notes (Signed)
Subjective:    Patient ID: Angela Walls, female    DOB: 09/11/1980, 34 y.o.   MRN: [id number]  HPI  Angela Walls is a 34 yo F with PMH of SLE and AVN presenting for decreased appetite and a cyst.   Decreased appetite Patient reports that at least for the past month she has had decreased appetite. She says the problem is not that she feels full soon after eating, but that she is rarely hungry. She is usually at least slightly hungry in the evening and eats dinner most days. She rarely eats breakfast or lunch. She drinks an estimated two quarts of water today. She does not drink coffee and drinks soft drinks rarely. She occasionally snacks on crackers during the day. Denies N/V/C/D. Of note, she has lost 5 pounds since her last appointment 2 months ago. In the year and a half prior, she only lost 1 pound.   Cyst - breast and arm Patient reports a small cyst overlying her sternum by her L breast. It has been present for about a year, when it appeared suddenly. It was drained at [facility] shortly after it appeared, but it has since returned. She reports that it is now painful because her bra rubs against it. She denies drainage or bleeding. She has another cyst on her R arm, but she is not currently concerned about this.   Dysfunctional uterine bleeding Patient reports that she has recently had very irregular menstrual periods, sometimes occurring twice a month. She was referred to Angela Walls for this, but says she was not aware of her appointment date and missed it. At her last appointment with Angela Walls, he placed another Gyn referral. The patient says she has an appointment scheduled that she will attend. She does not want to discuss this issue with me today.   Review of Systems See HPI    Objective:   Physical Exam  Constitutional: She is oriented to person, place, and time. She appears well-developed and well-nourished. No distress.  HENT:  Head: Normocephalic and atraumatic.  Cardiovascular:  Normal rate, regular rhythm and normal heart sounds.   No murmur heard. Pulmonary/Chest: She exhibits no tenderness.  Neurological: She is alert and oriented to person, place, and time.  Skin: Skin is warm and dry. She is not diaphoretic.  0.5 cm x 0.5 cm non-tender soft subcutaneous cyst on R upper arm; 1.5 cm x 1.5 cm non-tender indurated non-erythematous mass on sternum by L breast  Psychiatric: She has a normal mood and affect. Her behavior is normal.  Vitals reviewed.     Assessment & Plan:  Sebaceous cyst of skin of left breast Has now become painful, and patient would like drainage or removal of the cyst at this time. As cyst is indurated rather than fluctuant, unsure of utility of attempting I&D at this time.  - Referral to general surgery for evaluation and possible removal  Dysfunctional uterine bleeding Stable.  - Patient to see GYN for evaluation.   Epidermal cyst On R arm. Stable. Patient does not desire removal at this time.   Decreased appetite Worsening. Patient has lost 5 pounds since last visit two months ago. Could be due to depression or a mood disorder, however patient is reluctant to discuss current stressors/lifestyle so cannot investigate further. Hypothyroidism is possible, however patient has had multiple TSH measurements WNL in the past, so unlikely that this would present now. Will still recheck however to be thorough.  - TSH, CBC, CMP today -  Will reassess at follow-up visit    Angela Abernethy, MD PGY-1 [name]

## 2015-04-28 NOTE — Telephone Encounter (Signed)
Pt is here for an appt. She states that she was under the impression that she had an appointment on 05/09/2015 with her PCP, however, there must have been some sort of discrepancy as there was no such appt scheduled. Pt ensures me that she confirmed this on the phone with her provider and now she seems a bit frustrated as there was no appointment scheduled for the date that the pt insisted. I went ahead and scheduled the patient at the first available appointment time which was on 05/23/2015. Additionally, Ms. Arlana Pouchate would like to request a refill on hydrocodone. Please follow up with patient at the earliest convenience. Thank you, Dorothey BasemanSadie Reynolds, ASA

## 2015-04-28 NOTE — Telephone Encounter (Signed)
Called patient but could not leave a message for patient to call back to schedule a new patient doctor referral. Her phone message said, "Mailbox is full."

## 2015-04-28 NOTE — Telephone Encounter (Signed)
Refilled pain meds. Left up front at clinic. Tried calling patient x2. No response.   Will attempt to call Sunday if I am unable to talk w/ her prior. As there are some concerns I have w/ some of her med refill history.

## 2015-04-28 NOTE — Assessment & Plan Note (Signed)
Worsening. Patient has lost 5 pounds since last visit two months ago. Could be due to depression or a mood disorder, however patient is reluctant to discuss current stressors/lifestyle so cannot investigate further. Hypothyroidism is possible, however patient has had multiple TSH measurements WNL in the past, so unlikely that this would present now. Will still recheck however to be thorough.  - TSH, CBC, CMP today - Will reassess at follow-up visit

## 2015-04-28 NOTE — Assessment & Plan Note (Signed)
On R arm. Stable. Patient does not desire removal at this time.

## 2015-04-28 NOTE — Telephone Encounter (Signed)
Returned call to patient.  Rx ready for pick up at front desk.  Informed patient that Dr. [name] attempted to call her x 2 and no response.  Will attempt to call her again on Sunday.  Phone # [phone].  [name], BSN, RN-BC

## 2015-04-28 NOTE — Assessment & Plan Note (Signed)
Has now become painful, and patient would like drainage or removal of the cyst at this time. As cyst is indurated rather than fluctuant, unsure of utility of attempting I&D at this time.  - Referral to general surgery for evaluation and possible removal

## 2015-05-01 NOTE — Telephone Encounter (Signed)
Called but could not leave a message for patient to call back to schedule a new patient doctor referral. The phone message was for a cleaning service.

## 2015-05-02 NOTE — Telephone Encounter (Signed)
Referral routed back to referring office as this patient has not returned several calls to schedule an appointment.

## 2015-05-17 ENCOUNTER — Telehealth: Payer: Self-pay | Admitting: Family Medicine

## 2015-05-17 NOTE — Telephone Encounter (Signed)
Pt missed her appt 05-12-15 with Dr Gwinda Passesuie

## 2015-05-23 ENCOUNTER — Ambulatory Visit (INDEPENDENT_AMBULATORY_CARE_PROVIDER_SITE_OTHER): Payer: Commercial Managed Care - HMO | Admitting: Family Medicine

## 2015-05-23 ENCOUNTER — Encounter: Payer: Self-pay | Admitting: Family Medicine

## 2015-05-23 VITALS — BP 145/91 | HR 67 | Temp 98.4°F | Wt 149.6 lb

## 2015-05-23 DIAGNOSIS — G8929 Other chronic pain: Secondary | ICD-10-CM

## 2015-05-23 DIAGNOSIS — M25551 Pain in right hip: Secondary | ICD-10-CM

## 2015-05-23 DIAGNOSIS — M87051 Idiopathic aseptic necrosis of right femur: Secondary | ICD-10-CM

## 2015-05-23 DIAGNOSIS — N938 Other specified abnormal uterine and vaginal bleeding: Secondary | ICD-10-CM

## 2015-05-23 MED ORDER — CYCLOBENZAPRINE HCL 10 MG PO TABS: 10.0000 mg | ORAL_TABLET | Freq: Two times a day (BID) | ORAL | Status: DC | PRN

## 2015-05-23 MED ORDER — NAPROXEN SODIUM 220 MG PO TABS: 220.0000 mg | ORAL_TABLET | Freq: Two times a day (BID) | ORAL | Status: DC

## 2015-05-23 MED ORDER — HYDROCODONE-ACETAMINOPHEN 10-325 MG PO TABS: 1.0000 | ORAL_TABLET | Freq: Four times a day (QID) | ORAL | Status: DC | PRN

## 2015-05-23 NOTE — Progress Notes (Signed)
   HPI  CC: Pain management Patient is here for follow-up and medication refill for her pain management. She states that her pain has been fairly well controlled with some occasional "bad days". She also reports some "good days" in which she sometimes feels as though she hasn't noticed the pain in her leg all that day. On those days the pain is only present after she gets home from work. She likes the current regimen that we have her on.   Patient endorses continued dysfunctional uterine bleeding in which she has persistent light bleeding between each menstrual period. There is been much confusion in the referral process and patient would like one more referral to OB/GYN for this issue.  ROS: Patient endorses fatigue, hip pain, vaginal bleeding, and decreased appetite. Patient denies fever, chills, nausea, vomiting, diarrhea, confusion, dizziness, chest pain, shortness of breath, dysuria, significant weight gain/loss  Past medical history and social history reviewed and updated in the EMR as appropriate.  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 Pain appears to be well controlled at this time. Patient still taking occasional meloxicam for inflammation. I think it is appropriate to diminish the effect these medications have on her kidneys.  - Discontinued meloxicam; replaced with over-the-counter Aleve only as needed. - Refilled hydrocodone and Flexeril  Dysfunctional uterine bleeding Another referral is been placed to gynecology. I have asked that an additional note be provided that provides patients cell phone number as it appears that their  clinic has had a difficult time contacting this patient, and most of the time I have attempted to use her cell phone number  to get in contact with her, I have been able to reach her. - Referral to gynecology has been placed.     Orders Placed This Encounter  Procedures  . Ambulatory referral to Gynecology    Referral Priority:  Routine    Referral Type:  Consultation    Referral Reason:  Specialty Services Required    Requested Specialty:  Gynecology    Number of Visits Requested:  1    Meds ordered this encounter  Medications  . HYDROcodone-acetaminophen (NORCO) 10-325 MG tablet    Sig: Take 1 tablet by mouth every 6 (six) hours as needed for severe pain.    Dispense:  120 tablet    Refill:  0  . cyclobenzaprine (FLEXERIL) 10 MG tablet    Sig: Take 1 tablet (10 mg total) by mouth 2 (two) times daily as needed for muscle spasms.    Dispense:  30 tablet    Refill:  1  . naproxen sodium (ALEVE) 220 MG tablet    Sig: Take 1 tablet (220 mg total) by mouth 2 (two) times daily with a meal.    Dispense:  30 tablet    Refill:  0     Kathee DeltonIan D [name], MD,MS,  PGY2 05/23/2015 6:18 PM

## 2015-05-23 NOTE — Assessment & Plan Note (Signed)
Another referral is been placed to gynecology. I have asked that an additional note be provided that provides patients cell phone number as it appears that their clinic has had a difficult time contacting this patient, and most of the time I have attempted to use her cell phone number  to get in contact with her, I have been able to reach her. - Referral to gynecology has been placed.

## 2015-05-23 NOTE — Assessment & Plan Note (Signed)
Stable. Pain appears to be well controlled at this time. Patient still taking occasional meloxicam for inflammation. I think it is appropriate to diminish the effect these medications have on her kidneys.  - Discontinued meloxicam; replaced with over-the-counter Aleve only as needed. - Refilled hydrocodone and Flexeril

## 2015-05-23 NOTE — Patient Instructions (Signed)
It was a pleasure seeing you today in our clinic. Today we discussed your pain and referrals. Here is the treatment plan we have discussed and agreed upon together:   - I have refilled your meds. - I have changed your meloxicam to Aleve.

## 2015-05-31 ENCOUNTER — Other Ambulatory Visit: Payer: Self-pay | Admitting: Family Medicine

## 2015-05-31 DIAGNOSIS — G8929 Other chronic pain: Secondary | ICD-10-CM

## 2015-05-31 DIAGNOSIS — M87051 Idiopathic aseptic necrosis of right femur: Secondary | ICD-10-CM

## 2015-05-31 DIAGNOSIS — M25551 Pain in right hip: Principal | ICD-10-CM

## 2015-05-31 NOTE — Telephone Encounter (Signed)
Pt is calling and states that she misplaced her rx for hydrocodone and flexeril. She needs a replacement at the earliest convenience. She also states that her permanent pharmacy has changed to [facility] ([facility] hour) on [address]. Please make the appropriate changes at the earliest convenience. Thank you, Dorothey BasemanSadie Reynolds, ASA

## 2015-06-01 ENCOUNTER — Ambulatory Visit: Payer: Commercial Managed Care - HMO | Admitting: Women's Health

## 2015-06-02 NOTE — Telephone Encounter (Signed)
Patient verified DOB Patient has called again to request Hydrocodone and Flexeril prescription to be sent to [facility] on [address]. Patient requesting a letter for time [place] per conversation Dr and patient had on the 05/23/15. Please follow-up with the patient once prescriptions have been sent over and letter is available for pickup.

## 2015-06-05 NOTE — Telephone Encounter (Signed)
I will call patient after clinic today. - Note for work is ok (however we will discuss whether it is appropriate since I have filled out the FMLA form) - Flexeril can be resent - We will discuss the Norco at greater length

## 2015-06-05 NOTE — Telephone Encounter (Signed)
Called patient to discuss refills. Call went to voicemail. Left message asking her to call tomorrow to inform me when she would be available to discuss these issues.

## 2015-06-06 MED ORDER — CYCLOBENZAPRINE HCL 10 MG PO TABS: 10.0000 mg | ORAL_TABLET | Freq: Two times a day (BID) | ORAL | Status: DC | PRN

## 2015-06-06 NOTE — Telephone Encounter (Signed)
Pt is returning the doctor's call. She works for the [employer] and get's home late. [place]. She asked the doctor to try again or leave a detailed message on her phone or in a message that one of his nurses can relay. jw

## 2015-06-06 NOTE — Telephone Encounter (Signed)
Called patient about refills. Did not reach her but was given permission for message to be left.  Flexeril refilled.  Norco will not be refilled: this is the second time patient has "lost" her norco prescription in the past 3-4 months. A medication like this takes a certain amount of responsibility. If patient is not taking this appropriately OR if she is not storing the bottle safely then she is definitely not exhibiting responsible behavior necessary for this medication. This decision is ABSOLUTELY final.  (The following information was not discussed on the telephone message) - I will consider this event as my final warning. I WILL continue to prescribe this patient Norco at the same schedule we have had. However, if this type of behavior/event occurs again, she will no longer be receiving controlled prescriptions from this provider.  - Patient has been known to demand a change in provider during times of not getting what she wants. If this occurs and she truly feels as though our patient-physician relationship has soured, then I will let our administration decide on that matter.   Kathee DeltonIan D [name], MD,MS,  PGY2 06/06/2015 4:40 PM

## 2015-06-07 ENCOUNTER — Encounter: Payer: Self-pay | Admitting: Family Medicine

## 2015-06-16 ENCOUNTER — Ambulatory Visit: Payer: Commercial Managed Care - HMO | Admitting: Family Medicine

## 2015-06-20 ENCOUNTER — Other Ambulatory Visit: Payer: Self-pay | Admitting: Family Medicine

## 2015-06-20 ENCOUNTER — Telehealth: Payer: Self-pay | Admitting: Family Medicine

## 2015-06-20 DIAGNOSIS — G8929 Other chronic pain: Secondary | ICD-10-CM

## 2015-06-20 DIAGNOSIS — M87051 Idiopathic aseptic necrosis of right femur: Secondary | ICD-10-CM

## 2015-06-20 DIAGNOSIS — M25551 Pain in right hip: Principal | ICD-10-CM

## 2015-06-20 MED ORDER — HYDROCODONE-ACETAMINOPHEN 10-325 MG PO TABS: 1.0000 | ORAL_TABLET | Freq: Four times a day (QID) | ORAL | Status: DC | PRN

## 2015-06-20 NOTE — Telephone Encounter (Signed)
Prescription left up front. (patient is now one month from previous script and has a clear source for pain)  Note enclosed w/ prescription informing patient that she will need to do better about keeping future appointments. Also a statement regarding hostile/inflammatory comments to the staff and how not only is this not appropriate, but it is also not tolerated by this provider.

## 2015-06-20 NOTE — Telephone Encounter (Signed)
Pt called bc she couldn't make last appt with Dr. [name]. I spoke with Dr. [name] and we had an option to get her in and be seen by 330, however, the pt states that she won't be able to make it. Pt states verbatim, "Dr. [name] knows my situation, and the only reason I need this medication is because I misplaced my last prescription. You can send him a message but we have had problems before where the message wasn't sent. I don't want to get ugly and I will be there by 5pm.". Sending to [employer] as well as Team Lead because of possible hostile situation. Sadie Reynolds, ASA

## 2015-06-28 ENCOUNTER — Telehealth: Payer: Self-pay | Admitting: Family Medicine

## 2015-06-28 NOTE — Telephone Encounter (Signed)
Pt called and rescheduled her appointment to 07/14/15, she was a little upset that Sadie thought she was being rude and hostile toward her. She said she wasn't and doesn't want the doctor to think that she was.jw

## 2015-06-30 ENCOUNTER — Ambulatory Visit: Payer: Self-pay | Admitting: Family Medicine

## 2015-06-30 NOTE — Telephone Encounter (Signed)
Ok thank you for the update.

## 2015-07-14 ENCOUNTER — Ambulatory Visit (INDEPENDENT_AMBULATORY_CARE_PROVIDER_SITE_OTHER): Payer: Commercial Managed Care - HMO | Admitting: Family Medicine

## 2015-07-14 ENCOUNTER — Encounter: Payer: Self-pay | Admitting: Family Medicine

## 2015-07-14 VITALS — BP 155/90 | HR 84 | Temp 98.1°F | Ht 65.0 in | Wt 147.5 lb

## 2015-07-14 DIAGNOSIS — G8929 Other chronic pain: Secondary | ICD-10-CM

## 2015-07-14 DIAGNOSIS — M25551 Pain in right hip: Secondary | ICD-10-CM | POA: Diagnosis not present

## 2015-07-14 DIAGNOSIS — M87051 Idiopathic aseptic necrosis of right femur: Secondary | ICD-10-CM

## 2015-07-14 DIAGNOSIS — Z79899 Other long term (current) drug therapy: Secondary | ICD-10-CM | POA: Diagnosis not present

## 2015-07-14 DIAGNOSIS — I10 Essential (primary) hypertension: Secondary | ICD-10-CM

## 2015-07-14 MED ORDER — HYDROCODONE-ACETAMINOPHEN 10-325 MG PO TABS
1.0000 | ORAL_TABLET | Freq: Four times a day (QID) | ORAL | Status: DC | PRN
Start: 2015-07-14 — End: 2015-07-14

## 2015-07-14 MED ORDER — HYDROCODONE-ACETAMINOPHEN 10-325 MG PO TABS: 1.0000 | ORAL_TABLET | Freq: Four times a day (QID) | ORAL | Status: DC | PRN

## 2015-07-14 NOTE — Progress Notes (Signed)
HPI  CC: Medication follow-up Patient is here for medication follow-up. She states that her pain has been relatively well-controlled. She does endorse motivation to reduce her daily narcotic intake. She states that she has noticed some undesired decreased motivation after taking this medication and would like to begin attempts at reducing her need for these. She also states that she has been working a significant amount of work hours recently, secondary to the holidays. Prior to only recently she had not had any days off since October. She states that her work schedule should be going back to normal serum and that she would like to try physical therapy for her hip pain.  She denies any new symptoms. She continues to note regular irritability and agitation. She continues to have irregular menses which a GYN referral has artery been placed. No other issues at this time.   ROS: Denies fever, chills, headache, vision changes, dizziness, syncope, chest pain, shortness of breath, body aches, nausea, vomiting, diarrhea, constipation, dysuria, weakness, or numbness.  Past medical history and social history reviewed and updated in the EMR as appropriate.  Objective: BP 155/90 mmHg  Pulse 84  Temp(Src) 98.1 F (36.7 C) (Oral)  Ht  (1.651 m)  Wt 147 lb 8 oz (66.906 kg)  BMI 24.55 kg/m2  LMP 07/12/2015 Gen: NAD, alert, cooperative, and pleasant. CV: RRR, no murmur Resp: CTAB, no wheezes, non-labored Ext: No edema, warm, pain with ROM of bilateral hips (R>L), decreased ROM of right hip (all at baseline) Neuro: Alert and oriented, Speech clear, No gross deficits  Assessment and plan:  Chronic right hip pain Stable:  Patient's pain is at baseline. No significant issues or flares reported by the patient. Pain is well controlled with narcotic medication. Patient is endorsing some desire to decrease her overall narcotic use. We were able to discuss some techniques to accomplish this. 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 Patient is asking for referral to physical therapy. - Referral to physical therapy placed.  HYPERTENSION, BENIGN SYSTEMIC Stable/worsening: Patient noted to have systolic blood pressure of 155. Patient states that she was upset when she initially was brought into her exam room. Patient's medical history is noted to have hypertension listed in 2008. She has not had any treatment for this condition for "many years". I will follow-up on this at her next visit, if she persists within the hypertensive range I will likely initiate medical therapy. I discussed this with the patient and she was understanding of this possibility, I also stated that if she is been taking over-the-counter NSAIDs this could also contribute to some of her hypertension.    Orders Placed This Encounter  Procedures  . Drug Screen, Urine    [phone] and [id number]  . Ambulatory referral to Physical Therapy    Referral Priority:  Routine    Referral Type:  Physical Medicine    Referral Reason:  Specialty Services Required    Requested Specialty:  Physical Therapy    Number of Visits Requested:  1    Meds ordered this encounter  Medications  . DISCONTD: HYDROcodone-acetaminophen (NORCO) 10-325 MG tablet    Sig: Take 1 tablet by mouth every 6 (six) hours as needed for severe pain.    Dispense:  120 tablet    Refill:  0    Do not fill until 07/21/15  .  HYDROcodone-acetaminophen (NORCO) 10-325 MG tablet    Sig: Take 1 tablet by mouth every 6 (six) hours as needed for severe pain.    Dispense:  120 tablet    Refill:  0    Do not fill until 08/21/15     Kathee Delton, MD,MS,  PGY2 07/14/2015 10:52 AM

## 2015-07-14 NOTE — Patient Instructions (Addendum)
It was a pleasure seeing you today in our clinic. Today we discussed your pain and physical therapy. Here is the treatment plan we have discussed and agreed upon together:   - Today I was able to place a referral for physical therapy. I believe that this will be very beneficial for your long-term control of the hip pain you experience on a daily basis. - I refilled her medications today. Please do not try to refill these until after 07/21/15. - Please contact the GYN and surgery offices to set up an appointment. - Feel free to contact our office with any questions or comments you have.

## 2015-07-14 NOTE — Assessment & Plan Note (Signed)
Stable: Patient continues to have bilateral hip pain, right greater than left. Patient is asking for referral to physical therapy. - Referral to physical therapy placed.

## 2015-07-14 NOTE — Assessment & Plan Note (Addendum)
Stable/worsening: Patient noted to have systolic blood pressure of 155. Patient states that she was upset when she initially was brought into her exam room. Patient's medical history is noted to have hypertension listed in 2008. She has not had any treatment for this condition for "many years". I will follow-up on this at her next visit, if she persists within the hypertensive range I will likely initiate medical therapy. I discussed this with the patient and she was understanding of this possibility, I also stated that if she is been taking over-the-counter NSAIDs this could also contribute to some of her hypertension.

## 2015-07-14 NOTE — Assessment & Plan Note (Addendum)
Stable:  Patient's pain is at baseline. No significant issues or flares reported by the patient. Pain is well controlled with narcotic medication. Patient is endorsing some desire to decrease her overall narcotic use. We were able to discuss some techniques to accomplish this. - Medication refill - UDS obtained today; pending

## 2015-07-15 LAB — DRUG SCREEN, URINE
Amphetamine Screen, Ur: NEGATIVE
Barbiturate Quant, Ur: NEGATIVE
Benzodiazepines.: NEGATIVE
Cocaine Metabolites: NEGATIVE
Creatinine,U: 82.53 mg/dL
Marijuana Metabolite: NEGATIVE
Methadone: NEGATIVE
Opiates: NEGATIVE
Phencyclidine (PCP): NEGATIVE
Propoxyphene: NEGATIVE

## 2015-09-08 ENCOUNTER — Telehealth: Payer: Self-pay | Admitting: Family Medicine

## 2015-09-08 NOTE — Telephone Encounter (Signed)
She had an appt with [facility] to check the cyst on her chest.  The appt was cancelled and rescheduled for sometime in April. The cyst is painful, red around the edges and very irritating.  Can she get an antibotic for this? Please advise

## 2015-09-09 ENCOUNTER — Other Ambulatory Visit: Payer: Self-pay | Admitting: Family Medicine

## 2015-09-09 MED ORDER — SULFAMETHOXAZOLE-TRIMETHOPRIM 800-160 MG PO TABS: 1.0000 | ORAL_TABLET | Freq: Two times a day (BID) | ORAL | Status: DC

## 2015-09-09 NOTE — Telephone Encounter (Signed)
Please inform patient a 5 day course of bactrim has been sent in. Patient should to f/u on this skin infection within 7-10 days.

## 2015-09-11 NOTE — Telephone Encounter (Signed)
Left message on voice mail informing patient of below.

## 2015-09-15 ENCOUNTER — Other Ambulatory Visit: Payer: Self-pay | Admitting: Family Medicine

## 2015-09-15 ENCOUNTER — Telehealth: Payer: Self-pay | Admitting: Family Medicine

## 2015-09-15 DIAGNOSIS — M87051 Idiopathic aseptic necrosis of right femur: Secondary | ICD-10-CM

## 2015-09-15 DIAGNOSIS — M25551 Pain in right hip: Principal | ICD-10-CM

## 2015-09-15 DIAGNOSIS — G8929 Other chronic pain: Secondary | ICD-10-CM

## 2015-09-15 MED ORDER — HYDROCODONE-ACETAMINOPHEN 10-325 MG PO TABS: 1.0000 | ORAL_TABLET | Freq: Four times a day (QID) | ORAL | Status: DC | PRN

## 2015-09-15 NOTE — Telephone Encounter (Signed)
Script up front. Patient needs to get an appt soon

## 2015-09-15 NOTE — Telephone Encounter (Signed)
Pt called and would like a refill on her hydrocodone left up front for pickup. She will be looking at her upcoming schedule so that she can come and see the doctor. Please let patient know when ready to pick up. jw

## 2015-09-19 NOTE — Telephone Encounter (Signed)
Left a message for her to call us back. See message below. Gabryel Talamo Bruna PotterBlount, CMA

## 2015-10-19 ENCOUNTER — Ambulatory Visit (INDEPENDENT_AMBULATORY_CARE_PROVIDER_SITE_OTHER): Payer: Commercial Managed Care - HMO | Admitting: Family Medicine

## 2015-10-19 ENCOUNTER — Encounter: Payer: Self-pay | Admitting: Family Medicine

## 2015-10-19 VITALS — BP 120/80 | HR 74 | Temp 98.2°F | Ht 65.0 in | Wt 140.4 lb

## 2015-10-19 DIAGNOSIS — M25551 Pain in right hip: Secondary | ICD-10-CM | POA: Diagnosis not present

## 2015-10-19 DIAGNOSIS — M87051 Idiopathic aseptic necrosis of right femur: Secondary | ICD-10-CM | POA: Diagnosis not present

## 2015-10-19 DIAGNOSIS — G8929 Other chronic pain: Secondary | ICD-10-CM | POA: Diagnosis not present

## 2015-10-19 DIAGNOSIS — N938 Other specified abnormal uterine and vaginal bleeding: Secondary | ICD-10-CM | POA: Diagnosis not present

## 2015-10-19 LAB — POCT URINE PREGNANCY: Preg Test, Ur: NEGATIVE

## 2015-10-19 MED ORDER — MEDROXYPROGESTERONE ACETATE 150 MG/ML IM SUSP
150.0000 mg | Freq: Once | INTRAMUSCULAR | Status: AC
  Administered 2015-10-19: 150 mg via INTRAMUSCULAR

## 2015-10-19 MED ORDER — HYDROCODONE-ACETAMINOPHEN 10-325 MG PO TABS: 1.0000 | ORAL_TABLET | Freq: Four times a day (QID) | ORAL | Status: DC | PRN

## 2015-10-19 NOTE — Progress Notes (Signed)
   HPI  CC: Chronic hip pain and dysfunctional uterine bleeding. Patient is here for medication refill for her chronic hip pain. Pain has been well controlled on her current medication regimen. She has not had to take time off from work in quite some time. She states she feels well and is able to perform her job appropriately. She denies any limping, weakness, numbness, paresthesias, falls, trauma, swelling, effusions, erythema, or rash.  Patient would also like to discuss her dysfunctional uterine bleeding. Previously, we had discussed this and a referral has been placed to OB/GYN. At a prior visit I had discussed with her about the fact that it appeared her symptoms started after discontinuing Depo-Provera from her medication regimen. At that prior visit she had agreed with this but was not interested in restarting Depo-Provera at that time or any other hormonal contraception. Today she states she would like to restart Depo-Provera. She denies any recent sexual intercourse or risk for pregnancy. Menses continue to be regular with her baseline of light breakthrough bleeding throughout the month. She denies any fever, chills, vaginal pain, vaginal itching, dysuria, flank pain, lightheadedness, dizziness, shortness of breath, chest pain, nausea, vomiting, or diarrhea.  CC, SH/smoking status, and VS noted  Objective: BP 120/80 mmHg  Pulse 74  Temp(Src) 98.2 F (36.8 C) (Oral)  Ht 5\' 5"  (1.651 m)  Wt 140 lb 6.4 oz (63.685 kg)  BMI 23.36 kg/m2  SpO2 99%  LMP 10/02/2015 Gen: NAD, alert, cooperative, and pleasant. CV: RRR, no murmur Resp: CTAB, no wheezes, non-labored Ext: No edema, warm, pain with ROM of bilateral hips (R>L), decreased ROM of right hip (all at baseline). Strength 5/5 throughout.   Assessment and plan:  Avascular necrosis of femur head, right (HCC) Stable: Patient's pain and function of her hips are stable at this time. Well controlled on her current medication regimen. We  will obtain a UDS at the next visit. - Refilled medications. No changes at this time.  Dysfunctional uterine bleeding Unchanged: Patient has not been to see the OB/GYN referral which had been placed months ago. Today she states that she is willing to restart the Depo-Provera. Denies recent intercourse. Urine pregnancy test was negative. - Initiate Depo-Provera contraceptive therapy. - We'll monitor DUB response.    Orders Placed This Encounter  Procedures  . POCT urine pregnancy    Meds ordered this encounter  Medications  . HYDROcodone-acetaminophen (NORCO) 10-325 MG tablet    Sig: Take 1 tablet by mouth every 6 (six) hours as needed for severe pain.    Dispense:  120 tablet    Refill:  0  . medroxyPROGESTERone (DEPO-PROVERA) injection 150 mg    Sig:      Kathee DeltonIan D [name], MD,MS,  PGY2 10/19/2015 7:46 PM

## 2015-10-19 NOTE — Assessment & Plan Note (Signed)
Stable: Patient's pain and function of her hips are stable at this time. Well controlled on her current medication regimen. We will obtain a UDS at the next visit. - Refilled medications. No changes at this time.

## 2015-10-19 NOTE — Assessment & Plan Note (Signed)
Unchanged: Patient has not been to see the OB/GYN referral which had been placed months ago. Today she states that she is willing to restart the Depo-Provera. Denies recent intercourse. Urine pregnancy test was negative. - Initiate Depo-Provera contraceptive therapy. - We'll monitor DUB response.

## 2015-10-19 NOTE — Patient Instructions (Signed)
It was a pleasure seeing you today in our clinic. Today we discussed your pain and dysfunctional vaginal bleeding. Here is the treatment plan we have discussed and agreed upon together:   - I've refilled your medications at this time. - Please contact the general surgeon's office to schedule an appointment.  - Today we performed a urine pregnancy test. If this is negative then we will begin Depo-Provera contraception. - See me back in 1-2 months.

## 2015-11-13 ENCOUNTER — Other Ambulatory Visit: Payer: Self-pay | Admitting: Family Medicine

## 2015-11-13 DIAGNOSIS — M25551 Pain in right hip: Principal | ICD-10-CM

## 2015-11-13 DIAGNOSIS — M87051 Idiopathic aseptic necrosis of right femur: Secondary | ICD-10-CM

## 2015-11-13 DIAGNOSIS — G8929 Other chronic pain: Secondary | ICD-10-CM

## 2015-11-13 NOTE — Telephone Encounter (Signed)
Pt is calling because she needs a letter for work stating that she was sick today 11/13/15-11/14/15. Please call and leave up front. jw

## 2015-11-15 NOTE — Telephone Encounter (Signed)
Patient called back to check on the status of her request for a sick note.  Patient also needs a refill on her hydrocodone and was advised previously to call before she runs out.  Will forward to MD.  Burnard HawthorneJazmin Hartsell,CMA

## 2015-11-16 ENCOUNTER — Encounter: Payer: Self-pay | Admitting: Family Medicine

## 2015-11-16 ENCOUNTER — Other Ambulatory Visit: Payer: Self-pay | Admitting: Family Medicine

## 2015-11-16 MED ORDER — HYDROCODONE-ACETAMINOPHEN 10-325 MG PO TABS: 1.0000 | ORAL_TABLET | Freq: Four times a day (QID) | ORAL | Status: DC | PRN

## 2015-11-16 NOTE — Telephone Encounter (Signed)
Script left up front for pick up. Letter placed in Fax box.

## 2015-11-16 NOTE — Telephone Encounter (Signed)
Pt is calling to check the status of the letter for work she requested on 11/13/15. She needs us to now fax this to [phone]. jw

## 2015-11-17 NOTE — Telephone Encounter (Signed)
Pt informed. Hennesy Sobalvarro Dawn, CMA  

## 2015-11-23 ENCOUNTER — Telehealth: Payer: Self-pay | Admitting: Family Medicine

## 2015-11-23 NOTE — Telephone Encounter (Signed)
Patient's shoulder is beginning to bother her again and she believes she may need to start prednisone again. She would like to speak to Dr. [name] about this.

## 2015-11-24 ENCOUNTER — Other Ambulatory Visit: Payer: Self-pay | Admitting: Family Medicine

## 2015-11-24 ENCOUNTER — Telehealth: Payer: Self-pay | Admitting: Family Medicine

## 2015-11-24 MED ORDER — PREDNISONE 50 MG PO TABS: 50.0000 mg | ORAL_TABLET | Freq: Every day | ORAL | Status: DC

## 2015-11-24 NOTE — Telephone Encounter (Signed)
Called patient. Cell phone went straight to voicemail. Voicemail box is currently full.  I have sent a prescription for prednisone to patient's pharmacy. This should be ready for pickup later today, 11/24/2015.

## 2015-11-24 NOTE — Telephone Encounter (Signed)
Opened in error

## 2015-11-29 ENCOUNTER — Ambulatory Visit: Payer: Self-pay | Admitting: Family Medicine

## 2015-12-01 ENCOUNTER — Ambulatory Visit: Payer: Self-pay | Admitting: Family Medicine

## 2015-12-12 ENCOUNTER — Other Ambulatory Visit: Payer: Self-pay | Admitting: Family Medicine

## 2015-12-12 DIAGNOSIS — M87051 Idiopathic aseptic necrosis of right femur: Secondary | ICD-10-CM

## 2015-12-12 DIAGNOSIS — M25551 Pain in right hip: Principal | ICD-10-CM

## 2015-12-12 DIAGNOSIS — G8929 Other chronic pain: Secondary | ICD-10-CM

## 2015-12-12 NOTE — Telephone Encounter (Signed)
Needs refill on her vicodan.

## 2015-12-14 NOTE — Telephone Encounter (Signed)
Will need office visit

## 2015-12-14 NOTE — Telephone Encounter (Signed)
Patient has already has an appt with Dr. [name] next week, she spoke with Dr. [name] last week and her advised her that he would be out of town this week and to call for the RX for Vicodin. She will be out of pain meds before next week and needs meds.

## 2015-12-15 MED ORDER — HYDROCODONE-ACETAMINOPHEN 10-325 MG PO TABS: 1.0000 | ORAL_TABLET | Freq: Four times a day (QID) | ORAL | Status: DC | PRN

## 2015-12-15 NOTE — Telephone Encounter (Signed)
Note problem with initial printing. Printed a second time and left at front desk.

## 2015-12-15 NOTE — Telephone Encounter (Signed)
Prescription for #15 printed and left at front desk.

## 2015-12-20 ENCOUNTER — Ambulatory Visit (INDEPENDENT_AMBULATORY_CARE_PROVIDER_SITE_OTHER): Payer: Commercial Managed Care - HMO | Admitting: Family Medicine

## 2015-12-20 ENCOUNTER — Encounter: Payer: Self-pay | Admitting: Family Medicine

## 2015-12-20 VITALS — BP 129/90 | HR 73 | Temp 98.3°F | Wt 147.0 lb

## 2015-12-20 DIAGNOSIS — M25511 Pain in right shoulder: Secondary | ICD-10-CM | POA: Insufficient documentation

## 2015-12-20 DIAGNOSIS — M87051 Idiopathic aseptic necrosis of right femur: Secondary | ICD-10-CM | POA: Diagnosis not present

## 2015-12-20 DIAGNOSIS — G8929 Other chronic pain: Secondary | ICD-10-CM

## 2015-12-20 DIAGNOSIS — M25551 Pain in right hip: Secondary | ICD-10-CM

## 2015-12-20 MED ORDER — HYDROCODONE-ACETAMINOPHEN 10-325 MG PO TABS: 1.0000 | ORAL_TABLET | Freq: Four times a day (QID) | ORAL | Status: DC | PRN

## 2015-12-20 NOTE — Progress Notes (Signed)
   HPI  CC: Right shoulder pain and chronic hip pain Patient is here for muscle skeletal pain. 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 This pain is at her baseline and well controlled with her current medication regimen. 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  Patient's right shoulder pain is relatively new and persistent. Pain is located to the anterior aspect of the right shoulder. It is worsened with overhead movements. She denies any feelings of instability. She states that she did have an injury to this shoulder months ago and thought it had resolved but has now relapsed. She denies any feelings of popping clicking or catching now or at the time of injury. She has pain at night if she lays on this side. She denies any weakness, numbness, or paresthesias. She denies any reduction in range of motion.  Review of Systems   See HPI for ROS. All other systems reviewed and are negative.  CC, SH/smoking status, and VS noted  Objective: BP 129/90 mmHg  Pulse 73  Temp(Src) 98.3 F (36.8 C) (Oral)  Wt 147 lb (66.679 kg)  SpO2 99% Gen: NAD, alert, cooperative, and pleasant. Hip, Right: No edema, warm, pain with ROM of bilateral hips (R>L), decreased ROM of right hip (all at baseline). Strength 5/5 throughout. Shoulder, Right: Inspection reveals no abnormalities, atrophy or asymmetry. Palpation yields no tenderness over AC joint but moderate tenderness at the bicipital groove. ROM is full in all planes. Rotator cuff strength normal but w/ increased discomfort w/ empty-can. Negative Yergason's test, positive Speeds test. Positive Obrien's. Normal scapular function observed. No painful arc and no drop arm sign.    Assessment and plan:  Chronic right hip pain Currently at baseline. Pain well controlled with current medication regimen. - Refill meds.  Right anterior shoulder pain Right shoulder pain currently worsening.  Etiology unknown however physical exam is suggestive of rotator cuff tendinitis (supraspinatus) versus labral pathology (positive O'Brien's test) with associated bicipital tendinitis.  - Encouraged avoidance of overhead lifting - Encouraged icing the affected shoulder as tolerated - Encouraged home physical therapy with focused rotator cuff strengthening exercises. - Ibuprofen/Aleve for pain. - Offered a subacromial shoulder injection today. Patient refused but made aware that this would be available in the future.    Meds ordered this encounter  Medications  . HYDROcodone-acetaminophen (NORCO) 10-325 MG tablet    Sig: Take 1 tablet by mouth every 6 (six) hours as needed for severe pain.    Dispense:  120 tablet    Refill:  0     Kathee DeltonIan D Serenity Batley, MD,MS,  PGY2 12/20/2015 7:20 PM

## 2015-12-20 NOTE — Assessment & Plan Note (Signed)
Currently at baseline. Pain well controlled with current medication regimen. - Refill meds.

## 2015-12-20 NOTE — Assessment & Plan Note (Signed)
Right shoulder pain currently worsening. Etiology unknown however physical exam is suggestive of rotator cuff tendinitis (supraspinatus) versus labral pathology (positive O'Brien's test) with associated bicipital tendinitis.  - Encouraged avoidance of overhead lifting - Encouraged icing the affected shoulder as tolerated - Encouraged home physical therapy with focused rotator cuff strengthening exercises. - Ibuprofen/Aleve for pain. - Offered a subacromial shoulder injection today. Patient refused but made aware that this would be available in the future.

## 2015-12-20 NOTE — Patient Instructions (Addendum)
It was a pleasure seeing you today in our clinic. Today we discussed your right shoulder pain. Here is the treatment plan we have discussed and agreed upon together:  - Today refilled your pain medication. - Use ice as needed and tolerated. - If your pain becomes worse then I would be willing to provide you with a shoulder injection. This would be only beneficial for your symptoms and not the healing process. If you change your mind and would like to have this done just let me know and schedule an appointment. - I believe you may have some irritation or small tear of the rotator cuff, OR a small tear of the labrum of the shoulder. I think it would be wise to begin a home physical therapy regimen. I would like for you to perform these exercises once a day every day.  "Rotator cuff exercises"

## 2016-01-16 ENCOUNTER — Other Ambulatory Visit: Payer: Self-pay | Admitting: *Deleted

## 2016-01-16 DIAGNOSIS — M25551 Pain in right hip: Principal | ICD-10-CM

## 2016-01-16 DIAGNOSIS — M87051 Idiopathic aseptic necrosis of right femur: Secondary | ICD-10-CM

## 2016-01-16 DIAGNOSIS — G8929 Other chronic pain: Secondary | ICD-10-CM

## 2016-01-17 NOTE — Telephone Encounter (Signed)
Attempted to call pt no answer, tried to leave a message and voicemail was full. Will try again later. Angela Walls, CMA

## 2016-01-17 NOTE — Telephone Encounter (Signed)
Pt stating that she only had to come in every other month for this medication. Please advise. Jessy Cybulski Bruna Potter, CMA

## 2016-01-18 ENCOUNTER — Telehealth: Payer: Self-pay | Admitting: *Deleted

## 2016-01-18 ENCOUNTER — Other Ambulatory Visit: Payer: Self-pay | Admitting: Family Medicine

## 2016-01-18 DIAGNOSIS — M87051 Idiopathic aseptic necrosis of right femur: Secondary | ICD-10-CM

## 2016-01-18 DIAGNOSIS — G8929 Other chronic pain: Secondary | ICD-10-CM

## 2016-01-18 DIAGNOSIS — M25551 Pain in right hip: Principal | ICD-10-CM

## 2016-01-18 MED ORDER — HYDROCODONE-ACETAMINOPHEN 10-325 MG PO TABS: 1.0000 | ORAL_TABLET | Freq: Four times a day (QID) | ORAL | 0 refills | Status: DC | PRN

## 2016-01-18 NOTE — Telephone Encounter (Signed)
Patient informed that MD will leave a rx for her pain medication up front on 01/19/16. Patient also informed that per MD she will have to come EVERY MONTH for further refills. Patient upset stating that this is not what was told to her at her last visit. Offered patient an appointment for August and patient states she can only do Mondays, informed patient that MD is not in clinic on a Monday any in the month of August, patient scheduled for 8/10 but states she probably wont be able to make that since its not a Monday. Patient angry and wants MD to call her because she doesn't like how 'he told me one thing in the office visit now he saying something else'. Will forward to PCP.

## 2016-01-22 NOTE — Telephone Encounter (Signed)
Called patient's cell phone back. No answer, VM is full. Will look forward to seeing her at 8/10 appt.

## 2016-02-01 ENCOUNTER — Ambulatory Visit: Payer: Self-pay | Admitting: Family Medicine

## 2016-02-09 ENCOUNTER — Telehealth: Payer: Self-pay | Admitting: Family Medicine

## 2016-02-09 NOTE — Telephone Encounter (Signed)
Dr [name] told pt anytime she took off from work because of her hip to call him so he could write her a drs note.  She took off today because of her hip and would like to have a drs note

## 2016-02-12 ENCOUNTER — Encounter: Payer: Self-pay | Admitting: Family Medicine

## 2016-02-12 NOTE — Telephone Encounter (Signed)
Note left up front 

## 2016-02-13 NOTE — Telephone Encounter (Signed)
Pt informed. Angela Walls, CMA  

## 2016-02-19 ENCOUNTER — Telehealth: Payer: Self-pay | Admitting: Family Medicine

## 2016-02-19 NOTE — Telephone Encounter (Signed)
If patient can wait until her appointment on 8/31 I will refill them at that time. If not I can do so a day or 2 earlier.

## 2016-02-19 NOTE — Telephone Encounter (Signed)
Refill request for hydrocodone.

## 2016-02-19 NOTE — Telephone Encounter (Signed)
Will forward to MD, patient has appointment scheduled for 8/31

## 2016-02-21 ENCOUNTER — Other Ambulatory Visit: Payer: Self-pay | Admitting: Family Medicine

## 2016-02-21 DIAGNOSIS — G8929 Other chronic pain: Secondary | ICD-10-CM

## 2016-02-21 DIAGNOSIS — M25551 Pain in right hip: Principal | ICD-10-CM

## 2016-02-21 DIAGNOSIS — M87051 Idiopathic aseptic necrosis of right femur: Secondary | ICD-10-CM

## 2016-02-21 MED ORDER — HYDROCODONE-ACETAMINOPHEN 10-325 MG PO TABS: 1.0000 | ORAL_TABLET | Freq: Four times a day (QID) | ORAL | 0 refills | Status: DC | PRN

## 2016-02-21 NOTE — Telephone Encounter (Signed)
Patient is aware. Angela Walls,CMA  

## 2016-02-21 NOTE — Telephone Encounter (Signed)
Left message on voicemail informing patient that MD could either fill rx today or do so at her appointment tomm. Asked that patient call back if she would like rx filled today.

## 2016-02-21 NOTE — Telephone Encounter (Signed)
Rx left up front

## 2016-02-21 NOTE — Telephone Encounter (Signed)
Patient states that she would like to pick up her script today and that she is still planning to make her appt tomorrow since she is off. Kell Ferris,CMA

## 2016-02-22 ENCOUNTER — Ambulatory Visit (INDEPENDENT_AMBULATORY_CARE_PROVIDER_SITE_OTHER): Payer: Commercial Managed Care - HMO | Admitting: Family Medicine

## 2016-02-22 DIAGNOSIS — G8929 Other chronic pain: Secondary | ICD-10-CM

## 2016-02-22 DIAGNOSIS — M25551 Pain in right hip: Secondary | ICD-10-CM

## 2016-02-22 DIAGNOSIS — M87051 Idiopathic aseptic necrosis of right femur: Secondary | ICD-10-CM | POA: Diagnosis not present

## 2016-02-22 MED ORDER — HYDROCODONE-ACETAMINOPHEN 10-325 MG PO TABS: 1.0000 | ORAL_TABLET | Freq: Four times a day (QID) | ORAL | 0 refills | Status: DC | PRN

## 2016-02-22 NOTE — Progress Notes (Signed)
   HPI  CC: hip pain. Patient is here for a follow-up on her hip pain. 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 Patient is very physically active and works as a Geophysical data processorpostal delivery person. Pain is currently under moderate control but she states that she is relatively comfortable with her mobility at this time. No desires to increase pain medication this time. Overall feels well.  Denies any changes in her range of motion. Denies swelling, erythema, ecchymoses, new injury/trauma, weakness, numbness, or paresthesias.  Review of Systems   See HPI for ROS. All other systems reviewed and are negative.  CC, SH/smoking status, and VS noted  Objective: BP 137/75 (BP Location: Right Arm, Patient Position: Sitting, Cuff Size: Normal)   Pulse 77   Temp 98.6 F (37 C) (Oral)   Ht 5\' 5"  (1.651 m)   Wt 149 lb (67.6 kg)   BMI 24.79 kg/m  Gen: NAD, alert, cooperative, and pleasant. CV: RRR, no murmur Resp: CTAB, no wheezes, non-labored Ext: No edema, warm, pain with ROM of bilateral hips (R>L), decreased ROM of right hip (all at baseline). Strength 5/5 throughout.  Assessment and plan:  Chronic right hip pain Chronic pain appears to be well-controlled with regular pain medication. Patient did endorse some occasional feelings of weakness in her right quadricep. - Filled pain medication Rxs for a total of a 3 month period. - Home exercises provided for quadricep strengthening. (Leg raises in full extension with toe pointed outward/straight up/inward) - Discussed possible physical therapy referral. Patient is very much wanting to initiate this when her schedule calms down after Christmas (patient was the individual who first brought this up during our conversation).   Meds ordered this encounter  Medications  . DISCONTD: HYDROcodone-acetaminophen (NORCO) 10-325 MG tablet    Sig: Take 1 tablet by mouth every 6 (six) hours as needed for severe pain.    Dispense:  120 tablet   Refill:  0    Do Not Refill Until 03/23/16  . HYDROcodone-acetaminophen (NORCO) 10-325 MG tablet    Sig: Take 1 tablet by mouth every 6 (six) hours as needed for severe pain.    Dispense:  120 tablet    Refill:  0    Do Not Refill Until 04/22/16     Kathee DeltonIan D Johnathan Heskett, MD,MS,  PGY3 02/22/2016 7:07 PM

## 2016-02-22 NOTE — Assessment & Plan Note (Addendum)
Chronic pain appears to be well-controlled with regular pain medication. Patient did endorse some occasional feelings of weakness in her right quadricep. - Filled pain medication Rxs for a total of a 3 month period. - Home exercises provided for quadricep strengthening. (Leg raises in full extension with toe pointed outward/straight up/inward) - Discussed possible physical therapy referral. Patient is very much wanting to initiate this when her schedule calms down after Christmas (patient was the individual who first brought this up during our conversation).

## 2016-02-22 NOTE — Patient Instructions (Signed)
It was a pleasure seeing you today in our clinic. Today we discussed your hip pain. Here is the treatment plan we have discussed and agreed upon together:   - I would like to have you begin doing exercises of your quadriceps. - Ice the affected hip as needed for pain. - I would like to see you back in 2-3 months for a medication refill and to recheck status of your hips.  Exercises: (With a fully extended leg)  - Leg lifts with the toe straight up: 153 (both legs)  - Leg lifts with the toe pointed outward: 153 (both legs)  - Leg lifts with the toe pointed inward: 153 (both legs)

## 2016-03-04 MED FILL — predniSONE 50 MG TABS: 50 | 5 days supply | Qty: 5 | Fill #0

## 2016-03-20 ENCOUNTER — Ambulatory Visit: Payer: Self-pay

## 2016-03-21 ENCOUNTER — Ambulatory Visit (INDEPENDENT_AMBULATORY_CARE_PROVIDER_SITE_OTHER): Payer: Commercial Managed Care - HMO | Admitting: *Deleted

## 2016-03-21 DIAGNOSIS — Z3042 Encounter for surveillance of injectable contraceptive: Secondary | ICD-10-CM

## 2016-03-21 LAB — POCT URINE PREGNANCY: Preg Test, Ur: NEGATIVE

## 2016-03-21 MED ORDER — MEDROXYPROGESTERONE ACETATE 150 MG/ML IM SUSP
150.0000 mg | Freq: Once | INTRAMUSCULAR | Status: AC
  Administered 2016-03-21: 150 mg via INTRAMUSCULAR

## 2016-03-21 NOTE — Progress Notes (Signed)
   Pt late for Depo Provera injection.  Pregnancy test ordered; results negative. Pt tolerated Depo injection. Depo given left upper outer quadrant.  Next injection due December 14-28,2017-.  Reminder card given. Clovis PuMartin, Rojelio Uhrich L, RN

## 2016-05-18 ENCOUNTER — Telehealth: Payer: Self-pay | Admitting: Student

## 2016-05-24 ENCOUNTER — Ambulatory Visit (INDEPENDENT_AMBULATORY_CARE_PROVIDER_SITE_OTHER): Payer: Commercial Managed Care - HMO | Admitting: Family Medicine

## 2016-05-24 DIAGNOSIS — G8929 Other chronic pain: Secondary | ICD-10-CM | POA: Diagnosis not present

## 2016-05-24 DIAGNOSIS — M25551 Pain in right hip: Secondary | ICD-10-CM | POA: Diagnosis not present

## 2016-05-24 DIAGNOSIS — M329 Systemic lupus erythematosus, unspecified: Secondary | ICD-10-CM

## 2016-05-24 MED ORDER — HYDROCODONE-ACETAMINOPHEN 10-325 MG PO TABS: 1.0000 | ORAL_TABLET | Freq: Four times a day (QID) | ORAL | 0 refills | Status: DC | PRN

## 2016-05-24 NOTE — Progress Notes (Signed)
   HPI  CC: Chronic hip pain Patient is here to follow-up on her chronic hip pain. She states that her pain has been at its baseline. She states that she feels as though she is finding new techniques in which she can help control this pain a little bit better. The majority of our discussion today was focused on her desire to begin reducing her dependence on pain medication in order to fully function in the workplace. She brought up the possibility that she may have to change jobs but also understands that her current occupation is what is keeping her in good shape and strong in the legs. She denies any setbacks at this time. No headaches, blurred vision, chest pain, shortness breath, nausea, vomiting, diarrhea, abdominal pain, fever, chills, rash, numbness, weakness, or paresthesias.  Review of Systems    See HPI for ROS. All other systems reviewed and are negative.  CC, SH/smoking status, and VS noted  Objective: BP 124/81 (BP Location: Left Arm, Patient Position: Sitting, Cuff Size: Normal)   Pulse 78   Temp 98.1 F (36.7 C) (Oral)   Ht 5\' 5"  (1.651 m)   Wt 145 lb 3.2 oz (65.9 kg)   SpO2 100%   BMI 24.16 kg/m  Gen:NAD, alert, cooperative, and pleasant. CV:RRR, no murmur Resp:CTAB, no wheezes, non-labored Ext:No edema, warm, pain with ROM of bilateral hips (R>L), decreased ROM of right hip (at baseline, or slightly improved). Strength 5/5 throughout.   Assessment and plan:  Chronic right hip pain Stable, chronic: Patient's hip pain is currently at its baseline. She would like to reduce her monthly pain medication dependence. We had a long discussion about this as well as her current occupation. - Medication refill today. - Patient is to attempts to reduce her medication need to 3 tablets daily (from 4). She can substitute with Tylenol or Aleve. We also discussed the possibility of breaking tablets in half when pain is less severe. - Patient follow-up appointment approximately  one month after filling her final medication refill. 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 Because of her busy schedule this time a fear we discussed that at the next visit we would strongly consider placing another referral for her to see a rheumatologist.   Meds ordered this encounter  Medications  . DISCONTD: HYDROcodone-acetaminophen (NORCO) 10-325 MG tablet    Sig: Take 1 tablet by mouth every 6 (six) hours as needed for severe pain.    Dispense:  120 tablet    Refill:  0    Do Not Refill Until 05/24/16  . DISCONTD: HYDROcodone-acetaminophen (NORCO) 10-325 MG tablet    Sig: Take 1 tablet by mouth every 6 (six) hours as needed for severe pain.    Dispense:  120 tablet    Refill:  0    Do Not Refill Until 06/24/16  . HYDROcodone-acetaminophen (NORCO) 10-325 MG tablet    Sig: Take 1 tablet by mouth every 6 (six) hours as needed for severe pain.    Dispense:  120 tablet    Refill:  0    Do Not Refill Until 07/25/16     Kathee DeltonIan D [name], MD,MS,  PGY3 05/24/2016 6:45 PM

## 2016-05-24 NOTE — Patient Instructions (Signed)
It was a pleasure seeing you today in our clinic. Today we discussed your pain. Here is the treatment plan we have discussed and agreed upon together:   - I refilled your pain medication today. As discussed in the office today I think it is wise for you to do your best to try to only take 3 tablets a day if possible. These tablets can be split in half so use this to your bandage by only taking a half a tablet when your pain is less severe. - Schedule a follow-up appointment with me about a month after you fill your pain medication for the final time. - Keep up the great work and stay motivated. Good luck this holiday season, I know that you and your coworkers are working your butt off!

## 2016-05-24 NOTE — Assessment & Plan Note (Signed)
Stable, chronic: Patient's hip pain is currently at its baseline. She would like to reduce her monthly pain medication dependence. We had a long discussion about this as well as her current occupation. - Medication refill today. - Patient is to attempts to reduce her medication need to 3 tablets daily (from 4). She can substitute with Tylenol or Aleve. We also discussed the possibility of breaking tablets in half when pain is less severe. - Patient follow-up appointment approximately one month after filling her final medication refill. 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

## 2016-05-24 NOTE — Assessment & Plan Note (Signed)
Stable: A she has not followed up with rheumatology in "a long time". Because of her busy schedule this time a fear we discussed that at the next visit we would strongly consider placing another referral for her to see a rheumatologist.

## 2016-06-26 ENCOUNTER — Ambulatory Visit (INDEPENDENT_AMBULATORY_CARE_PROVIDER_SITE_OTHER): Payer: Commercial Managed Care - HMO | Admitting: *Deleted

## 2016-06-26 DIAGNOSIS — Z3040 Encounter for surveillance of contraceptives, unspecified: Secondary | ICD-10-CM | POA: Diagnosis not present

## 2016-06-26 DIAGNOSIS — Z3042 Encounter for surveillance of injectable contraceptive: Secondary | ICD-10-CM

## 2016-06-26 LAB — POCT URINE PREGNANCY: Preg Test, Ur: NEGATIVE

## 2016-06-26 MED ORDER — MEDROXYPROGESTERONE ACETATE 150 MG/ML IM SUSP
150.0000 mg | Freq: Once | INTRAMUSCULAR | Status: AC
  Administered 2016-06-26: 150 mg via INTRAMUSCULAR

## 2016-06-26 NOTE — Progress Notes (Signed)
   Pt late for Depo Provera injection. Pregnancy test ordered; result negative. Pt tolerated Depo injection. Depo given right upper outer quadrant.  Next injection due March 24-September 25, 2016.  Reminder card given. Clovis PuMartin, Melaya Hoselton L, RN

## 2016-06-27 NOTE — Telephone Encounter (Signed)
Done

## 2016-07-24 ENCOUNTER — Telehealth: Payer: Self-pay | Admitting: Family Medicine

## 2016-07-24 NOTE — Telephone Encounter (Signed)
Last visit patient was provided Rx for 3 months. This would mean that the last fill would be on 07/25/16. If patient gave Pharmacy all 3 Rx papers after I provided them then they likely won't be able to fill the last one until 07/25/16.  If patient did not turn in all 3 Rx papers, then she still has that Rx somewhere, and will need an appt for a refill

## 2016-07-24 NOTE — Telephone Encounter (Signed)
Pharmacy is telling pt she doesn't have pain med RX to be filled in Feb.  [facility] [address] [facility] the [facility].  Please let her know when she can pick up the RX.

## 2016-07-25 NOTE — Telephone Encounter (Signed)
Pt wants to talk to dr Tretha Sciaramckea. She says she had 2 papers at her last visit- one had 2 prescriptions on it and the other had just one.[facility] and she says she doesn't have it. Please advise

## 2016-07-26 NOTE — Telephone Encounter (Signed)
Pharmacy called pt. They found the prescription so she is good for this month

## 2016-08-07 ENCOUNTER — Ambulatory Visit: Payer: Self-pay | Admitting: Family Medicine

## 2016-08-14 ENCOUNTER — Ambulatory Visit: Payer: Self-pay | Admitting: Family Medicine

## 2016-08-15 ENCOUNTER — Encounter: Payer: Self-pay | Admitting: Family Medicine

## 2016-08-15 ENCOUNTER — Ambulatory Visit (INDEPENDENT_AMBULATORY_CARE_PROVIDER_SITE_OTHER): Payer: Commercial Managed Care - HMO | Admitting: Family Medicine

## 2016-08-15 DIAGNOSIS — M7061 Trochanteric bursitis, right hip: Secondary | ICD-10-CM | POA: Diagnosis not present

## 2016-08-15 NOTE — Assessment & Plan Note (Addendum)
Patient is here with right-sided hip pain that is different from her typical/chronic pain and is consistent with posttraumatic greater trochanteric bursitis.  - Offered bursal steroid injection today >> patient declined - RICE therapy as needed/tolerated (focus on ice and rest) - NSAIDs for anti-inflammatory purposes - Discussed expectations as this pain will likely improve but may linger - No pain meds filled today; will likely need refill at next visit.  Next: If patient's pain does not improve over the next month then would consider x-ray to assess for avulsion. (unlikely)

## 2016-08-15 NOTE — Progress Notes (Signed)
   HPI  CC: Right hip pain Patient is here with acute onset right hip pain. She is typically seen for chronic hip pain due to complications of AVN and lupus. Patient states that she had fallen while at work about a month ago. At the time she didn't think anything of it but a few days after she had significant worsening in her typically chronic hip pain. Pain was located along the lateral aspect of the right hip/thigh. Worse with movement/ambulation. It limits her range of motion. She denies any weakness. Pain does not radiate. She denies any fevers, chills, dizziness, headache, blurred vision.  Review of Systems    See HPI for ROS. All other systems reviewed and are negative.  CC, SH/smoking status, and VS noted  Objective: BP (!) 150/78   Pulse 79   Temp 98.5 F (36.9 C) (Oral)   Ht 5\' 5"  (1.651 m)   Wt 153 lb 9.6 oz (69.7 kg)   SpO2 98%   BMI 25.56 kg/m  Gen:NAD, alert, cooperative, and pleasant. CV:RRR, no murmur Resp:CTAB, no wheezes, non-labored Hip, Right:No edema, warm, point tenderness at the greater trochanter. Pain with ROM of bilateral hips (R>L) with pain greater in Rt than her typical baseline. Pain exacerbated with internal rotation and adduction at the hip, decreased ROM of right hip is also worse than typical baseline (limitation 2/2 pain rather than mechanical). Strength 5/5 throughout. No erythema or ecchymoses. No evidence of leg length discrepancy.  Assessment and plan:  Greater trochanteric bursitis of right hip Patient is here with right-sided hip pain that is different from her typical/chronic pain and is consistent with posttraumatic greater trochanteric bursitis.  - Offered bursal steroid injection today >> patient declined - RICE therapy as needed/tolerated (focus on ice and rest) - NSAIDs for anti-inflammatory purposes - Discussed expectations as this pain will likely improve but may linger - No pain meds filled today; will likely need refill at next  visit.  Next: If patient's pain does not improve over the next month then would consider x-ray to assess for avulsion. (unlikely)   Kathee DeltonIan D [name], MD,MS,  PGY3 08/15/2016 7:05 PM

## 2016-08-15 NOTE — Patient Instructions (Signed)
It was a pleasure seeing you today in our clinic. Today we discussed your right hip pain. This injury and the pain involved may take a long time to heal/resolve, so be patient. Here is the treatment plan I would like for you to follow:  For most musculoskeletal pain we physicians tend to recommend the "R.I.C.E." method of therapy. This stands for "Rest", "Ice", "Compression", and "Elevation". - Rest: I would like for you to take it easy for a period of time. This may be a period of days or weeks, everyone is different. Allow pain to be your guide. Achiness and soreness is to be expected, but true pain is to be avoided. - Ice: Using an ice pack 2-4 times a day for ~20 minutes each time can help with pain, swelling, and healing. - Compression: Using a compression sleeve or ACE Bandage (both available at most pharmacies) can help with swelling and pain. Having these on through the day is ideal, but do not sleep with these in place. - Elevation: It is not always possible to elevate an injury due to it's location on your body. However, whenever possible it would be beneficial to elevate musculoskeletal injuries above the level of the heart. (Example: propping an injured ankle up on pillows at night while asleep)    Aleve (naproxen) and Tylenol: For most musculoskeletal pain I tell patients to alternate taking Aleve/naproxen and Tylenol. Begin taking the Aleve/naproxen 2 times a day (scheduled), then take the Tylenol inbetween each of the tylenol dosings as needed for additional pain (this allows for Tylenol to be taken up to 3 times a day).  Dosing: - 440-1000mg  of Aleve/naproxen 2 times a day (max dose).  - 650-1000mg  of Tylenol 3 times a day (max dose).

## 2016-08-23 ENCOUNTER — Other Ambulatory Visit: Payer: Self-pay | Admitting: Family Medicine

## 2016-08-23 ENCOUNTER — Ambulatory Visit: Payer: Self-pay | Admitting: Family Medicine

## 2016-08-23 ENCOUNTER — Telehealth: Payer: Self-pay | Admitting: Family Medicine

## 2016-08-23 DIAGNOSIS — M25551 Pain in right hip: Principal | ICD-10-CM

## 2016-08-23 DIAGNOSIS — G8929 Other chronic pain: Secondary | ICD-10-CM

## 2016-08-23 MED ORDER — HYDROCODONE-ACETAMINOPHEN 10-325 MG PO TABS: 1.0000 | ORAL_TABLET | Freq: Four times a day (QID) | ORAL | 0 refills | Status: DC | PRN

## 2016-08-23 NOTE — Telephone Encounter (Signed)
Pt calling to request refill of:  Name of Medication(s):vicodan Last date of OV: 08-15-16 Pharmacy:will pick up  Will route refill request to Clinic RN.  Discussed with patient policy to call pharmacy for future refills.  Also, discussed refills may take up to 48 hours to approve or deny.  Avanell ShackletonHarriet C Shelton

## 2016-08-23 NOTE — Telephone Encounter (Signed)
I see! That's fine. I have placed a 1 month Rx up front for her -- please contact her about this as soon as you can since we are closed for the wknd.  Thank you!

## 2016-08-23 NOTE — Telephone Encounter (Signed)
Left message on voicemail that rx was ready to be picked up. 

## 2016-08-23 NOTE — Telephone Encounter (Signed)
Left message for patient to call back for clarification. MD states patient told him at last appointment that she had enough medication to last until next appointment.

## 2016-08-23 NOTE — Telephone Encounter (Signed)
Pt called back. She had to reschedule her appt today to 09-02-16. She was unable to keep her appt today for her pap because she started her period.  She did have enough medicine to last her until today

## 2016-09-02 ENCOUNTER — Ambulatory Visit: Payer: Self-pay | Admitting: Family Medicine

## 2016-09-17 ENCOUNTER — Ambulatory Visit: Payer: Commercial Managed Care - HMO

## 2016-09-18 ENCOUNTER — Encounter: Payer: Self-pay | Admitting: Family Medicine

## 2016-09-18 ENCOUNTER — Telehealth: Payer: Self-pay | Admitting: Family Medicine

## 2016-09-18 ENCOUNTER — Ambulatory Visit (INDEPENDENT_AMBULATORY_CARE_PROVIDER_SITE_OTHER): Payer: Commercial Managed Care - HMO | Admitting: *Deleted

## 2016-09-18 VITALS — Ht 65.0 in | Wt 155.0 lb

## 2016-09-18 DIAGNOSIS — Z3040 Encounter for surveillance of contraceptives, unspecified: Secondary | ICD-10-CM

## 2016-09-18 DIAGNOSIS — Z3042 Encounter for surveillance of injectable contraceptive: Secondary | ICD-10-CM

## 2016-09-18 MED ORDER — MEDROXYPROGESTERONE ACETATE 150 MG/ML IM SUSP
150.0000 mg | Freq: Once | INTRAMUSCULAR | Status: AC
  Administered 2016-09-18: 150 mg via INTRAMUSCULAR

## 2016-09-18 NOTE — Telephone Encounter (Signed)
Called patient x 2, no answer no voicemail. If patient calls back please inform her that letter is ready to be picked up.

## 2016-09-18 NOTE — Telephone Encounter (Signed)
Note written and left up front 

## 2016-09-18 NOTE — Progress Notes (Signed)
   Pt in for Depo Provera injection.  Pt tolerated Depo injection. Depo given left upper outer quadrant.  Next injection due June 13-27, 2018.  Reminder card given. Clovis PuMartin, Tamika L, RN

## 2016-09-18 NOTE — Telephone Encounter (Signed)
Pt wouldl like a note for work from dr Huntsman Corporationmckeag.  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  She isnt sure if it is her lupus or something else.  Please contact patient to let her know if this can be done.

## 2016-09-23 ENCOUNTER — Ambulatory Visit: Payer: Self-pay | Admitting: Family Medicine

## 2016-09-23 ENCOUNTER — Other Ambulatory Visit: Payer: Self-pay | Admitting: Family Medicine

## 2016-09-23 DIAGNOSIS — M25551 Pain in right hip: Principal | ICD-10-CM

## 2016-09-23 DIAGNOSIS — G8929 Other chronic pain: Secondary | ICD-10-CM

## 2016-09-23 NOTE — Telephone Encounter (Signed)
Patient calling to request refill of:  Name of Medication(s):  HYDROcodone-acetaminophen  Last date of OV:  08/15/2016 Pharmacy:  [facility] [address]   Will route refill request to Clinic RN.  Discussed with patient policy to call pharmacy for future refills.  Also, discussed refills may take up to 48 hours to approve or deny. Patient was schedule to see [name] today at 2:30PM but was called into work. Appt has been reschedule to 4/17 @ 3:15PM.

## 2016-09-23 NOTE — Telephone Encounter (Signed)
Called again, no answer, no voicemail. 

## 2016-09-24 MED ORDER — HYDROCODONE-ACETAMINOPHEN 10-325 MG PO TABS: 1.0000 | ORAL_TABLET | Freq: Four times a day (QID) | ORAL | 0 refills | Status: DC | PRN

## 2016-10-07 ENCOUNTER — Telehealth: Payer: Self-pay | Admitting: Family Medicine

## 2016-10-07 NOTE — Telephone Encounter (Signed)
Called to confirm appointment for 10/08/16. Patient was advised to arrive early for check-in and to bring any current medications. No further concerns at this time. °

## 2016-10-08 ENCOUNTER — Ambulatory Visit (INDEPENDENT_AMBULATORY_CARE_PROVIDER_SITE_OTHER): Payer: Commercial Managed Care - HMO | Admitting: Family Medicine

## 2016-10-08 ENCOUNTER — Encounter: Payer: Self-pay | Admitting: Family Medicine

## 2016-10-08 DIAGNOSIS — G8929 Other chronic pain: Secondary | ICD-10-CM

## 2016-10-08 DIAGNOSIS — M25551 Pain in right hip: Secondary | ICD-10-CM | POA: Diagnosis not present

## 2016-10-08 MED ORDER — HYDROCODONE-ACETAMINOPHEN 10-325 MG PO TABS: 1.0000 | ORAL_TABLET | Freq: Four times a day (QID) | ORAL | 0 refills | Status: DC | PRN

## 2016-10-08 NOTE — Progress Notes (Signed)
   HPI  CC: Chronic hip pain Patient is here for follow-up on her chronic hip pain. She states that she had a recent flare in her pain sometime last month which required taking a couple days off of work but since that time she's been feeling well. Pain persists along the right side. Worse with stairs and hills. Pain is experienced on all days and is relatively unchanged from her baseline. No radiation of pain. No bowel/bladder incontinence. No saddle anesthesia. No recent injury or trauma. No weakness, numbness, paresthesias, new limitations and ROM, or episodes of "giving out".  Review of Systems See HPI for ROS.   CC, SH/smoking status, and VS noted  Objective: BP 114/70   Pulse 73   Temp 97.6 F (36.4 C) (Oral)   Ht  (1.651 m)   Wt 153 lb (69.4 kg)   SpO2 99%   BMI 25.46 kg/m  Gen: NAD, alert, cooperative, and pleasant. HEENT: NCAT, EOMI, PERRL CV: RRR, no murmur Resp: CTAB, no wheezes, non-labored Ext:No edema, warm, pain with ROM of bilateral hips (R>L), decreased ROM of right hip (at baseline, or slightly improved). Strength 5/5 throughout.   Assessment and plan:  Chronic right hip pain Stable/controlled. Patient is here with her chronic right-sided hip pain. 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 No new changes at this time. Patient endorses good pain control on current regimen. Continues to stay active and requires pain control while doing so. - Refilled Norco - Follow-up 3 months. (needs Pap; I have asked that this get scheduled for the next visit)   Meds ordered this encounter  Medications  . DISCONTD: HYDROcodone-acetaminophen (NORCO) 10-325 MG tablet    Sig: Take 1 tablet by mouth every 6 (six) hours as needed for severe pain.    Dispense:  120 tablet    Refill:  0    Do Not Refill Until 09/22/16  . DISCONTD: HYDROcodone-acetaminophen (NORCO) 10-325 MG tablet    Sig: Take 1 tablet by mouth every 6 (six) hours as needed for severe pain.   Dispense:  120 tablet    Refill:  0    Do Not Refill Until 10/22/16  . HYDROcodone-acetaminophen (NORCO) 10-325 MG tablet    Sig: Take 1 tablet by mouth every 6 (six) hours as needed for severe pain.    Dispense:  120 tablet    Refill:  0    Do Not Refill Until 11/22/16     Kathee Delton, MD,MS,  PGY3 10/08/2016 4:15 PM

## 2016-10-08 NOTE — Assessment & Plan Note (Addendum)
Stable/controlled. Patient is here with her chronic right-sided hip pain. 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 No new changes at this time. Patient endorses good pain control on current regimen. Continues to stay active and requires pain control while doing so. - Refilled Norco - Follow-up 3 months. (needs Pap; I have asked that this get scheduled for the next visit)

## 2016-10-30 ENCOUNTER — Ambulatory Visit: Payer: Commercial Managed Care - HMO | Admitting: Podiatry

## 2016-11-06 ENCOUNTER — Ambulatory Visit: Payer: Commercial Managed Care - HMO | Admitting: Podiatry

## 2016-11-24 ENCOUNTER — Encounter (HOSPITAL_COMMUNITY): Payer: Self-pay

## 2016-11-24 ENCOUNTER — Emergency Department (HOSPITAL_COMMUNITY)
Admission: EM | Admit: 2016-11-24 | Discharge: 2016-11-24 | Disposition: A | Discharge status: Home / Self Care | Payer: Commercial Managed Care - HMO | Attending: Emergency Medicine | Admitting: Emergency Medicine

## 2016-11-24 DIAGNOSIS — Y929 Unspecified place or not applicable: Secondary | ICD-10-CM | POA: Diagnosis not present

## 2016-11-24 DIAGNOSIS — Y939 Activity, unspecified: Secondary | ICD-10-CM | POA: Diagnosis not present

## 2016-11-24 DIAGNOSIS — M3210 Systemic lupus erythematosus, organ or system involvement unspecified: Secondary | ICD-10-CM | POA: Diagnosis not present

## 2016-11-24 DIAGNOSIS — S20362A Insect bite (nonvenomous) of left front wall of thorax, initial encounter: Secondary | ICD-10-CM | POA: Diagnosis present

## 2016-11-24 DIAGNOSIS — Y998 Other external cause status: Secondary | ICD-10-CM | POA: Insufficient documentation

## 2016-11-24 DIAGNOSIS — I10 Essential (primary) hypertension: Secondary | ICD-10-CM | POA: Diagnosis not present

## 2016-11-24 DIAGNOSIS — W57XXXA Bitten or stung by nonvenomous insect and other nonvenomous arthropods, initial encounter: Secondary | ICD-10-CM | POA: Insufficient documentation

## 2016-11-24 MED ORDER — DOXYCYCLINE HYCLATE 100 MG PO TABS
200.0000 mg | ORAL_TABLET | Freq: Once | ORAL | Status: AC
  Administered 2016-11-24: 200 mg via ORAL
  Filled 2016-11-24: qty 2

## 2016-11-24 NOTE — ED Provider Notes (Signed)
[facility] Provider Note   CSN: [id number] Arrival date & time: 11/24/16  1324  By signing my name below, I, Rosana Fret, attest that this documentation has been prepared under the direction and in the presence of non-physician practitioner, Russo, [place], PA-C. Electronically Signed: Rosana Fret, ED Scribe. 11/24/16. 10:02 AM.  History   Chief Complaint Chief Complaint  Patient presents with  . Insect Bite   The history is provided by the patient. No language interpreter was used.   HPI Comments: Angela Walls is a 36 y.o. female who presents to the Emergency Department complaining of a moderate, gradually worsening area of pain and swelling to the left chest under her arm onset 2 days ago. Pt states she removed a medium sized tick from the area 2 days ago. She reports associated ichiness to the area. No treatments tried prior to arrival in the ED. Pt states she is allergic to Penicillin. She denies fever, rashes to other parts of her body, N/V, sore throat, flu-like symptoms, arthralgias, or any other complaints at this time.   Past Medical History:  Diagnosis Date  . Anemia   . 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 On chronic narcotics for this.  . Hypertension   . Lupus    Followed by Rheum. On chronic Prednisone.    Patient Active Problem List   Diagnosis Date Noted  . Greater trochanteric bursitis of right hip 08/15/2016  . 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 Dysfunctional uterine bleeding 12/02/2014  . Heart murmur 11/15/2013  . Chronic right hip pain 02/06/2009  . Avascular necrosis of femur head, right (HCC) 08/28/2006  . HYPERTENSION, BENIGN SYSTEMIC 08/21/2006  . SLE 08/21/2006  . Proteinuria 08/21/2006    Past Surgical History:  Procedure Laterality Date  . APPENDECTOMY  2001  . CESAREAN SECTION  2010    OB History    Gravida Para Term Preterm AB Living   3 2   2 1 2    SAB TAB Ectopic Multiple  Live Births   1       2       Home Medications    Prior to Admission medications   Medication Sig Start Date End Date Taking? Authorizing Provider  cholecalciferol (VITAMIN D) 1000 UNITS tablet Take 1,000 Units by mouth daily.    [provider]  cyclobenzaprine (FLEXERIL) 10 MG tablet Take 1 tablet (10 mg total) by mouth 2 (two) times daily as needed for muscle spasms. 06/06/15   [name], Janine Ores, MD  HYDROcodone-acetaminophen (NORCO) 10-325 MG tablet Take 1 tablet by mouth every 6 (six) hours as needed for severe pain. 10/08/16   [name], Janine Ores, MD  Multiple Vitamin (MULTIVITAMIN WITH MINERALS) TABS tablet Take 1 tablet by mouth daily.    [provider]  naproxen sodium (ALEVE) 220 MG tablet Take 1 tablet (220 mg total) by mouth 2 (two) times daily with a meal. 05/23/15   [name], Janine Ores, MD    Family History Family History  Problem Relation Age of Onset  . Hypertension Father   . Hypertension Maternal Grandmother   . Hypertension Paternal Grandmother   . Stroke Neg Hx   . Heart disease Neg Hx   . Cancer Neg Hx     Social History Social History  Substance Use Topics  . Smoking status: Never Smoker  . Smokeless tobacco: Never Used  . Alcohol use No     Allergies  Penicillins   Review of Systems Review of Systems  Constitutional: Negative for fever.  HENT: Negative for sore throat.   Gastrointestinal: Negative for nausea and vomiting.  Musculoskeletal: Negative for arthralgias.  Skin: Positive for wound. Negative for rash.  Neurological: Negative for headaches.     Physical Exam Updated Vital Signs BP 128/88 (BP Location: Right Arm)   Pulse 66   Temp 98.1 F (36.7 C) (Oral)   Resp 14   Ht 5\' 5"  (1.651 m)   Wt 72.6 kg (160 lb)   SpO2 100%   BMI 26.63 kg/m   Physical Exam  Constitutional: She appears well-developed and well-nourished.  HENT:  Head: Normocephalic and atraumatic.  Eyes: Conjunctivae are normal.  Cardiovascular: Normal  rate.   Pulmonary/Chest: Effort normal.  Skin: There is erythema.  Left chest at mid axillary line with red, dome shaped lesion with 1 cm of surrounding erythema with excoriation. No target lesion noted. No lymphadenopathy. No rashes present on extremities.  Psychiatric: She has a normal mood and affect. Her behavior is normal.  Nursing note and vitals reviewed.    ED Treatments / Results  DIAGNOSTIC STUDIES: Oxygen Saturation is 100% on RA, normal by my interpretation.   COORDINATION OF CARE: 9:52 AM-Discussed next steps with pt including treatment with antibiotics. Pt verbalized understanding and is agreeable with the plan.   Labs (all labs ordered are listed, but only abnormal results are displayed) Labs Reviewed - No data to display  EKG  EKG Interpretation None       Radiology No results found.  Procedures Procedures (including critical care time)  Medications Ordered in ED Medications  doxycycline (VIBRA-TABS) tablet 200 mg (200 mg Oral Given 11/24/16 1005)     Initial Impression / Assessment and Plan / ED Course  I have reviewed the triage vital signs and the nursing notes.  Pertinent labs & imaging results that were available during my care of the patient were reviewed by me and considered in my medical decision making (see chart for details).     Pt with tick removed ~48 hours ago. Patient without fever, sore throat, flu-like symptoms. Vital signs stable, afebrile. No rash on extremities or trunk, with exception of small area of surrounding erythema tick bite. No erythema migrans. Pt treated with prophylactic dose of doxycycline in ED. Patient advised to follow up with primary care for recheck. Discussed symptoms of [place] spotted fever and Lyme disease, with strict return precautions. Pt safe for discharge home.  Patient discussed with Dr. Estell HarpinZammit. Discussed results, findings, treatment and follow up. Patient advised of return precautions. Patient  verbalized understanding and agreed with plan.   Final Clinical Impressions(s) / ED Diagnoses   Final diagnoses:  Tick bite, initial encounter    New Prescriptions Discharge Medication List as of 11/24/2016 10:06 AM     I personally performed the services described in this documentation, which was scribed in my presence. The recorded information has been reviewed and is accurate.     Russo, [place]il[place]Jordan N, PA-C 11/24/16 1152    Bethann BerkshireZammit, Joseph, MD 11/24/16 1207

## 2016-11-24 NOTE — ED Triage Notes (Signed)
Per Pt, PT is coming from home with complaints of tick bite on the left under arm. Pt was able to remove it and states having some itchiness at this time. Slight redness noted at the site. Denies any other symptoms.

## 2016-11-24 NOTE — Discharge Instructions (Signed)
Please read instructions below.  Monitor your tick bite for increasing redness.  Follow up with your primary care provider within 1 weeks time.  Return to the ER if you develop rash all over your body, fever, flu-like symptoms, or new or concerning symptoms.

## 2016-12-12 ENCOUNTER — Ambulatory Visit (INDEPENDENT_AMBULATORY_CARE_PROVIDER_SITE_OTHER): Payer: Commercial Managed Care - HMO | Admitting: Family Medicine

## 2016-12-12 ENCOUNTER — Encounter: Payer: Self-pay | Admitting: Family Medicine

## 2016-12-12 DIAGNOSIS — S2096XD Insect bite (nonvenomous) of unspecified parts of thorax, subsequent encounter: Secondary | ICD-10-CM | POA: Diagnosis not present

## 2016-12-12 DIAGNOSIS — W57XXXA Bitten or stung by nonvenomous insect and other nonvenomous arthropods, initial encounter: Secondary | ICD-10-CM | POA: Insufficient documentation

## 2016-12-12 DIAGNOSIS — W57XXXD Bitten or stung by nonvenomous insect and other nonvenomous arthropods, subsequent encounter: Secondary | ICD-10-CM

## 2016-12-12 NOTE — Assessment & Plan Note (Signed)
Patient is here to follow-up on her tick bite. She has had no additional symptoms since treatment in the ED. She feels well overall. Bite site is healing well, but has had some associated pruritus. - Reassurance - No additional actions at this time. - Follow-up when necessary

## 2016-12-12 NOTE — Progress Notes (Signed)
   HPI  CC: Follow-up tick bite Patient was seen in the ED for a tick bite. She was asked to follow-up with her PCP to assess for any development of additional symptoms. Patient was provided a prophylactic dose of doxycycline in the ED.   She states that she feels the bite itself is healing well. She denies any development of rash. The bite site has been itchy at times but overall non-bothersome. She denies any fevers, chills or night sweats. She denies any muscle or joint pain out of characteristic of her baseline.  Review of Systems See HPI for ROS.   CC, SH/smoking status, and VS noted  Objective: BP 130/82   Pulse 84   Temp 98 F (36.7 C) (Oral)   Wt 162 lb (73.5 kg)   BMI 26.96 kg/m  Gen: NAD, alert, cooperative. CV: Well-perfused. Resp: Non-labored. Neuro: Sensation intact throughout. Integument: 1 x 1 cm healing lesion at the posterior aspect of her left axilla, lateral to the inferior aspect of her scapula. Lesion has some hyperpigmentation and thickening consistent with wound healing. No surrounding erythema.   Assessment and plan:  Tick bite Patient is here to follow-up on her tick bite. She has had no additional symptoms since treatment in the ED. She feels well overall. Bite site is healing well, but has had some associated pruritus. - Reassurance - No additional actions at this time. - Follow-up when necessary   Kathee DeltonIan D Alvaretta Eisenberger, MD,MS,  PGY3 12/12/2016 5:01 PM

## 2016-12-16 ENCOUNTER — Other Ambulatory Visit: Payer: Self-pay | Admitting: Family Medicine

## 2016-12-16 DIAGNOSIS — G8929 Other chronic pain: Secondary | ICD-10-CM

## 2016-12-16 DIAGNOSIS — M25551 Pain in right hip: Principal | ICD-10-CM

## 2016-12-16 NOTE — Telephone Encounter (Signed)
Pt needs a refill on Vicodin. ep

## 2016-12-17 ENCOUNTER — Other Ambulatory Visit: Payer: Self-pay | Admitting: Family Medicine

## 2016-12-17 DIAGNOSIS — G8929 Other chronic pain: Secondary | ICD-10-CM

## 2016-12-17 DIAGNOSIS — M25551 Pain in right hip: Principal | ICD-10-CM

## 2016-12-17 MED ORDER — HYDROCODONE-ACETAMINOPHEN 10-325 MG PO TABS: 1.0000 | ORAL_TABLET | Freq: Four times a day (QID) | ORAL | 0 refills | Status: DC | PRN

## 2016-12-17 NOTE — Telephone Encounter (Signed)
Refills left up front.   Patient can pick up at any time.

## 2016-12-18 NOTE — Telephone Encounter (Signed)
Left message informing that rx was ready to be picked up.

## 2017-01-09 ENCOUNTER — Ambulatory Visit: Payer: Self-pay | Admitting: Internal Medicine

## 2017-01-17 ENCOUNTER — Ambulatory Visit: Payer: Self-pay | Admitting: Internal Medicine

## 2017-01-22 ENCOUNTER — Ambulatory Visit: Payer: Self-pay

## 2017-03-05 ENCOUNTER — Encounter (HOSPITAL_COMMUNITY): Payer: Self-pay | Admitting: Family Medicine

## 2017-03-05 ENCOUNTER — Ambulatory Visit (INDEPENDENT_AMBULATORY_CARE_PROVIDER_SITE_OTHER)

## 2017-03-05 ENCOUNTER — Ambulatory Visit (HOSPITAL_COMMUNITY)
Admission: EM | Admit: 2017-03-05 | Discharge: 2017-03-05 | Disposition: A | Discharge status: Home / Self Care | Attending: Family Medicine | Admitting: Family Medicine

## 2017-03-05 DIAGNOSIS — S4992XA Unspecified injury of left shoulder and upper arm, initial encounter: Secondary | ICD-10-CM | POA: Diagnosis not present

## 2017-03-05 DIAGNOSIS — W109XXA Fall (on) (from) unspecified stairs and steps, initial encounter: Secondary | ICD-10-CM

## 2017-03-05 DIAGNOSIS — W2209XA Striking against other stationary object, initial encounter: Secondary | ICD-10-CM

## 2017-03-05 MED ORDER — DICLOFENAC SODIUM 75 MG PO TBEC: 75.0000 mg | DELAYED_RELEASE_TABLET | Freq: Two times a day (BID) | ORAL | 0 refills | Status: DC

## 2017-03-05 NOTE — Discharge Instructions (Signed)
Please call occupational health tomorrow to schedule follow up appointment.

## 2017-03-05 NOTE — ED Triage Notes (Signed)
States she injured her left shoulder today.  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  Larey SeatFell going up steps in the rain.  States she landed on left shoulder.

## 2017-03-05 NOTE — ED Notes (Signed)
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

## 2017-03-06 NOTE — ED Provider Notes (Signed)
  [facility]   [id number] 03/05/17 Arrival Time: 1832  ASSESSMENT & PLAN:  1. Shoulder injury, left, initial encounter     Meds ordered this encounter  Medications  . diclofenac (VOLTAREN) 75 MG EC tablet    Sig: Take 1 tablet (75 mg total) by mouth 2 (two) times daily.    Dispense:  14 tablet    Refill:  0   No acute abnormality seen on x-rays tonight. Will call occupational health tomorrow for f/u. Work note given.  Reviewed expectations re: course of current medical issues. Questions answered. Outlined signs and symptoms indicating need for more acute intervention. Patient verbalized understanding. After Visit Summary given.   SUBJECTIVE:  Angela Walls is a 36 y.o. female who presents with complaint of L shoulder injury. Fell while going up stairs and thinks hit L shoulder on rail. Gradual onset of discomfort. No previous shoulder injury reported. Movement exacerbates. Holding shoulder still helps. No OTC analgesics taken. No extremity sensation changes or weakness.   ROS: As per HPI.   OBJECTIVE:  Vitals:   03/05/17 1905  BP: (!) 113/58  Pulse: 75  Resp: 18  Temp: 98 F (36.7 C)  TempSrc: Oral  SpO2: 100%    General appearance: alert; no distress Extremities: L shoulder with FROM but reports pain with movement; tender over AC joint; no swelling; no bruising Skin: warm and dry Psychological: alert and cooperative; normal mood and affect  Imaging: Dg Shoulder Left  Result Date: 03/05/2017 CLINICAL DATA:  Fall, landing on left shoulder.  Left shoulder pain. EXAM: LEFT SHOULDER - 2+ VIEW COMPARISON:  02/02/2015 FINDINGS: Mild degenerative changes in the [facility] with joint space narrowing and spurring. Glenohumeral joint is maintained. No acute bony abnormality. Specifically, no fracture, subluxation, or dislocation. Soft tissues are intact. IMPRESSION: No acute bony abnormality. Electronically Signed   By: Charlett NoseKevin  Dover M.D.   On: 03/05/2017 19:40     Allergies  Allergen Reactions  . Penicillins Hives and Swelling    Past Medical History:  Diagnosis Date  . Anemia   . 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 On chronic narcotics for this.  . Hypertension   . Lupus    Followed by Rheum. On chronic Prednisone.   Social History   Social History  . Marital status: Single    Spouse name: N/A  . Number of children: N/A  . Years of education: N/A   Occupational History  . 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 Smoking status: Never Smoker  . Smokeless tobacco: Never Used  . Alcohol use No  . Drug use: No  . Sexual activity: Yes    Birth control/ protection: None   Other Topics Concern  . Not on file   Social History Narrative   Lives with 35 year old and 72161 year old.  In a relationship with the 35 year old's father.   Bachelor's in [employer] education.  Works at [employer] now.    Family History  Problem Relation Age of Onset  . Hypertension Father   . Hypertension Maternal Grandmother   . Hypertension Paternal Grandmother   . Stroke Neg Hx   . Heart disease Neg Hx   . Cancer Neg Hx    Past Surgical History:  Procedure Laterality Date  . APPENDECTOMY  2001  . CESAREAN SECTION  2010     [name], [name], MD 03/06/17 [phone]

## 2017-03-14 ENCOUNTER — Encounter (HOSPITAL_COMMUNITY): Payer: Self-pay

## 2017-03-14 ENCOUNTER — Emergency Department (HOSPITAL_COMMUNITY)
Admission: EM | Admit: 2017-03-14 | Discharge: 2017-03-14 | Disposition: A | Discharge status: Home / Self Care | Payer: 59 | Attending: Emergency Medicine | Admitting: Emergency Medicine

## 2017-03-14 DIAGNOSIS — I10 Essential (primary) hypertension: Secondary | ICD-10-CM | POA: Insufficient documentation

## 2017-03-14 DIAGNOSIS — T781XXA Other adverse food reactions, not elsewhere classified, initial encounter: Secondary | ICD-10-CM | POA: Diagnosis not present

## 2017-03-14 DIAGNOSIS — Z79899 Other long term (current) drug therapy: Secondary | ICD-10-CM | POA: Insufficient documentation

## 2017-03-14 DIAGNOSIS — R21 Rash and other nonspecific skin eruption: Secondary | ICD-10-CM | POA: Diagnosis present

## 2017-03-14 MED ORDER — PREDNISONE 10 MG PO TABS: 20.0000 mg | ORAL_TABLET | Freq: Two times a day (BID) | ORAL | 0 refills | Status: DC

## 2017-03-14 MED ORDER — RANITIDINE HCL 150 MG PO TABS: 150.0000 mg | ORAL_TABLET | Freq: Two times a day (BID) | ORAL | 0 refills | Status: DC

## 2017-03-14 MED ORDER — METHYLPREDNISOLONE SODIUM SUCC 125 MG IJ SOLR
125.0000 mg | Freq: Once | INTRAMUSCULAR | Status: AC
  Administered 2017-03-14: 125 mg via INTRAVENOUS
  Filled 2017-03-14: qty 2

## 2017-03-14 MED ORDER — FAMOTIDINE IN NACL 20-0.9 MG/50ML-% IV SOLN
20.0000 mg | Freq: Once | INTRAVENOUS | Status: AC
  Administered 2017-03-14: 20 mg via INTRAVENOUS
  Filled 2017-03-14: qty 50

## 2017-03-14 MED ORDER — DIPHENHYDRAMINE HCL 50 MG/ML IJ SOLN
25.0000 mg | Freq: Once | INTRAMUSCULAR | Status: AC
  Administered 2017-03-14: 25 mg via INTRAVENOUS
  Filled 2017-03-14: qty 1

## 2017-03-14 MED ORDER — HYDROXYZINE HCL 25 MG PO TABS: 25.0000 mg | ORAL_TABLET | Freq: Four times a day (QID) | ORAL | 0 refills | Status: DC

## 2017-03-14 NOTE — ED Notes (Signed)
Bed: WA11 Expected date:  Expected time:  Means of arrival:  Comments: Hold for triage 2 

## 2017-03-14 NOTE — ED Provider Notes (Signed)
[facility]-EMERGENCY DEPT Provider Note   CSN: [id number] Arrival date & time: 03/14/17  0957     History   Chief Complaint Chief Complaint  Patient presents with  . Allergic Reaction    HPI CHERISH RUNDE is a 36 y.o. female who presents to the ED with rash and itching. Patient states she ate a new sauce last night and about 30 minutes later began itching and noted a rash. She woke at 3 am with shortness of breath. She took one Benadryl that helped the itching but not the rash. Patient continues to feel short of breath.   HPI  Past Medical History:  Diagnosis Date  . Anemia   . 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 On chronic narcotics for this.  . Hypertension   . Lupus    Followed by Rheum. On chronic Prednisone.    Patient Active Problem List   Diagnosis Date Noted  . Tick bite 12/12/2016  . Greater trochanteric bursitis of right hip 08/15/2016  . 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 Dysfunctional uterine bleeding 12/02/2014  . Heart murmur 11/15/2013  . Chronic right hip pain 02/06/2009  . Avascular necrosis of femur head, right (HCC) 08/28/2006  . HYPERTENSION, BENIGN SYSTEMIC 08/21/2006  . SLE 08/21/2006  . Proteinuria 08/21/2006    Past Surgical History:  Procedure Laterality Date  . APPENDECTOMY  2001  . CESAREAN SECTION  2010    OB History    Gravida Para Term Preterm AB Living   SAB TAB Ectopic Multiple Live Births   1       2       Home Medications    Prior to Admission medications   Medication Sig Start Date End Date Taking? Authorizing Provider  HYDROcodone-acetaminophen (NORCO) 10-325 MG tablet Take 1 tablet by mouth every 6 (six) hours as needed for severe pain. 12/17/16  Yes [name], Janine Ores, MD  medroxyPROGESTERone (DEPO-PROVERA) 150 MG/ML injection Inject 150 mg into the muscle every 3 (three) months.   Yes [provider]  hydrOXYzine (ATARAX/VISTARIL) 25 MG tablet Take 1  tablet (25 mg total) by mouth every 6 (six) hours. 03/14/17   Janne Napoleon, NP  predniSONE (DELTASONE) 10 MG tablet Take 2 tablets (20 mg total) by mouth 2 (two) times daily with a meal. 03/14/17   Damian Leavell, [place], NP  ranitidine (ZANTAC) 150 MG tablet Take 1 tablet (150 mg total) by mouth 2 (two) times daily. 03/14/17   Janne Napoleon, NP    Family History Family History  Problem Relation Age of Onset  . Hypertension Father   . Hypertension Maternal Grandmother   . Hypertension Paternal Grandmother   . Stroke Neg Hx   . Heart disease Neg Hx   . Cancer Neg Hx     Social History Social History  Substance Use Topics  . Smoking status: Never Smoker  . Smokeless tobacco: Never Used  . Alcohol use No     Allergies   Cinnamon and Penicillins   Review of Systems Review of Systems  Constitutional: Negative for chills and fever.  HENT: Negative for congestion, ear pain, sore throat and trouble swallowing.   Eyes: Positive for itching. Negative for visual disturbance.  Respiratory: Positive for shortness of breath. Negative for chest tightness.   Cardiovascular: Negative for chest pain.  Gastrointestinal: Positive for nausea. Negative for abdominal pain and vomiting.  Genitourinary: Negative for difficulty urinating.  Musculoskeletal: Negative for joint swelling.  Skin: Positive for rash.  Allergic/Immunologic: Positive for food allergies.  Neurological: Negative for light-headedness and headaches.  Psychiatric/Behavioral: Negative for confusion.     Physical Exam Updated Vital Signs BP 126/70 (BP Location: Right Arm)   Pulse 70   Temp 97.9 F (36.6 C) (Oral)   Resp 18   Ht  (1.651 m)   Wt 75.8 kg (167 lb)   SpO2 100%   BMI 27.79 kg/m   Physical Exam  Constitutional: She is oriented to person, place, and time. She appears well-developed and well-nourished. No distress.  HENT:  Head: Normocephalic.  Mouth/Throat: Uvula is midline and mucous membranes are normal. No  posterior oropharyngeal edema or posterior oropharyngeal erythema.  Eyes: EOM are normal.  Neck: Normal range of motion. Neck supple.  Cardiovascular: Normal rate and regular rhythm.   Pulmonary/Chest: Effort normal. No respiratory distress. She has no wheezes. She has no rales.  Abdominal: Soft. There is no tenderness.  Musculoskeletal: Normal range of motion.  Neurological: She is alert and oriented to person, place, and time. No cranial nerve deficit.  Skin: Skin is warm and dry. Rash noted.  Generalized hives  Psychiatric: She has a normal mood and affect.  Nursing note and vitals reviewed.    ED Treatments / Results  Labs (all labs ordered are listed, but only abnormal results are displayed) Labs Reviewed - No data to display  EKG  EKG Interpretation  Date/Time:  Friday March 14 2017 11:16:24 EDT Ventricular Rate:  69 PR Interval:    QRS Duration: 99 QT Interval:  380 QTC Calculation: 408 R Axis:   56 Text Interpretation:  Sinus rhythm No significant change since last tracing Confirmed by Linwood Dibbles [phone]) on 03/14/2017 12:58:31 PM       Radiology No results found.  Procedures Procedures (including critical care time)  Medications Ordered in ED Medications  methylPREDNISolone sodium succinate (SOLU-MEDROL) 125 mg/2 mL injection 125 mg (125 mg Intravenous Given 03/14/17 1354)  famotidine (PEPCID) IVPB 20 mg premix (20 mg Intravenous New Bag/Given 03/14/17 1355)  diphenhydrAMINE (BENADRYL) injection 25 mg (25 mg Intravenous Given 03/14/17 1355)   3:00 pm re examined after medications. Rash is less and patient reports no shortness of breath and no itching. Stable for d/c without respiratory distress and O2 SAT 100% on R/A. Return precautions discussed.   Initial Impression / Assessment and Plan / ED Course  I have reviewed the triage vital signs and the nursing notes.   Final Clinical Impressions(s) / ED Diagnoses   Final diagnoses:  Allergic reaction to  food, initial encounter    New Prescriptions New Prescriptions   HYDROXYZINE (ATARAX/VISTARIL) 25 MG TABLET    Take 1 tablet (25 mg total) by mouth every 6 (six) hours.   PREDNISONE (DELTASONE) 10 MG TABLET    Take 2 tablets (20 mg total) by mouth 2 (two) times daily with a meal.   RANITIDINE (ZANTAC) 150 MG TABLET    Take 1 tablet (150 mg total) by mouth 2 (two) times daily.     Kerrie Buffalo [place], [place] 03/14/17 Janetta Hora    Linwood Dibbles, MD 03/17/17 [phone]

## 2017-03-14 NOTE — ED Triage Notes (Signed)
Patient states she ate some [place] sauce from [employer] last night and felt like she was having an allergic reaction and then around 0800 today, patient had rash on face neck, back, and chest , feeling like throat is closing, SOB,and itching. Patient took a Benadryl at 0800 today, but not working.

## 2017-03-14 NOTE — ED Notes (Signed)
Bed: WTR6 Expected date:  Expected time:  Means of arrival:  Comments: 

## 2017-03-14 NOTE — Discharge Instructions (Signed)
Return if symptoms worsen

## 2017-03-18 ENCOUNTER — Encounter: Payer: Self-pay | Admitting: Internal Medicine

## 2017-03-18 ENCOUNTER — Ambulatory Visit (INDEPENDENT_AMBULATORY_CARE_PROVIDER_SITE_OTHER): Payer: 59 | Admitting: Internal Medicine

## 2017-03-18 VITALS — BP 140/88 | HR 77 | Temp 97.8°F | Ht 65.0 in | Wt 165.0 lb

## 2017-03-18 DIAGNOSIS — G894 Chronic pain syndrome: Secondary | ICD-10-CM

## 2017-03-18 DIAGNOSIS — M25551 Pain in right hip: Secondary | ICD-10-CM | POA: Diagnosis not present

## 2017-03-18 DIAGNOSIS — Z309 Encounter for contraceptive management, unspecified: Secondary | ICD-10-CM | POA: Diagnosis not present

## 2017-03-18 DIAGNOSIS — G8929 Other chronic pain: Secondary | ICD-10-CM | POA: Diagnosis not present

## 2017-03-18 LAB — POCT URINE PREGNANCY: Preg Test, Ur: NEGATIVE

## 2017-03-18 MED ORDER — GABAPENTIN 300 MG PO CAPS: 300.0000 mg | ORAL_CAPSULE | Freq: Every day | ORAL | 1 refills | Status: DC

## 2017-03-18 MED ORDER — HYDROCODONE-ACETAMINOPHEN 10-325 MG PO TABS: 1.0000 | ORAL_TABLET | Freq: Four times a day (QID) | ORAL | 0 refills | Status: DC | PRN

## 2017-03-18 MED ORDER — MEDROXYPROGESTERONE ACETATE 150 MG/ML IM SUSY
150.0000 mg | PREFILLED_SYRINGE | Freq: Once | INTRAMUSCULAR | Status: AC
  Administered 2017-03-18: 150 mg via INTRAMUSCULAR

## 2017-03-18 NOTE — Patient Instructions (Addendum)
Ms. Kawabata,  I recommend trying gabapentin 300 mg at night to help with the shooting pains. You can take this during the day, as well, if it does not make you groggy. You can take this up to 3 times a day. It may help you wean down on your other pain medication.   Please see me back in 3 months or sooner as needed.  Best, Dr. Sampson Goon

## 2017-03-21 ENCOUNTER — Encounter: Payer: Self-pay | Admitting: Internal Medicine

## 2017-03-21 NOTE — Assessment & Plan Note (Signed)
-   U preg negative. Depo administered.  

## 2017-03-21 NOTE — Progress Notes (Signed)
[name] Progress Note  Subjective:  JERONDA DON is a 36 y.o. female with history of lupus previously on chronic prednisone, HTN, and bilateral avascular necrosis with chronic pain who presents to discuss chronic pain and for depo shot.   #Chronic pain: - Known bilateral AVN. Possibly 2/2 long-term steroid use for lupus. Now not on treatment for lupus.  - Both hips used to hurt her, but now only right is the issue. - Works at [employer] and is walking constantly. - Would like to decrease pain medication but limited by pain when she has tried to taper in the past. In addition, cold weather makes pain worse (October-January) - Trying to avoid surgery for as long as she can - Reports radiating burning pain down her R leg especially at night - Has been on hydrocodone-acetaminophen 10-325 mg q6h prn, #120 per month, and able to stay active ROS: No falls, no fever  #Depo shot  - Overdue; last shot in March - Would like to continue this method of contraception   Allergies  Allergen Reactions  . Cinnamon Swelling  . Penicillins Hives and Swelling    Has patient had a PCN reaction causing immediate rash, facial/tongue/throat swelling, SOB or lightheadedness with hypotension: Yes Has patient had a PCN reaction causing severe rash involving mucus membranes or skin necrosis: No Has patient had a PCN reaction that required hospitalization: No Has patient had a PCN reaction occurring within the last 10 years: No If all of the above answers are "NO", then may proceed with Cephalosporin use.    Social: Never smoker  Objective: Blood pressure 140/88, pulse 77, temperature 97.8 F (36.6 C), temperature source Oral, height  (1.651 m), weight 165 lb (74.8 kg), SpO2 99 %. Body mass index is 27.46 kg/m. Constitutional: Overweight female in NAD, very pleasant Cardiovascular: RRR, S1, S2, no m/r/g.  Pulmonary/Chest: Effort normal and breath sounds normal.  Musculoskeletal:  Limited ROM due to pain with internal rotation of R hip. Discomfort with internal rotation of L hip but FROM. No pain over greater trochanter bilaterally. No midline spinal TTP. Skin: Skin is warm and dry. No rash noted. No erythema.  Psychiatric: Normal mood and affect.  Vitals reviewed  Assessment/Plan: Chronic right hip pain - Stable on current regimen, which allows patient to remain active.  - Goal is to eventually taper medication, but coming into the colder months that exacerbate patient's pain, this is not the best time to do so. However, patient to continue to use medication prn with attempts to cut back. Discussed she may need surgery in order to come off of medication. - Will add gabapentin, to start 300 mg QHS for nerve type pain of R leg. Tylenol prn.  Indication for chronic opioid: R hip pain 2/2 AVN Medication and dose: norco 10-325 mg # pills per month: 120 Last UDS date: 06/2015; will order today Pain contract signed (Y/N): Y Date narcotic database last reviewed (include red flags): 03/18/17; appropriate   Encounter for contraceptive management - U preg negative. Depo administered.  Follow-up in 3 months for continued discussion of pain managmenet.  Dani Gobble, MD [name], PGY-3

## 2017-03-21 NOTE — Assessment & Plan Note (Addendum)
-   Stable on current regimen, which allows patient to remain active.  - Goal is to eventually taper medication, but coming into the colder months that exacerbate patient's pain, this is not the best time to do so. However, patient to continue to use medication prn with attempts to cut back. Discussed she may need surgery in order to come off of medication. - Will add gabapentin, to start 300 mg QHS for nerve type pain of R leg. Tylenol prn.  Indication for chronic opioid: R hip pain 2/2 AVN Medication and dose: norco 10-325 mg # pills per month: 120 Last UDS date: 06/2015; will order today Pain contract signed (Y/N): Y Date narcotic database last reviewed (include red flags): 03/18/17; appropriate

## 2017-03-22 LAB — TOXASSURE SELECT 13 (MW), URINE

## 2017-03-28 ENCOUNTER — Telehealth: Payer: Self-pay | Admitting: Internal Medicine

## 2017-03-28 NOTE — Telephone Encounter (Signed)
I would recommend using a dandruff shampoo like Head&Shoulders or selsun blue. Please inform patient of these over-the-counter options. Thank you.  Dani Gobble, MD [name], PGY-3

## 2017-03-28 NOTE — Telephone Encounter (Signed)
Pt is calling because she has dandruff very bad, her stylist told her that the is a pill that the doctor can prescribe to help with this. She would like to try this to see of this can help her. If uou have any questions please call. jw

## 2017-04-03 NOTE — Telephone Encounter (Signed)
Left message for patient to call back  

## 2017-04-10 NOTE — Telephone Encounter (Signed)
Pt says she has tried the OTC options. She wants the dandruff pill. Please advise

## 2017-04-11 ENCOUNTER — Other Ambulatory Visit: Payer: Self-pay | Admitting: Internal Medicine

## 2017-04-11 MED ORDER — KETOCONAZOLE 2 % EX SHAM: 1.0000 "application " | MEDICATED_SHAMPOO | CUTANEOUS | 0 refills | Status: DC

## 2017-04-11 NOTE — Progress Notes (Signed)
Please inform patient that there is a not a dandruff pill but there is prescription strength dandruff shampoos if the OTC options have not worked in the past. I have prescribed one of these for her to try. If this does not help with the problem, she will need to come in for an appointment.   Marcy Sirenatherine Wallace, D.O. 04/11/2017, 3:47 PM PGY-3, [facility]

## 2017-04-15 NOTE — Progress Notes (Signed)
Attempted to call pt no answer. Deseree Blount, CMA  

## 2017-05-01 IMAGING — CR DG HIP (WITH OR WITHOUT PELVIS) 2-3V*R*
3 series · 3 of 3 positions shown · non-contrast
Comparison: None.

CLINICAL DATA: Right hip pain.  Initial evaluation .

EXAM:
DG HIP (WITH OR WITHOUT PELVIS) 2-3V RIGHT

[w pelvis upright]
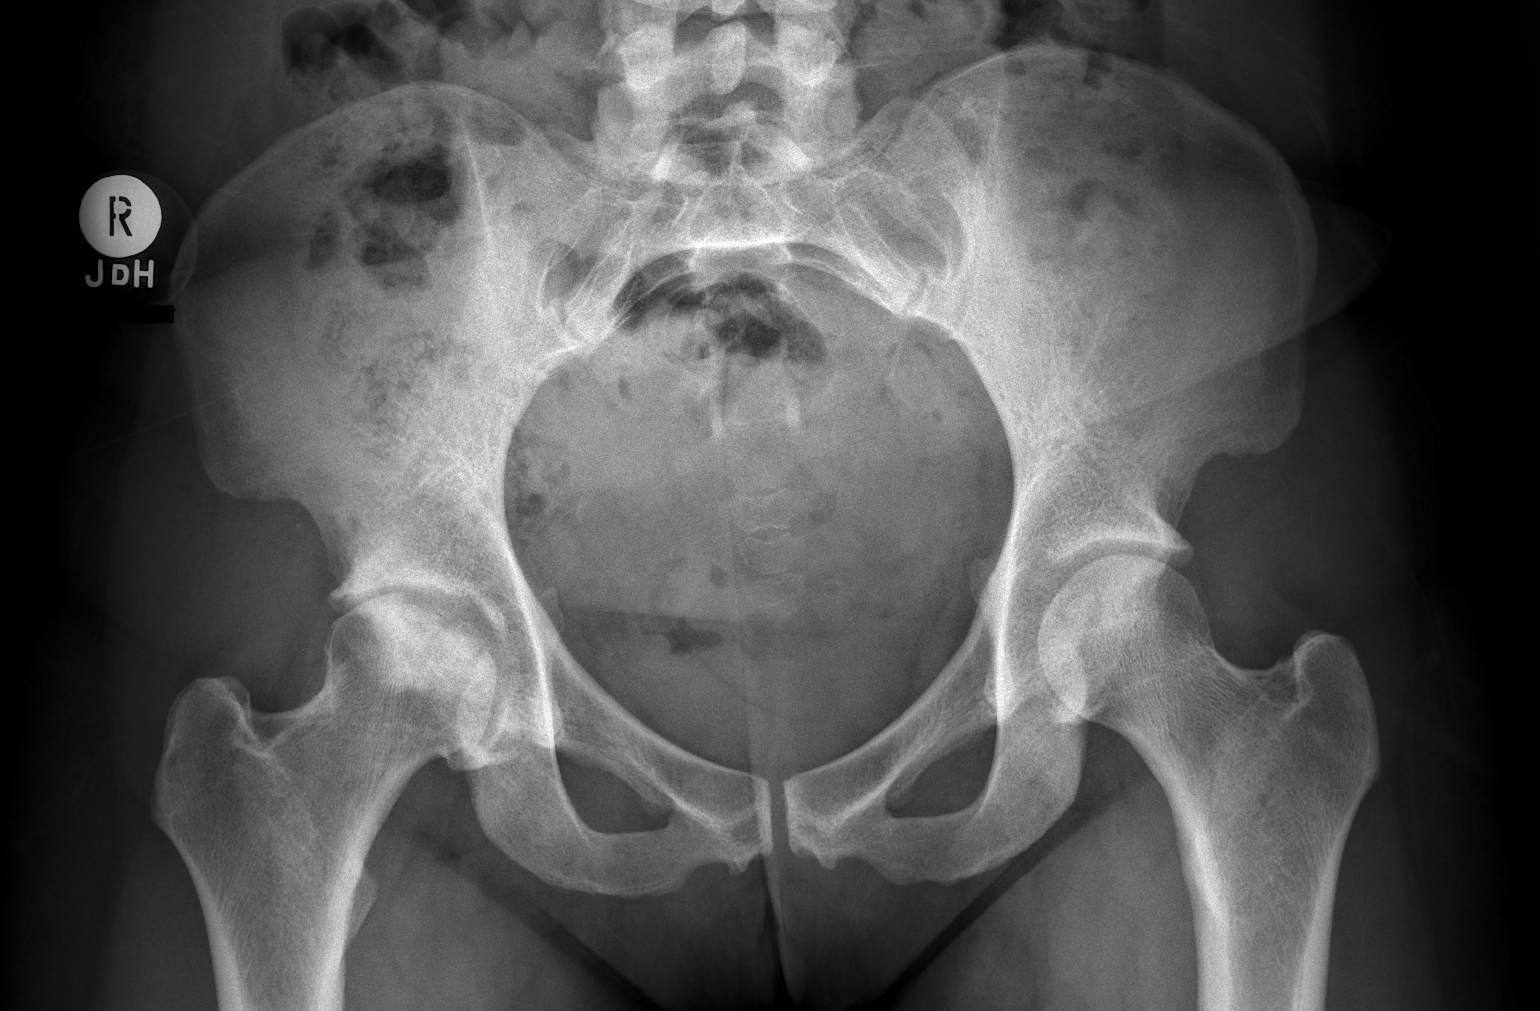

[w hip ap right]
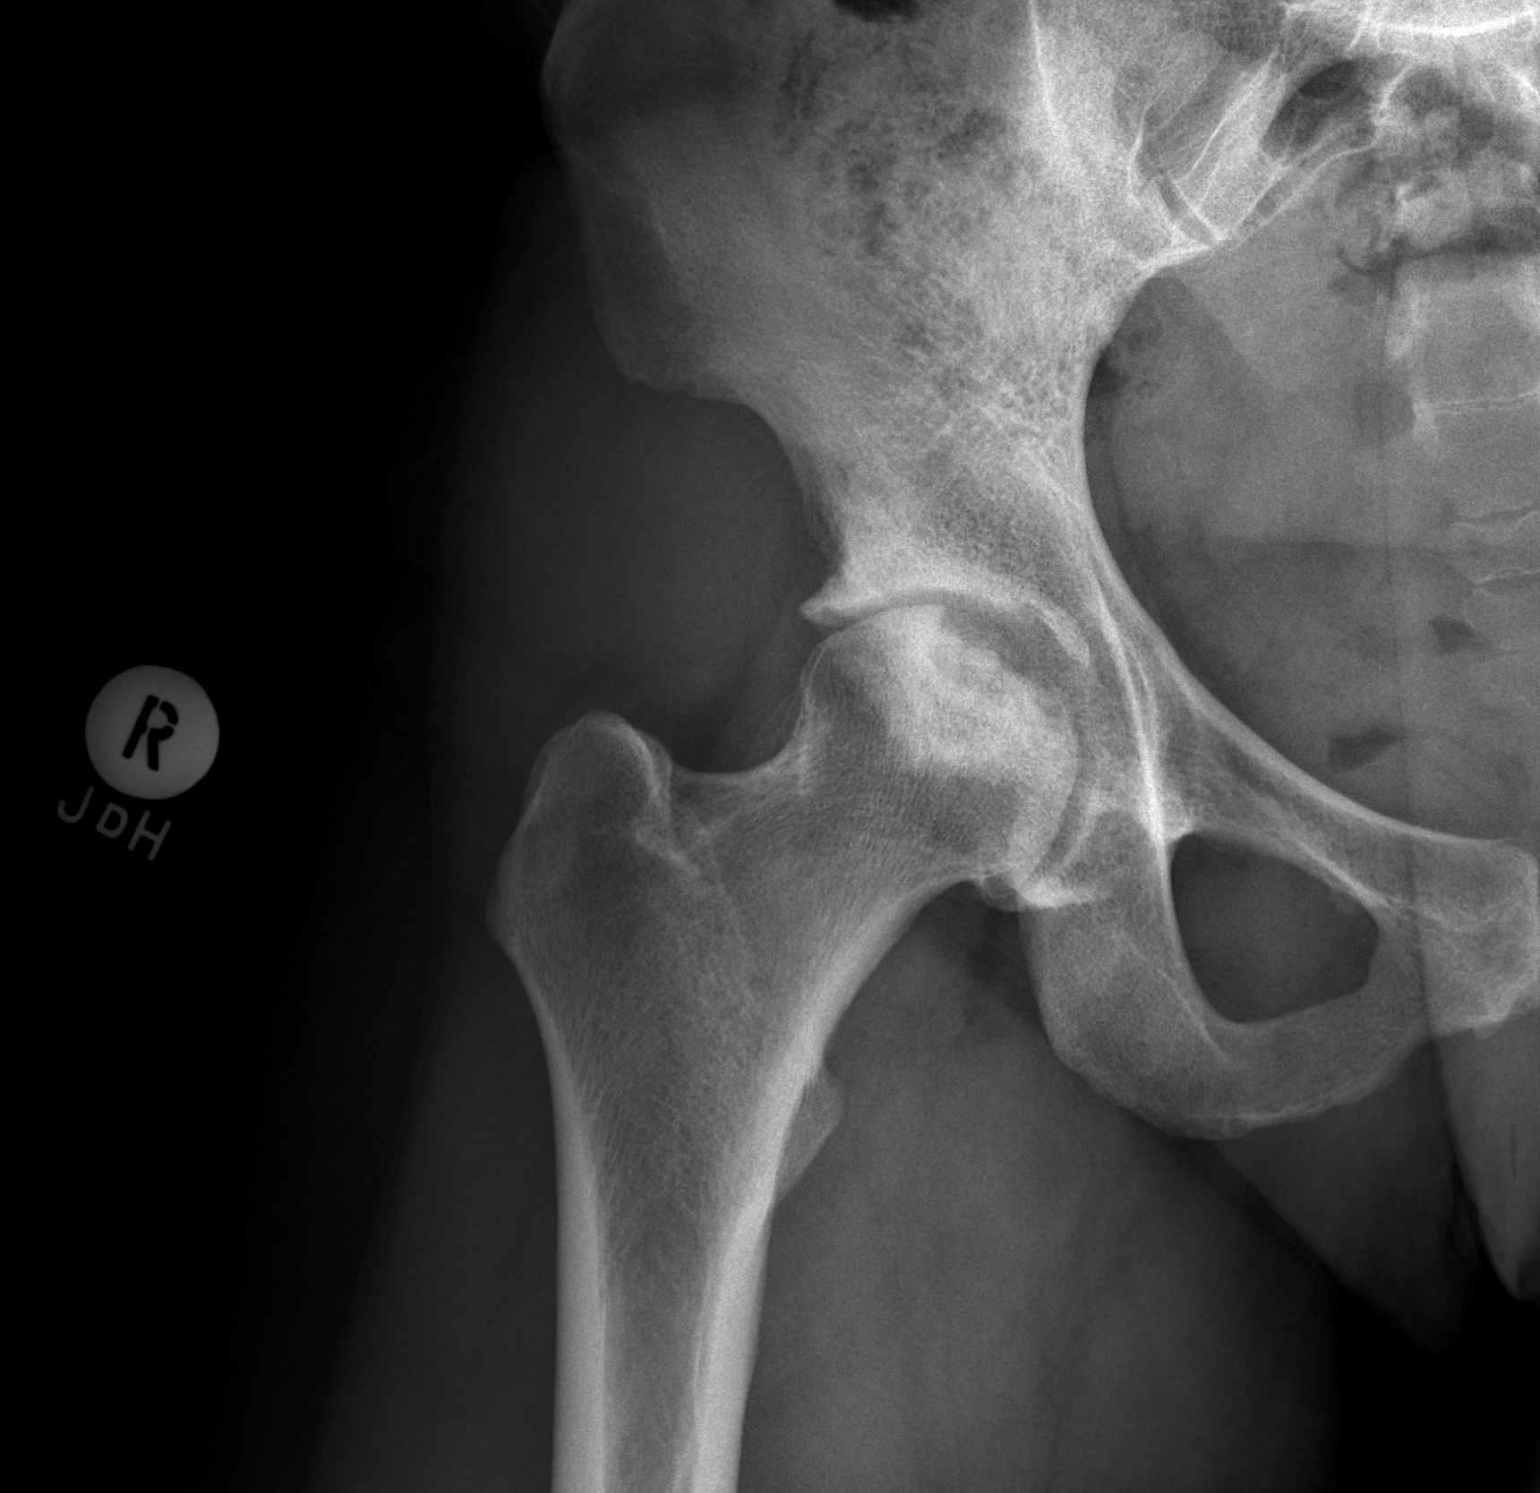

[w hip frog right]
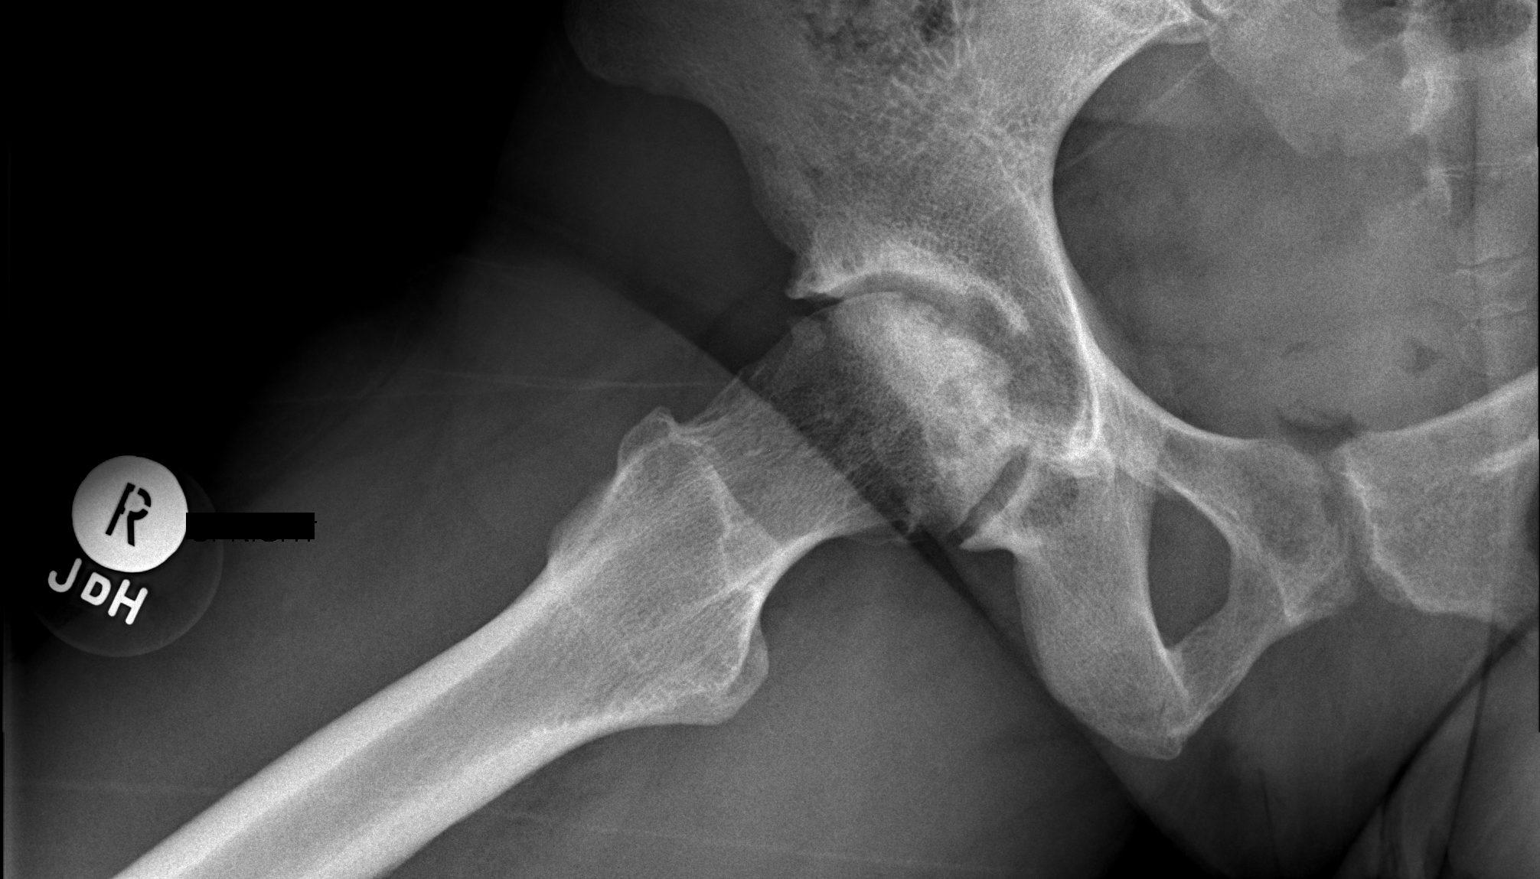

[3 of 3 positions shown; findings below may reference images not displayed]

FINDINGS: Sclerotic changes noted of the right femoral head. Associated
deformity with femoral head collapse noted. These findings are
consistent with avascular necrosis. Infection cannot be completely
excluded. Subtle sclerotic changes in the left femoral head.
Avascular necrosis of the left femoral head cannot be excluded . No
fracture. No evidence of dislocation.
IMPRESSION: 1. Severe changes of avascular necrosis right femoral head.
2. Subtle changes of avascular necrosis left femoral head cannot be
excluded.
MRI can be obtained to further evaluate as needed.

## 2017-05-05 ENCOUNTER — Ambulatory Visit: Payer: 59 | Admitting: Family Medicine

## 2017-05-08 ENCOUNTER — Ambulatory Visit: Payer: 59 | Admitting: Internal Medicine

## 2017-06-18 ENCOUNTER — Ambulatory Visit: Payer: Self-pay

## 2017-06-20 ENCOUNTER — Other Ambulatory Visit: Payer: Self-pay

## 2017-06-20 ENCOUNTER — Ambulatory Visit: Payer: 59 | Admitting: Internal Medicine

## 2017-06-20 ENCOUNTER — Encounter: Payer: Self-pay | Admitting: Internal Medicine

## 2017-06-20 VITALS — BP 140/80 | Temp 97.6°F | Wt 162.4 lb

## 2017-06-20 DIAGNOSIS — L659 Nonscarring hair loss, unspecified: Secondary | ICD-10-CM | POA: Diagnosis not present

## 2017-06-20 DIAGNOSIS — Z309 Encounter for contraceptive management, unspecified: Secondary | ICD-10-CM | POA: Diagnosis not present

## 2017-06-20 DIAGNOSIS — R590 Localized enlarged lymph nodes: Secondary | ICD-10-CM

## 2017-06-20 DIAGNOSIS — N6002 Solitary cyst of left breast: Secondary | ICD-10-CM

## 2017-06-20 DIAGNOSIS — G8929 Other chronic pain: Secondary | ICD-10-CM | POA: Diagnosis not present

## 2017-06-20 DIAGNOSIS — M25551 Pain in right hip: Secondary | ICD-10-CM

## 2017-06-20 LAB — POCT URINE PREGNANCY: Preg Test, Ur: NEGATIVE

## 2017-06-20 MED ORDER — HYDROCODONE-ACETAMINOPHEN 10-325 MG PO TABS: 1.0000 | ORAL_TABLET | Freq: Four times a day (QID) | ORAL | 0 refills | Status: DC | PRN

## 2017-06-20 MED ORDER — MEDROXYPROGESTERONE ACETATE 150 MG/ML IM SUSY
150.0000 mg | PREFILLED_SYRINGE | Freq: Once | INTRAMUSCULAR | Status: AC
  Administered 2017-06-20: 150 mg via INTRAMUSCULAR

## 2017-06-20 NOTE — Patient Instructions (Addendum)
Ms. Arlana Pouchate,  Please see me back in 2-3 months to see how your pain is doing.  Continue gabapentin at night and take ibuprofen or tylenol as needed.  Restarting biotin may help with hair growth.   See me back in 2 weeks to follow-up armpit lump. It is likely an inflamed sweat glad. We'll get you scheduled for a breast ultrasound to follow-up cyst.  Best,  Dr. Sampson GoonFitzgerald

## 2017-06-20 NOTE — Progress Notes (Signed)
[facility] Progress Note  Subjective:  Angela Walls is a 36 y.o. female with history of lupus previously on chronic prednisone, HTN, and bilateral avascular necrosis with chronic pain who presents to discuss chronic pain, hair changes, swollen underarm area, and for depo shot.   #Chronic pain: - Cold weather months are the worst for her - Taking oxycodone four times a day and can tell when she is due for medication despite trying to space things out by taking aleve - Had a fall at work when a step collapsed and getting PT and seeing Orthopedics for this -- affecting left shoulder not her hips - Not needing any assistance to walk - Would like to wean down her medications but does not feel she can do so during winter months  #Hair loss: - Says her hairdresser is worried about alopecia - Has tried dandruff shampoos for flaking but hairdresser still notes this - Thinks could be due to stress given her chronic pain  #Bump under L arm: - Noticed 2-3 weeks ago - on depo so not having periods - somewhat tender but thinks is getting smaller - a little worried because she has a history of cyst of L breast; present for years but unchanged - No history of breast cancer in the family except maybe for a great grandmother ROS: No nipple discharge, no skin changes of breast  #Due for depo - last administered 03/18/17   Allergies  Allergen Reactions  . Cinnamon Swelling  . Penicillins Hives and Swelling    Has patient had a PCN reaction causing immediate rash, facial/tongue/throat swelling, SOB or lightheadedness with hypotension: Yes Has patient had a PCN reaction causing severe rash involving mucus membranes or skin necrosis: No Has patient had a PCN reaction that required hospitalization: No Has patient had a PCN reaction occurring within the last 10 years: No If all of the above answers are "NO", then may proceed with Cephalosporin use.     Social History   Tobacco  Use  . Smoking status: Never Smoker  . Smokeless tobacco: Never Used  Substance Use Topics  . Alcohol use: No    Objective: Blood pressure 140/80, temperature 97.6 F (36.4 C), temperature source Oral, weight 162 lb 6.4 oz (73.7 kg). Body mass index is 27.02 kg/m. Constitutional: Overweight female in NAD HENT: Thinner hair across crown of scalp but follicles/new growth noted. No nodules.  Musculoskeletal: Able to ambulate without assistance. Normal gait.  Skin: Breast and axillae examined with chaperone present. ~2 cm mobile, mildly tender nodule under L axillae without overlying skin changes. Some fullness of R axillae but no distinctive nodules. Large area of firmness (~2 cm) at 3 o'clock of L breast (patient says this feels unchanged to her).  Psychiatric: Normal mood and affect.  Vitals reviewed  Imaging reviewed. Patient had ultrasounds of left breast in 2009 and 2010 for 5 mm lump at 12 o'clock position. 2010 US reported resolution of subcutaneous nodule and regular mammograms starting at age 35.   Assessment/Plan: Chronic right hip pain - Worse in winter. Current regimen allows patient to remain active.  - Encouraged patient to continue gabapentin 300 mg QHS and tylenol prn to try to decrease usage of narcotics. - Would like to avoid surgery for as long as possible, but last xray of R hip showed severe changes of AVN. Consider repeat imaging at follow-up if still requiring q6h pain medication despite warming of weather.    Indication for chronic opioid: R  hip pain 2/2 AVN Medication and dose: norco 10-325 mg # pills per month: 120 Last UDS date: 02/2017; consider repeat at next visit as was not positive for opioids Pain contract signed (Y/N): Y Date narcotic database last reviewed (include red flags): 06/20/17; appropriate  Cyst of left breast - Precepted with [name]. Patient reports this is chronic but last US available said this had resolved and notes location at 12  o'clock not 3 o'clock as on today's exam. - Ordered f/u US. May ultimately need mammogram given age.   Axillary lymphadenopathy - Mobile, tender, and present only for 2-3 weeks. Suspect inflamed apocrine gland. Will continue to monitor. Anticipate spontaneous resolution. Given presence of L breast mass, however, will include axilla in repeat US.   Hair loss - Did not note any areas without hair growth.  - Suggested resuming biotin.   Encounter for contraceptive management - U preg negative. Depo administered.   Follow-up in a few weeks to assess for resolution of lymphadenopathy.  Dani GobbleHillary Fitzgerald, MD [facility], PGY-3

## 2017-06-23 ENCOUNTER — Encounter: Payer: Self-pay | Admitting: Internal Medicine

## 2017-06-23 DIAGNOSIS — L659 Nonscarring hair loss, unspecified: Secondary | ICD-10-CM | POA: Insufficient documentation

## 2017-06-23 DIAGNOSIS — R590 Localized enlarged lymph nodes: Secondary | ICD-10-CM | POA: Insufficient documentation

## 2017-06-23 DIAGNOSIS — N6002 Solitary cyst of left breast: Secondary | ICD-10-CM | POA: Insufficient documentation

## 2017-06-23 NOTE — Assessment & Plan Note (Signed)
-   U preg negative. Depo administered.

## 2017-06-23 NOTE — Assessment & Plan Note (Addendum)
-   Worse in winter. Current regimen allows patient to remain active.  - Encouraged patient to continue gabapentin 300 mg QHS and tylenol prn to try to decrease usage of narcotics. - Would like to avoid surgery for as long as possible, but last xray of R hip showed severe changes of AVN. Consider repeat imaging at follow-up if still requiring q6h pain medication despite warming of weather.    Indication for chronic opioid: R hip pain 2/2 AVN Medication and dose: norco 10-325 mg # pills per month: 120 Last UDS date: 02/2017; consider repeat at next visit as was not positive for opioids Pain contract signed (Y/N): Y Date narcotic database last reviewed (include red flags): 06/20/17; appropriate

## 2017-06-23 NOTE — Assessment & Plan Note (Signed)
-   Precepted with [name]. Patient reports this is chronic but last US available said this had resolved and notes location at 12 o'clock not 3 o'clock as on today's exam. - Ordered f/u US. May ultimately need mammogram given age.

## 2017-06-23 NOTE — Assessment & Plan Note (Signed)
-   Did not note any areas without hair growth.  - Suggested resuming biotin.

## 2017-06-23 NOTE — Assessment & Plan Note (Signed)
-   Mobile, tender, and present only for 2-3 weeks. Suspect inflamed apocrine gland. Will continue to monitor. Anticipate spontaneous resolution. Given presence of L breast mass, however, will include axilla in repeat US.

## 2017-06-26 ENCOUNTER — Telehealth: Payer: Self-pay | Admitting: Internal Medicine

## 2017-06-26 NOTE — Telephone Encounter (Signed)
Assist with loan form dropped off for at front desk for completion.  Verified that patient section of form has been completed.  Last DOS with PCP was 06/20/17.  Placed form in red team folder to be completed by clinical staff.  Angela Walls

## 2017-06-26 NOTE — Telephone Encounter (Signed)
Clinical info completed on loan form.  Place form in Dr. Jarrett AblesFitzgerald's box for completion.  Angela BeamHARTSELL,  JAZMIN, CMA

## 2017-06-27 ENCOUNTER — Other Ambulatory Visit: Payer: Self-pay | Admitting: Internal Medicine

## 2017-06-27 DIAGNOSIS — N6002 Solitary cyst of left breast: Secondary | ICD-10-CM

## 2017-06-27 NOTE — Telephone Encounter (Signed)
Left patient a message that she will need this form completed by Orthopedics, as they are seeing her for the shoulder issue that is limiting her ability to work. Will leave form up front for patient.  Dani GobbleHillary Natayla Cadenhead, MD [facility], PGY-3

## 2017-07-01 ENCOUNTER — Other Ambulatory Visit: Payer: Self-pay

## 2017-07-04 ENCOUNTER — Ambulatory Visit: Payer: Self-pay | Admitting: Internal Medicine

## 2017-07-09 ENCOUNTER — Telehealth: Payer: Self-pay | Admitting: Internal Medicine

## 2017-07-09 NOTE — Telephone Encounter (Signed)
Left patient voicemail saying the process for applying for long-term/permanent disability must be initiating through Office of Social Security, which will then refer her to an occupational doctor to make this assessment.

## 2017-07-09 NOTE — Telephone Encounter (Signed)
Spoke with Dr. Lum BabeEniola regarding disability paperwork. She confirms we do not make those determinations here. Left message for patient that she would need to start the process by going to the Social Security office; they would refer her to an occupational health doctor who would make the determination. Will leave incomplete paperwork upfront for patient.   Dani GobbleHillary Fitzgerald, MD [facility], PGY-3

## 2017-07-09 NOTE — Telephone Encounter (Signed)
I called patient twice to discuss her disability request but I was unable to reach her. HIPPA compliant message left for her to call me back.

## 2017-07-09 NOTE — Telephone Encounter (Signed)
This pt is returning Fitzgeralds call, she only wants to speak to her.

## 2017-07-09 NOTE — Telephone Encounter (Signed)
Pt called about her form. She wants the form filled out regarding her lupus not her shoulder problem. Please complete form and let pt know when she can pick it up

## 2017-07-09 NOTE — Telephone Encounter (Signed)
Spoke with patient. She requests to talk to attending who said [facility] does not complete disability forms. She agrees that she is able to work and currently is working. However, says these forms have been filled out by our practice in the past because she has lupus. Gave name of resident who may have been at [facility] in the '90s. Reiterated that form states patient cannot do any meaningful work, which is false. Upon reviewing patient's chart with Dr. Lum BabeEniola, patient has been told we cannot do these forms at least in 2015. Dr. Lum BabeEniola attempted to call patient, but there was no answer. Dr. Lum BabeEniola requests patient's call be directed to her if she calls back.  Dani GobbleHillary Fitzgerald, MD [facility], PGY-3

## 2017-07-11 ENCOUNTER — Inpatient Hospital Stay: Admission: RE | Admit: 2017-07-11 | Payer: Self-pay | Source: Ambulatory Visit

## 2017-08-27 ENCOUNTER — Ambulatory Visit: Payer: Self-pay | Admitting: Internal Medicine

## 2017-08-28 ENCOUNTER — Ambulatory Visit: Payer: 59 | Admitting: Internal Medicine

## 2017-09-11 ENCOUNTER — Ambulatory Visit (INDEPENDENT_AMBULATORY_CARE_PROVIDER_SITE_OTHER): Payer: 59

## 2017-09-11 DIAGNOSIS — Z3042 Encounter for surveillance of injectable contraceptive: Secondary | ICD-10-CM

## 2017-09-11 MED ORDER — MEDROXYPROGESTERONE ACETATE 150 MG/ML IM SUSY
150.0000 mg | PREFILLED_SYRINGE | Freq: Once | INTRAMUSCULAR | Status: AC
Start: 2017-09-11 — End: 2017-09-11
  Administered 2017-09-11: 150 mg via INTRAMUSCULAR

## 2017-09-11 NOTE — Progress Notes (Signed)
   Patient here within dates for Depo Provera injection. Depo given today RUOQ. Site unremarkable & patient tolerated injection well. Next injection due 12/07/17 -12/11/17.  Reminder card given.   Ples SpecterAlisa Vasiliy Mccarry, RN [facility] RN)

## 2017-09-18 ENCOUNTER — Ambulatory Visit (INDEPENDENT_AMBULATORY_CARE_PROVIDER_SITE_OTHER): Payer: 59 | Admitting: Internal Medicine

## 2017-09-18 ENCOUNTER — Encounter: Payer: Self-pay | Admitting: Internal Medicine

## 2017-09-18 VITALS — BP 136/78 | HR 78 | Temp 98.3°F | Ht 65.0 in | Wt 159.2 lb

## 2017-09-18 DIAGNOSIS — G8929 Other chronic pain: Secondary | ICD-10-CM | POA: Diagnosis not present

## 2017-09-18 DIAGNOSIS — M25551 Pain in right hip: Secondary | ICD-10-CM | POA: Diagnosis not present

## 2017-09-18 DIAGNOSIS — N6002 Solitary cyst of left breast: Secondary | ICD-10-CM | POA: Diagnosis not present

## 2017-09-18 MED ORDER — HYDROCODONE-ACETAMINOPHEN 10-325 MG PO TABS: 1.0000 | ORAL_TABLET | Freq: Four times a day (QID) | ORAL | 0 refills | Status: DC | PRN

## 2017-09-18 NOTE — Patient Instructions (Signed)
Ms. Arlana Pouchate,  I will be on the lookout for your mammogram and ultrasound results next week.  Please restart gabapentin at bedtime given your nerve pain.  I would recommend starting a taper of the norco slowly as you are able to be off your feet more at this time.  Please see me back in June and for depo unless anything comes up in the meantime.  Best, Dr. Sampson GoonFitzgerald

## 2017-09-19 NOTE — Progress Notes (Signed)
[name] Progress Note  Subjective:  RUTHANNE MCNEISH is a 37 y.o. with history of lupus previously on chronic prednisone, HTN, and bilateral avascular necrosis with chronic pain who presents for chronic pain and left breast/axillary mass.   #Chronic pain: - Hip pain somewhat improved since being out of work and off her feet (works at [employer]) due to [employer] comp for shoulder injury after a fall at the end of last year - Taking 10 mg norco four times a day most days.  - R hip pain continues to be far worse than left. Reports she has good days and bad days. - Last UDS negative, as patient had run out of medication.  - She is agreeable to taper and thinks this may be a good time to try to reduce pain medication since she is not working currently - She took herself off gabapentin about 3 weeks ago, as she thought it was making her drowsy.  - She reports she may start PT for left shoulder if covered by worker's comp. She denies relief from course of prednisone. She says she may see a spine doctor because of concern for nerve compression of left shoulder. Currently seeing orthopedics, Dr. Dion Saucier.  - Not interested in hip surgery at this time ROS: Denies constipation, falls  #Breast Mass: - Breast mass still present though patient thinks this is same as noted many years ago that per [place] was resolved and at 12 o'clock rather than 3 o'clock - Axillary lypmh node enlargement noted on recent MRI that was part of shoulder workup (results not readily accessible). Patient thinks this area feels smaller than it was at end of last year, however. Denies pain in the area.  - Reports she did not schedule [place] or mammogram, which were ordered at last visit, as she was afraid it would hurt her shoulder   Allergies  Allergen Reactions  . Cinnamon Swelling  . Penicillins Hives and Swelling    Has patient had a PCN reaction causing immediate rash, facial/tongue/throat swelling, SOB or lightheadedness  with hypotension: Yes Has patient had a PCN reaction causing severe rash involving mucus membranes or skin necrosis: No Has patient had a PCN reaction that required hospitalization: No Has patient had a PCN reaction occurring within the last 10 years: No If all of the above answers are "NO", then may proceed with Cephalosporin use.     Social History   Tobacco Use  . Smoking status: Never Smoker  . Smokeless tobacco: Never Used  Substance Use Topics  . Alcohol use: No    Objective: Blood pressure 136/78, pulse 78, temperature 98.3 F (36.8 C), temperature source Oral, height 5\' 5"  (1.651 m), weight 159 lb 3.2 oz (72.2 kg), SpO2 100 %. Body mass index is 26.49 kg/m. Constitutional: Pleasant female in NAD Breast: Chaperone present. ~2 cm mass at 3 o'clock of left breast. Distinct mass >1 cm of left axilla, mobile. Neither area is tender. No overlying skin changes.  Musculoskeletal: Pain with external rotation > internal rotation over right hip. Pain over right greater trochanter. No pain with internal/external rotation of left hip.  Vitals reviewed  Assessment/Plan: Chronic right hip pain - Improved with less time spent on her feet. Norco allows patient to remain active. She is considering looking for another occupation that would not requiring standing.  - Will start taper of norco 10% a month, as patient has had some improvement in pain with more rest and shares goal of weaning down  on pain medication - Recommended restarting gabapentin 300 mg QHS, as this may help with nerve pain of right leg and left shoulder - Tylenol prn  Indication for chronic opioid: R hip pain 2/2 AVN Medication and dose: norco 10-325 mg # pills per month: 110, 100, 90 over next 3 months Last UDS date: 02/2017; negative for opioids (had run out) Pain contract signed (Y/N): Y Date narcotic database last reviewed (include red flags): 09/17/17; appropriate  Cyst of left breast - Diagnostic mammogram and US  of left axilla scheduled for next week.   Follow-up in 3 months for depo shot and chronic pain.  HM: Will be due for pap this spring. Due for TDAP. Has not had basic labwork in a couple years. Would obtain at next visit.   Dani GobbleHillary Fitzgerald, MD [facility], PGY-3

## 2017-09-21 ENCOUNTER — Encounter: Payer: Self-pay | Admitting: Internal Medicine

## 2017-09-21 NOTE — Assessment & Plan Note (Signed)
-   Improved with less time spent on her feet. Norco allows patient to remain active. She is considering looking for another occupation that would not requiring standing.  - Will start taper of norco 10% a month, as patient has had some improvement in pain with more rest and shares goal of weaning down on pain medication - Recommended restarting gabapentin 300 mg QHS, as this may help with nerve pain of right leg and left shoulder - Tylenol prn  Indication for chronic opioid: R hip pain 2/2 AVN Medication and dose: norco 10-325 mg # pills per month: 110, 100, 90 over next 3 months Last UDS date: 02/2017; negative for opioids (had run out) Pain contract signed (Y/N): Y Date narcotic database last reviewed (include red flags): 09/17/17; appropriate

## 2017-09-21 NOTE — Assessment & Plan Note (Addendum)
-   Diagnostic mammogram and US of left axilla scheduled for next week.

## 2017-09-24 ENCOUNTER — Other Ambulatory Visit: Payer: Self-pay

## 2017-09-29 ENCOUNTER — Other Ambulatory Visit: Payer: Self-pay

## 2017-10-22 ENCOUNTER — Telehealth: Payer: Self-pay

## 2017-10-22 DIAGNOSIS — G8929 Other chronic pain: Secondary | ICD-10-CM

## 2017-10-22 DIAGNOSIS — M25551 Pain in right hip: Principal | ICD-10-CM

## 2017-10-22 MED ORDER — HYDROCODONE-ACETAMINOPHEN 10-325 MG PO TABS: 1.0000 | ORAL_TABLET | Freq: Four times a day (QID) | ORAL | 0 refills | Status: DC | PRN

## 2017-10-22 NOTE — Telephone Encounter (Signed)
Pt called back, given information from below message. Pt got upset about needing to make an appointment. States she is having too much pain to try to decrease the amount of pain medicine she is on. Requesting to speak with Dr. Astrid Drafts, RN

## 2017-10-22 NOTE — Telephone Encounter (Signed)
Spoke with patient. She reports increased right hip pain since our last visit. She ran out of norco about a week early with decreased amount of 110 pills versus 120 a month. Hope at last appointment was that pain medication requirement may be less since she is not having to be on her feet as much while she is on worker's comp for shoulder injury. She has not yet filled May's prescription, which was to be a further taper with 100 pills (confirmed with pmp aware website). Will prescribe 120 pills this month. Will order xray of right hip. Patient says she will bring paper scripts for May and June sometime this week to [facility] for [place] to dispose of. Will follow-up with patient once xray has been performed.  Dani Gobble, MD [name], PGY-3

## 2017-10-22 NOTE — Telephone Encounter (Signed)
Pt called, states her pain medicine did not last her 30 days and she was supposed to call MD if this happened. Pt call back [phone] Shawna Orleans, RN

## 2017-10-22 NOTE — Telephone Encounter (Signed)
Attempted to reach patient but got voicemail. If patient calls back, please let her know she should be able to get a refill today based on last prescription written. If she feels she needs more than prescribed for the month, please ask her to make an appointment with me to discuss.  Dani Gobble, MD [name], PGY-3

## 2017-10-31 ENCOUNTER — Encounter: Payer: Self-pay | Admitting: Internal Medicine

## 2017-10-31 NOTE — Progress Notes (Signed)
Patient brought norco prescriptions for May and June to clinic as requested after prescription quantity changed and new prescriptions distributed. These were personally seen and discarded by me.  Dani Gobble, MD [name], PGY-3

## 2017-11-05 ENCOUNTER — Ambulatory Visit
Admission: RE | Admit: 2017-11-05 | Discharge: 2017-11-05 | Disposition: A | Discharge status: Home / Self Care | Payer: 59 | Source: Ambulatory Visit | Attending: Family Medicine | Admitting: Family Medicine

## 2017-11-05 DIAGNOSIS — G8929 Other chronic pain: Secondary | ICD-10-CM

## 2017-11-05 DIAGNOSIS — M25551 Pain in right hip: Principal | ICD-10-CM

## 2017-11-20 ENCOUNTER — Other Ambulatory Visit: Payer: Self-pay

## 2017-11-20 DIAGNOSIS — G8929 Other chronic pain: Secondary | ICD-10-CM

## 2017-11-20 DIAGNOSIS — M25551 Pain in right hip: Principal | ICD-10-CM

## 2017-11-20 MED ORDER — HYDROCODONE-ACETAMINOPHEN 10-325 MG PO TABS: 1.0000 | ORAL_TABLET | Freq: Four times a day (QID) | ORAL | 0 refills | Status: DC | PRN

## 2017-11-20 NOTE — Telephone Encounter (Signed)
Pt requesting refill of hydrocodone for June. PT call back [phone] Shawna Orleans, RN

## 2017-11-21 NOTE — Telephone Encounter (Signed)
Pt called nurse line again regarding refill. [facility] informed [facility], and that she will need an appt for further refills. Pt due to come in for depo mid June, will schedule appt for when she needs her depo. Shawna Orleans, RN

## 2017-12-09 ENCOUNTER — Ambulatory Visit (INDEPENDENT_AMBULATORY_CARE_PROVIDER_SITE_OTHER): Payer: 59

## 2017-12-09 ENCOUNTER — Ambulatory Visit: Payer: 59

## 2017-12-09 DIAGNOSIS — Z3042 Encounter for surveillance of injectable contraceptive: Secondary | ICD-10-CM

## 2017-12-09 MED ORDER — MEDROXYPROGESTERONE ACETATE 150 MG/ML IM SUSY
150.0000 mg | PREFILLED_SYRINGE | Freq: Once | INTRAMUSCULAR | Status: AC
  Administered 2017-12-09: 150 mg via INTRAMUSCULAR

## 2017-12-09 NOTE — Progress Notes (Signed)
   Patient in to nurse clinic within dates for Depo-Provera. Injection given in LUOQ. Next injection due 02/24/18 - 03/10/18. Reminder card given.  Ples SpecterAlisa Lenell Mcconnell, RN [facility] RN)

## 2017-12-16 ENCOUNTER — Encounter: Payer: Self-pay | Admitting: Internal Medicine

## 2017-12-16 ENCOUNTER — Ambulatory Visit (INDEPENDENT_AMBULATORY_CARE_PROVIDER_SITE_OTHER): Payer: 59 | Admitting: Internal Medicine

## 2017-12-16 VITALS — BP 130/70 | HR 73 | Temp 98.3°F | Wt 170.8 lb

## 2017-12-16 DIAGNOSIS — G8929 Other chronic pain: Secondary | ICD-10-CM

## 2017-12-16 DIAGNOSIS — IMO0002 Reserved for concepts with insufficient information to code with codable children: Secondary | ICD-10-CM

## 2017-12-16 DIAGNOSIS — L720 Epidermal cyst: Secondary | ICD-10-CM | POA: Diagnosis not present

## 2017-12-16 DIAGNOSIS — M25551 Pain in right hip: Secondary | ICD-10-CM | POA: Diagnosis not present

## 2017-12-16 DIAGNOSIS — Z862 Personal history of diseases of the blood and blood-forming organs and certain disorders involving the immune mechanism: Secondary | ICD-10-CM | POA: Diagnosis not present

## 2017-12-16 DIAGNOSIS — M329 Systemic lupus erythematosus, unspecified: Secondary | ICD-10-CM

## 2017-12-16 DIAGNOSIS — G894 Chronic pain syndrome: Secondary | ICD-10-CM | POA: Diagnosis not present

## 2017-12-16 DIAGNOSIS — L659 Nonscarring hair loss, unspecified: Secondary | ICD-10-CM

## 2017-12-16 MED ORDER — HYDROCODONE-ACETAMINOPHEN 10-325 MG PO TABS: 1.0000 | ORAL_TABLET | Freq: Four times a day (QID) | ORAL | 0 refills | Status: DC | PRN

## 2017-12-16 NOTE — Patient Instructions (Signed)
Angela Walls,  Please ask your orthopedic surgeon about your hip.  Take your pain medication as needed and try to limit doses based on pain.  Please return for pap smear at your earliest convenience. You are also due for a TDAP shot.  I will call with lab results.  Best, Dr. Sampson GoonFitzgerald

## 2017-12-17 ENCOUNTER — Encounter: Payer: Self-pay | Admitting: Internal Medicine

## 2017-12-17 LAB — COMPREHENSIVE METABOLIC PANEL
ALT: 7 IU/L (ref 0–32)
AST: 15 IU/L (ref 0–40)
Albumin/Globulin Ratio: 1.8 (ref 1.2–2.2)
Albumin: 4.1 g/dL (ref 3.5–5.5)
Alkaline Phosphatase: 56 IU/L (ref 39–117)
BUN/Creatinine Ratio: 11 (ref 9–23)
BUN: 9 mg/dL (ref 6–20)
Bilirubin Total: 0.3 mg/dL (ref 0.0–1.2)
CO2: 21 mmol/L (ref 20–29)
Calcium: 9.1 mg/dL (ref 8.7–10.2)
Chloride: 107 mmol/L — ABNORMAL HIGH (ref 96–106)
Creatinine, Ser: 0.84 mg/dL (ref 0.57–1.00)
GFR calc Af Amer: 104 mL/min/{1.73_m2} (ref >59)
GFR calc non Af Amer: 90 mL/min/{1.73_m2} (ref >59)
Globulin, Total: 2.3 g/dL (ref 1.5–4.5)
Glucose: 62 mg/dL — ABNORMAL LOW (ref 65–99)
Potassium: 3.8 mmol/L (ref 3.5–5.2)
Sodium: 141 mmol/L (ref 134–144)
Total Protein: 6.4 g/dL (ref 6.0–8.5)

## 2017-12-17 LAB — CBC
Hematocrit: 33.8 % — ABNORMAL LOW (ref 34.0–46.6)
Hemoglobin: 11.3 g/dL (ref 11.1–15.9)
MCH: 29.6 pg (ref 26.6–33.0)
MCHC: 33.4 g/dL (ref 31.5–35.7)
MCV: 89 fL (ref 79–97)
Platelets: 368 10*3/uL (ref 150–450)
RBC: 3.82 x10E6/uL (ref 3.77–5.28)
RDW: 14.6 % (ref 12.3–15.4)
WBC: 5 10*3/uL (ref 3.4–10.8)

## 2017-12-17 LAB — FERRITIN: Ferritin: 84 ng/mL (ref 15–150)

## 2017-12-17 LAB — TSH: TSH: 0.971 u[IU]/mL (ref 0.450–4.500)

## 2017-12-17 NOTE — Progress Notes (Signed)
[name] Progress Note  Subjective:  Angela Walls is a 37 y.o. 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 She also has continued concern about hair loss and asks about previously noted cyst over chest.   #Right hip pain: - Pain has been slowly worsening despite not having to work due to ongoing worker's comp case after shoulder injury.  - Feels hip "catching" a few times a week now - Starting to consider idea of hip surgery; had been very hesitant previously - hip xray in May 2019 showed stable avascular necrosis with subchondral collapse but with worsened osteoarthritis of the right hip - Taking hydrocodone-acetaminophen 10-325 mg four times daily; tried to taper recently but was running out ROS: No recent falls, no numbness or tingling down leg  #Hair loss: - Has seen patchy loss over anterior scalp; tried ketoconazole previously without improvement - Took picture but does not have access to it today; now has false hair in place  #Lupus: - No recent labwork  #Cyst over chest: - Reports it was drained previously but seemed to fill back up; not painful currently or growing  HM: Due for TDAP and pap smear.   Allergies  Allergen Reactions  . Cinnamon Swelling  . Penicillins Hives and Swelling    Has patient had a PCN reaction causing immediate rash, facial/tongue/throat swelling, SOB or lightheadedness with hypotension: Yes Has patient had a PCN reaction causing severe rash involving mucus membranes or skin necrosis: No Has patient had a PCN reaction that required hospitalization: No Has patient had a PCN reaction occurring within the last 10 years: No If all of the above answers are "NO", then may proceed with Cephalosporin use.     Social History   Tobacco Use  . Smoking status: Never Smoker  . Smokeless tobacco: Never Used  Substance Use  Topics  . Alcohol use: No    Objective: Blood pressure 130/70, pulse 73, temperature 98.3 F (36.8 C), temperature source Oral, weight 170 lb 12.8 oz (77.5 kg), SpO2 100 %. Body mass index is 28.42 kg/m. Constitutional: Pleasant, well-appearing female in NAD Musculoskeletal: Pain with internal and external rotation at right hip; none with left hip; no TTP over greater trochanter. No midline spinal TTP. Neurological: AOx3, no focal deficits. Negative straight leg raise.  Skin: Skin is warm and dry. Firm, scarred, flesh-colored ~1 cm area over central chest, no TTP.  Psychiatric: Normal mood and affect.  Vitals reviewed  Assessment/Plan: Chronic right hip pain - Chronic, worsening. Norco allows patient to remain active. She plans to ask orthopedic doctor who she is seeing for her left shoulder about her hip and possibility of surgery.  - Will halt opioid taper with increased pain.  - UDS  Indication for chronic opioid:R hip pain 2/2 AVN Medication and dose:norco 10-325 mg # pills per month:120 with refill x 2 Last UDS date:02/2017; negative for opioids (had run out); will order today Pain contract signed (Y/N):Y Date narcotic database last reviewed (include red flags):12/16/17;appropriate  Hair loss - Unable to assess with fake hair in place. Will check TSH. Recommended trying natural style in case of traction damage.   SLE - BP normal. Receives depo shots for birth control. Will check basic bloodwork with CBC and CMP. - Encourage patient to re-establish with rheumatology.  - Would check urine for hgb, leukocytes, protein, casts to look for silent renal disease  at follow-up.   Epidermoid cyst of skin of chest - Stable. Not interested in removal at this time.  Follow-up in 3 months for chronic pain.  [name], MD [facility], PGY-3

## 2017-12-17 NOTE — Assessment & Plan Note (Addendum)
-   Chronic, worsening. Norco allows patient to remain active. She plans to ask orthopedic doctor who she is seeing for her left shoulder about her hip and possibility of surgery.  - Will halt opioid taper with increased pain.  - UDS  Indication for chronic opioid:R hip pain 2/2 AVN Medication and dose:norco 10-325 mg # pills per month:120 with refill x 2 Last UDS date:02/2017; negative for opioids (had run out); will order today Pain contract signed (Y/N):Y Date narcotic database last reviewed (include red flags):12/16/17;appropriate

## 2017-12-17 NOTE — Assessment & Plan Note (Signed)
-   BP normal. Receives depo shots for birth control. Will check basic bloodwork with CBC and CMP. - Encourage patient to re-establish with rheumatology.  - Would check urine for hgb, leukocytes, protein, casts to look for silent renal disease at follow-up.

## 2017-12-17 NOTE — Assessment & Plan Note (Signed)
-   Unable to assess with fake hair in place. Will check TSH. Recommended trying natural style in case of traction damage.

## 2017-12-17 NOTE — Assessment & Plan Note (Signed)
-   Stable. Not interested in removal at this time.

## 2017-12-20 LAB — TOXASSURE SELECT 13 (MW), URINE

## 2017-12-26 NOTE — Progress Notes (Deleted)
  Subjective:   Patient ID: Angela Walls    DOB: 02/18/1981, 37 y.o. female   MRN: [id number]  Angela Modeimisha D Wainwright is a 37 y.o. female with a history of HTN, avascular necrosis of R femoral head, SLE here for well woman/preventative visit and annual GYN examination  *** Acute Concerns: *** - SLE - to reestablish with rheumatology. Labs recently checked 6/25 with no evidence of renal dysfunction. Hb slightly low 11.3. Not currently on iron. - Chronic hip pain - h/o avascular necrosis with subchondral collapse with stableimaging 11/05/17 however did show worsened OA. On hydrocodone-APAP 10-325mg  QID, UDS 6/25 appropriate. Has pain contract. Considering hip surgery.  Diet: ***  Exercise: ***  Sexual/Birth History: [id number]: G3P0212, *** currently sexually active with *** partner  Birth Control: depo  Immunization:  Tdap/TD: due  Influenza: N/A  Cancer Screening:  Pap Smear: due  Review of Systems:  Per HPI.  PMFSH, medications and smoking status reviewed.  Objective:   There were no vitals taken for this visit. Vitals and nursing note reviewed.  General: well nourished, well developed, in no acute distress with non-toxic appearance HEENT: normocephalic, atraumatic, moist mucous membranes Neck: supple, non-tender without lymphadenopathy CV: regular rate and rhythm without murmurs, rubs, or gallops, no lower extremity edema Lungs: clear to auscultation bilaterally with normal work of breathing Abdomen: soft, non-tender, non-distended, no masses or organomegaly palpable, normoactive bowel sounds Skin: warm, dry, no rashes or lesions Extremities: warm and well perfused, normal tone MSK: ROM grossly intact, strength intact, gait normal Neuro: Alert and oriented, speech normal  Assessment & Plan:   No problem-specific Assessment & Plan notes found for this encounter.  No orders of the defined types were placed in this encounter.  No orders of the defined types were placed in this  encounter.   Ellwood DenseAlison Rumball, DO PGY-2, [facility] 12/26/2017 12:06 AM

## 2017-12-30 ENCOUNTER — Ambulatory Visit: Payer: 59 | Admitting: Family Medicine

## 2018-01-12 NOTE — Progress Notes (Deleted)
  Subjective:   Patient ID: Angela Walls    DOB: 01/24/1981, 37 y.o. female   MRN: [id number]  Angela Modeimisha D Hardenbrook is a 37 y.o. female with a history of HTN, SLE, DUB here for well woman/preventative visit and annual GYN examination  Acute Concerns: ***   Diet: ***  Exercise: ***  Sexual/Birth History: [id number]: G3P0212, *** currently sexually active with *** partner  Birth Control: Depo  POA/Living Will: ***  Review of Systems: Per HPI.    PMH, PSH, Medications, Allergies, and FmHx reviewed and updated in EMR.  Social History: *** smoker  Immunization:  Tdap/TD: due   Cancer Screening:  Pap Smear: due   ***SLE - Would check urine for hgb, leukocytes, protein, casts to look for silent renal disease at follow-up. Establish with rheum.  Review of Systems:  Per HPI.  PMFSH, medications and smoking status reviewed.  Objective:   There were no vitals taken for this visit. Vitals and nursing note reviewed.  General: well nourished, well developed, in no acute distress with non-toxic appearance HEENT: normocephalic, atraumatic, moist mucous membranes Neck: supple, non-tender without lymphadenopathy CV: regular rate and rhythm without murmurs, rubs, or gallops, no lower extremity edema Lungs: clear to auscultation bilaterally with normal work of breathing Abdomen: soft, non-tender, non-distended, no masses or organomegaly palpable, normoactive bowel sounds Skin: warm, dry, no rashes or lesions Extremities: warm and well perfused, normal tone MSK: ROM grossly intact, strength intact, gait normal Neuro: Alert and oriented, speech normal  Assessment & Plan:   No problem-specific Assessment & Plan notes found for this encounter.  No orders of the defined types were placed in this encounter.  No orders of the defined types were placed in this encounter.   Ellwood DenseAlison Rumball, DO PGY-2, [facility] 01/12/2018 6:06 PM

## 2018-01-14 ENCOUNTER — Ambulatory Visit: Payer: 59 | Admitting: Family Medicine

## 2018-03-09 ENCOUNTER — Telehealth: Payer: Self-pay | Admitting: Family Medicine

## 2018-03-09 NOTE — Telephone Encounter (Signed)
Pt was told she needs an appt with her new PCP. Made the first available appt with her PCP and pt said it was too far out because she has to get her vicodin before then since she needs it at the end of this month. Pt needs her vicodin refilled before her appt.

## 2018-03-10 ENCOUNTER — Other Ambulatory Visit: Payer: Self-pay | Admitting: Family Medicine

## 2018-03-10 DIAGNOSIS — G8929 Other chronic pain: Secondary | ICD-10-CM

## 2018-03-10 DIAGNOSIS — M25551 Pain in right hip: Principal | ICD-10-CM

## 2018-03-10 MED ORDER — HYDROCODONE-ACETAMINOPHEN 10-325 MG PO TABS: 1.0000 | ORAL_TABLET | Freq: Four times a day (QID) | ORAL | 0 refills | Status: DC | PRN

## 2018-03-10 NOTE — Telephone Encounter (Signed)
Refill provided, instructed pharmacy not to fill until 9/28 when she is due to run out. Patient has had 2 prior no shows with current PCP. Will need to be seen prior to any more refills.

## 2018-03-27 ENCOUNTER — Other Ambulatory Visit: Payer: Self-pay

## 2018-03-27 DIAGNOSIS — G8929 Other chronic pain: Secondary | ICD-10-CM

## 2018-03-27 DIAGNOSIS — M25551 Pain in right hip: Principal | ICD-10-CM

## 2018-03-27 MED ORDER — HYDROCODONE-ACETAMINOPHEN 10-325 MG PO TABS: 1.0000 | ORAL_TABLET | Freq: Four times a day (QID) | ORAL | 0 refills | Status: DC | PRN

## 2018-03-27 NOTE — Telephone Encounter (Signed)
Please re-send patient's Norco electronically. She has been told for over a week it was sent it but it was set to print.   [facility]  Ples Specter, RN [facility] RN)

## 2018-04-14 NOTE — Progress Notes (Signed)
Subjective:   Patient ID: Angela Walls    DOB: May 03, 1981, 37 y.o. female   MRN: [id number]  Angela Walls is a 37 y.o. female with a history of HTN, avascular necrosis of R femoral head with chronic pain, DUB, SLE here for   Establish Care - avascular necrosis of R hip - hip xray 10/2017 with stables avascular necrosis with subchondral collapse and worsened OA. Taking hydrocodone-APAP 10-325mg , previously worked on tapering with prior PCP. Previously considered hip surgery. - HTN - noted at previous visit, no meds - SLE - no chronic meds (previously on methotrexate, prednisone). Previously saw rheumatology Dr. Azzie Roup in [place]. No concerns. - shoulder pain due to injury at work. Currently not working, pain isn't as bad - previously on depo, not currently sexually active. Previously tried OCPS, does not want to try again.  Hair loss - has been seeing for some time - states her stylist noticed initial hair loss. Initially thought it was due to flat ironing but has progressed and itching. Denies pain. Previously tried Ketoconazole shampoo but is not helping. Oil helps some with itching. - noticing lots of dandruff with washing.  Cyst in middle of chest - started out as small bump then progressively got bigger. Was seen at [facility] last year with I&D done and purulent material expressed. Improved since then but still has small bump wear it was excised. Denies erythema or drainage or pain. Did not receive antibiotics for this.  Vaginal itching Has been using black soap to help relieve. Not currently sexually active, no concern for STD. No dysuria, no abnormal discharge. +itching.   Review of Systems:  Per HPI.  PMFSH, medications and smoking status reviewed.  Objective:   BP 140/80   Pulse 79   Temp 98.5 F (36.9 C)   Wt 176 lb 12.8 oz (80.2 kg)   SpO2 99%   BMI 29.42 kg/m  Vitals and nursing note reviewed.  General: well nourished, well developed, in no acute distress with  non-toxic appearance CV: regular rate and rhythm without murmurs, rubs, or gallops Lungs: clear to auscultation bilaterally with normal work of breathing Skin: warm, dry, no rashes. Small scar tissue present at medial edge of L breast without erythema or fluctuance. Some alopecia noted to midline without scarring, redness, or scaly plaques. Extremities: warm and well perfused, normal tone MSK: ROM grossly intact, strength intact, gait normal Neuro: Alert and oriented, speech normal  Assessment & Plan:   Vaginal itching Self swab done. Will call with wet prep results and treat accordingly.  Cyst of left breast Likely sebaceous cyst. Scar tissue appreciated on exam today from prior I&D without fluctuance or erythema. Advised to call if enlarged or inflamed for subsequent I&D.  Hair loss Refractory to ketoconazole shampoo. No scaling appreciated on today's exam to suggest fungal infection or dandruff. Could be traction alopecia however would not expect to be pruritic. 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 F/u if no better.  SLE Not currently on meds. Labs at last visit wnl. Advised to reestablish with Rheumatology.  Orders Placed This Encounter  Procedures  . POCT Wet Prep [facility])   Meds ordered this encounter  Medications  . Clobetasol Propionate 0.025 % CREA    Sig: Apply 1 application topically 2 (two) times daily. For 3 weeks, then once daily for 3 weeks, then every other day for 6 weeks.    Dispense:  100 g    Refill:  2  .  fluconazole (DIFLUCAN) 150 MG tablet    Sig: Take 1 tablet (150 mg total) by mouth once for 1 dose.    Dispense:  1 tablet    Refill:  0   Ellwood Dense, DO PGY-2, [facility] 04/16/2018 8:39 AM

## 2018-04-15 ENCOUNTER — Ambulatory Visit (INDEPENDENT_AMBULATORY_CARE_PROVIDER_SITE_OTHER): Payer: Medicaid Other | Admitting: Family Medicine

## 2018-04-15 VITALS — BP 140/80 | HR 79 | Temp 98.5°F | Wt 176.8 lb

## 2018-04-15 DIAGNOSIS — N6002 Solitary cyst of left breast: Secondary | ICD-10-CM | POA: Diagnosis not present

## 2018-04-15 DIAGNOSIS — N898 Other specified noninflammatory disorders of vagina: Secondary | ICD-10-CM

## 2018-04-15 DIAGNOSIS — M329 Systemic lupus erythematosus, unspecified: Secondary | ICD-10-CM | POA: Diagnosis not present

## 2018-04-15 DIAGNOSIS — L659 Nonscarring hair loss, unspecified: Secondary | ICD-10-CM

## 2018-04-15 LAB — POCT WET PREP (WET MOUNT)
Clue Cells Wet Prep Whiff POC: NEGATIVE
Trichomonas Wet Prep HPF POC: ABSENT

## 2018-04-15 MED ORDER — FLUCONAZOLE 150 MG PO TABS: 150.0000 mg | ORAL_TABLET | Freq: Once | ORAL | 0 refills | Status: AC

## 2018-04-15 MED ORDER — CLOBETASOL PROPIONATE 0.025 % EX CREA: 1.0000 "application " | TOPICAL_CREAM | Freq: Two times a day (BID) | CUTANEOUS | 2 refills | Status: DC

## 2018-04-15 NOTE — Assessment & Plan Note (Signed)
Refractory to ketoconazole shampoo. No scaling appreciated on today's exam to suggest fungal infection or dandruff. Could be traction alopecia however would not expect to be pruritic. 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 F/u if no better.

## 2018-04-15 NOTE — Assessment & Plan Note (Addendum)
Not currently on meds. Labs at last visit wnl. Advised to reestablish with Rheumatology.

## 2018-04-15 NOTE — Assessment & Plan Note (Signed)
Likely sebaceous cyst. Scar tissue appreciated on exam today from prior I&D without fluctuance or erythema. Advised to call if enlarged or inflamed for subsequent I&D.

## 2018-04-15 NOTE — Patient Instructions (Signed)
It was great to see you!  Our plans for today:  - I sent in a cream for your hair loss. - Let us know if your cyst worsens. - I will call you with the results of your wet prep. - I recommend reestablishing with Rheumatology. - make an appointment for your pap smear and high blood pressure  Take care and seek immediate care sooner if you develop any concerns.   Dr. [facility]

## 2018-04-15 NOTE — Assessment & Plan Note (Signed)
Self swab done. Will call with wet prep results and treat accordingly.

## 2018-04-16 ENCOUNTER — Other Ambulatory Visit: Payer: Self-pay | Admitting: Family Medicine

## 2018-04-16 ENCOUNTER — Telehealth: Payer: Self-pay | Admitting: *Deleted

## 2018-04-16 ENCOUNTER — Encounter: Payer: Self-pay | Admitting: Family Medicine

## 2018-04-16 MED ORDER — CLOBETASOL PROPIONATE 0.05 % EX CREA: 1.0000 "application " | TOPICAL_CREAM | Freq: Two times a day (BID) | CUTANEOUS | 0 refills | Status: DC

## 2018-04-16 NOTE — Telephone Encounter (Signed)
Attempted to call pt.  No answer and no machine.   Brenon Antosh Dawn, CMA  

## 2018-04-16 NOTE — Telephone Encounter (Signed)
Pt called to let us know that the clobetasol cream is going to cost $1000.  She cant afford that.  I contacted pharmacy, that dosage only comes as name brand which is why it was so expensive.    The standard dose of 0.05% for a 300g tube would cost $60.    Contacted pt back, this price is acceptable to her.   Will forward to MD to advise. [place], Maryjo Rochester, CMA

## 2018-04-16 NOTE — Telephone Encounter (Signed)
Order changed and sent to pharmacy.  

## 2018-04-21 ENCOUNTER — Other Ambulatory Visit: Payer: Self-pay | Admitting: *Deleted

## 2018-04-21 DIAGNOSIS — M25551 Pain in right hip: Principal | ICD-10-CM

## 2018-04-21 DIAGNOSIS — G8929 Other chronic pain: Secondary | ICD-10-CM

## 2018-04-21 MED ORDER — HYDROCODONE-ACETAMINOPHEN 10-325 MG PO TABS: 1.0000 | ORAL_TABLET | Freq: Four times a day (QID) | ORAL | 0 refills | Status: DC | PRN

## 2018-04-21 NOTE — Telephone Encounter (Signed)
Pharm[facility] [address] is telling her they never received the cream.  Called and was placed on hole 12+ minutes one time and 10+ minutes the 2nd time.    LMOVM of pharmacy explaining what was communicated from pharmacy yesterday and called medication in verbally since they told pt they have not received yet.   Pt informed and agreeable.  [facility] [address] because [facility].  Have changed in chart. Fleeger, Maryjo Rochester, CMA

## 2018-04-23 ENCOUNTER — Other Ambulatory Visit: Payer: Self-pay | Admitting: *Deleted

## 2018-04-23 DIAGNOSIS — M25551 Pain in right hip: Principal | ICD-10-CM

## 2018-04-23 DIAGNOSIS — G8929 Other chronic pain: Secondary | ICD-10-CM

## 2018-04-23 MED ORDER — HYDROCODONE-ACETAMINOPHEN 10-325 MG PO TABS: 1.0000 | ORAL_TABLET | Freq: Four times a day (QID) | ORAL | 0 refills | Status: DC | PRN

## 2018-04-23 MED ORDER — CLOBETASOL PROPIONATE 0.05 % EX CREA: 1.0000 "application " | TOPICAL_CREAM | Freq: Two times a day (BID) | CUTANEOUS | 0 refills | Status: DC

## 2018-04-23 NOTE — Telephone Encounter (Signed)
[facility] [address]  She has decided not to use them as they "give her the run around all the time and never have her scripts ready".  They did not have the pain meds in stock and will have to order and this is the 2nd time.  Pt is requesting the new script [facility] [address]. Tyleah Loh, Maryjo Rochester, CMA

## 2018-04-23 NOTE — Telephone Encounter (Signed)
Pt calls back. [facility] [address] [facility] causing issues. They are quoting her $900.  She does not want to deal with them anymore.   [facility] [address].  The issue is that the $60 is for the 30gram tube not a 300 gram tube I was told about.   [facility] [address] at the request of the patient. Fleeger, Maryjo Rochester, CMA

## 2018-04-23 NOTE — Addendum Note (Signed)
Addended by: [name] D on: 04/23/2018 09:00 AM   Modules accepted: Orders

## 2018-05-01 ENCOUNTER — Telehealth: Payer: Self-pay

## 2018-05-01 NOTE — Telephone Encounter (Signed)
Patient called to say that [employer] needs PA, patient on Medicaid now.  Clinical info submitted via [employer]. Status suspended. Will recheck status next business day.  Ples Specter, RN [facility] RN)

## 2018-05-04 ENCOUNTER — Telehealth: Payer: Self-pay | Admitting: *Deleted

## 2018-05-04 NOTE — Telephone Encounter (Signed)
Prior approval for Norco completed via [place] [employer].  Med approved for 05/01/18 - 10/28/18.  Prior approval  # [id number].  [facility]  Nigel Mormon, RN

## 2018-05-04 NOTE — Telephone Encounter (Signed)
Pt calls and states that she was told that we would need to do something about her getting reimbursed for the hydrocodone.   Evidentially, pt has had medicaid since August and did not know it. Advised I would call pharmacy to get an idea on how to go about that.  Spoke with [place] [facility], even if we were able to get a backdated PA, [facility] would not be able to [facility] it.  Gunnar Fusi states that pt would need to call caseworker to get reimbursed.   LMOVM informing pt. Cerita Rabelo, Maryjo Rochester, CMA

## 2018-05-05 DIAGNOSIS — R079 Chest pain, unspecified: Secondary | ICD-10-CM | POA: Diagnosis not present

## 2018-05-05 NOTE — Telephone Encounter (Signed)
Spoke with patient. Gave Jessica's message. Patient stated she did not understand and that this is not what [facility] told [facility]. Patient states she is aware that she will have to contact caseworker about some of this but that when she called on 11/8 she was at the pharmacy and she told (me) that a PA needed to be done but dated on the 11/3 which is when she picked up the medication.  Explained that when she spoke with me on 11/8 she only said that a PA needed to be done for her Hydrocodone and not that she had already picked up the Rx on 11/3. PA done on 11/8 and approved through 10/28/18. There is no way for me to change that to get that Rx she had already picked up paid for but that is was approved going forward.  Patient upset by this, I offered to call [employer] to see about a solution since both Shanda Bumps and myself "misunderstood everything she said". Patient stated she will call them herself because there are other meds involved also and she hung up.  Ples Specter, RN [facility] RN)

## 2018-05-06 ENCOUNTER — Telehealth: Payer: Self-pay | Admitting: Family Medicine

## 2018-05-06 ENCOUNTER — Ambulatory Visit: Payer: Medicaid Other

## 2018-05-06 NOTE — Telephone Encounter (Signed)
Thanks. Please remember to send her a certified letter as well stating your discussion regarding dismissal.

## 2018-05-06 NOTE — Telephone Encounter (Signed)
Spoke to patient regarding recent no-show and cancellation history. Patient has had 3 no-shows in the last 6 months with most recent being today. She states she cancelled this appointment for today as she was seen by Urgent Care yesterday. Explained to patient if she no-shows or cancels upcoming appointment on 11/20, she would likely be dismissed from our practice given our current policy. Patient verbalized understanding and ensured she would make it to her upcoming appointment.  Ellwood DenseAlison Anaalicia Reimann, DO PGY-2, [facility] 05/06/2018 5:20 PM

## 2018-05-07 ENCOUNTER — Encounter: Payer: Self-pay | Admitting: Family Medicine

## 2018-05-07 ENCOUNTER — Telehealth: Payer: Self-pay

## 2018-05-07 NOTE — Telephone Encounter (Signed)
Letter completed and [facility] to [facility] for mailing.

## 2018-05-07 NOTE — Telephone Encounter (Signed)
Patient called on 05/06/18 to again discuss getting her medications she had paid for made retroactive for Medicaid so she can get reimbursed. Stated that she had spoken with Medicaid and was told "the doctor has to do it".  Told her I would call and try to get her PA for Hydrocodone made effective for 01/22/18. Patient angrily stated it is for all of her medications she has paid for and not just Hydrocodone. Advised her that I would [facility].  Called [employer] this morning,  Interaction ID #[id number]. Was able to get PA for Hydrocodone back-dated to 01/22/18. Was told it is now the patient's responsibility to go to or call the pharmacy and ask them to re-run her medications under Medicaid and ask them to reimburse her.  With regard to her other medications that did not need prior approval, the patient must go to the pharmacy where she got them filled and ask them to re-run them under Medicaid and reimburse her money. [employer] also said the pharmacy may not have the capability to go back and do this more than a couple of weeks in which case the patient may have to communicate with the corporate office of the pharmacy but it is the responsibility of the patient to do all of this, not the doctor's office.

## 2018-05-11 ENCOUNTER — Ambulatory Visit: Payer: Medicaid Other | Admitting: Podiatry

## 2018-05-11 NOTE — Telephone Encounter (Signed)
Left voicemail to return call.  Ples SpecterAlisa Brake, RN [facility] RN)

## 2018-05-12 ENCOUNTER — Other Ambulatory Visit: Payer: Self-pay | Admitting: Internal Medicine

## 2018-05-12 ENCOUNTER — Ambulatory Visit
Admission: RE | Admit: 2018-05-12 | Discharge: 2018-05-12 | Disposition: A | Discharge status: Home / Self Care | Payer: Medicaid Other | Source: Ambulatory Visit | Attending: Family Medicine | Admitting: Family Medicine

## 2018-05-12 DIAGNOSIS — R922 Inconclusive mammogram: Secondary | ICD-10-CM | POA: Diagnosis not present

## 2018-05-12 DIAGNOSIS — N6002 Solitary cyst of left breast: Secondary | ICD-10-CM

## 2018-05-12 DIAGNOSIS — N6312 Unspecified lump in the right breast, upper inner quadrant: Secondary | ICD-10-CM | POA: Diagnosis not present

## 2018-05-12 DIAGNOSIS — N6320 Unspecified lump in the left breast, unspecified quadrant: Secondary | ICD-10-CM

## 2018-05-12 DIAGNOSIS — N6321 Unspecified lump in the left breast, upper outer quadrant: Secondary | ICD-10-CM | POA: Diagnosis not present

## 2018-05-12 DIAGNOSIS — N6311 Unspecified lump in the right breast, upper outer quadrant: Secondary | ICD-10-CM | POA: Diagnosis not present

## 2018-05-12 DIAGNOSIS — N6001 Solitary cyst of right breast: Secondary | ICD-10-CM

## 2018-05-12 DIAGNOSIS — R2232 Localized swelling, mass and lump, left upper limb: Secondary | ICD-10-CM

## 2018-05-13 ENCOUNTER — Ambulatory Visit: Payer: Self-pay | Admitting: Family Medicine

## 2018-05-13 ENCOUNTER — Ambulatory Visit (INDEPENDENT_AMBULATORY_CARE_PROVIDER_SITE_OTHER): Payer: Medicaid Other

## 2018-05-13 ENCOUNTER — Ambulatory Visit: Payer: Medicaid Other | Admitting: Podiatry

## 2018-05-13 ENCOUNTER — Encounter: Payer: Self-pay | Admitting: Podiatry

## 2018-05-13 DIAGNOSIS — M2012 Hallux valgus (acquired), left foot: Secondary | ICD-10-CM

## 2018-05-13 DIAGNOSIS — M2011 Hallux valgus (acquired), right foot: Secondary | ICD-10-CM

## 2018-05-13 NOTE — Patient Instructions (Addendum)
Bunion A bunion is a bump on the base of the big toe that forms when the bones of the big toe joint move out of position. Bunions may be small at first, but they often get larger over time. The can make walking painful. What are the causes? A bunion may be caused by:  Wearing narrow or pointed shoes that force the big toe to press against the other toes.  Abnormal foot development that causes the foot to roll inward (pronate).  Changes in the foot that are caused by certain diseases, such as rheumatoid arthritis and polio.  A foot injury.  What increases the risk? The following factors may make you more likely to develop this condition:  Wearing shoes that squeeze the toes together.  Having certain diseases, such as: ? Rheumatoid arthritis. ? Polio. ? Cerebral palsy.  Having family members who have bunions.  Being born with a foot deformity, such as flat feet or low arches.  Doing activities that put a lot of pressure on the feet, such as ballet dancing.  What are the signs or symptoms? The main symptom of a bunion is a noticeable bump on the big toe. Other symptoms may include:  Pain.  Swelling around the big toe.  Redness and inflammation.  Thick or hardened skin on the big toe or between the toes.  Stiffness or loss of motion in the big toe.  Trouble with walking.  How is this diagnosed? A bunion may be diagnosed based on your symptoms, medical history, and activities. You may have tests, such as:  X-rays. These allow your health care provider to check the position of the bones in your foot and look for damage to your joint. They also help your health care provider to determine the severity of your bunion and the best way to treat it.  Joint aspiration. In this test, a sample of fluid is removed from the toe joint. This test, which may be done if you are in a lot of pain, helps to rule out diseases that cause painful swelling of the joints, such as  arthritis.  How is this treated? There is no cure for a bunion, but treatment can help to prevent a bunion from getting worse. Treatment depends on the severity of your symptoms. Your health care provider may recommend:  Wearing shoes that have a wide toe box.  Using bunion pads to cushion the affected area.  Taping your toes together to keep them in a normal position.  Placing a device inside your shoe (orthotics) to help reduce pressure on your toe joint.  Taking medicine to ease pain, inflammation, and swelling.  Applying heat or ice to the affected area.  Doing stretching exercises.  Surgery to remove scar tissue and move the toes back into their normal position. This treatment is rare.  Follow these instructions at home:  Support your toe joint with proper footwear, shoe padding, or taping as told by your health care provider.  Take over-the-counter and prescription medicines only as told by your health care provider.  If directed, apply ice to the injured area: ? Put ice in a plastic bag. ? Place a towel between your skin and the bag. ? Leave the ice on for 20 minutes, 2-3 times per day.  If directed, apply heat to the affected area before you exercise. Use the heat source that your health care provider recommends, such as a moist heat pack or a heating pad. ? Place a towel between your   skin and the heat source. ? Leave the heat on for 20-30 minutes. ? Remove the heat if your skin turns bright red. This is especially important if you are unable to feel pain, heat, or cold. You may have a greater risk of getting burned.  Do exercises as told by your health care provider.  Keep all follow-up visits as told by your health care provider. Contact a health care provider if:  Your symptoms get worse.  Your symptoms do not improve in 2 weeks. Get help right away if:  You have severe pain and trouble with walking. This information is not intended to replace advice given  to you by your health care provider. Make sure you discuss any questions you have with your health care provider. Document Released: 06/10/2005 Document Revised: 11/16/2015 Document Reviewed: 01/08/2015 Elsevier Interactive Patient Education  2018 Elsevier Inc.  Pre-Operative Instructions  Congratulations, you have decided to take an important step towards improving your quality of life.  You can be assured that the doctors and staff at [facility] will be with you every step of the way.  Here are some important things you should know:  1. Plan to be at the surgery center/hospital at least 1 (one) hour prior to your scheduled time, unless otherwise directed by the surgical center/hospital staff.  You must have a responsible adult accompany you, remain during the surgery and drive you home.  Make sure you have directions to the surgical center/hospital to ensure you arrive on time. 2. If you are having surgery at [facility] or [facility], you will need a copy of your medical history and physical form from your family physician within one month prior to the date of surgery. We will give you a form for your primary physician to complete.  3. We make every effort to accommodate the date you request for surgery.  However, there are times where surgery dates or times have to be moved.  We will contact you as soon as possible if a change in schedule is required.   4. No aspirin/ibuprofen for one week before surgery.  If you are on aspirin, any non-steroidal anti-inflammatory medications (Mobic, Aleve, Ibuprofen) should not be taken seven (7) days prior to your surgery.  You make take Tylenol for pain prior to surgery.  5. Medications - If you are taking daily heart and blood pressure medications, seizure, reflux, allergy, asthma, anxiety, pain or diabetes medications, make sure you notify the surgery center/hospital before the day of surgery so they can tell you which medications you should  take or avoid the day of surgery. 6. No food or drink after midnight the night before surgery unless directed otherwise by surgical center/hospital staff. 7. No alcoholic beverages 24-hours prior to surgery.  No smoking 24-hours prior or 24-hours after surgery. 8. Wear loose pants or shorts. They should be loose enough to fit over bandages, boots, and casts. 9. Don't wear slip-on shoes. Sneakers are preferred. 10. Bring your boot with you to the surgery center/hospital.  Also bring crutches or a walker if your physician has prescribed it for you.  If you do not have this equipment, it will be provided for you after surgery. 11. If you have not been contacted by the surgery center/hospital by the day before your surgery, call to confirm the date and time of your surgery. 12. Leave-time from work may vary depending on the type of surgery you have.  Appropriate arrangements should be made prior   to surgery with your employer. 13. Prescriptions will be provided immediately following surgery by your doctor.  Fill these as soon as possible after surgery and take the medication as directed. Pain medications will not be refilled on weekends and must be approved by the doctor. 14. Remove nail polish on the operative foot and avoid getting pedicures prior to surgery. 15. Wash the night before surgery.  The night before surgery wash the foot and leg well with water and the antibacterial soap provided. Be sure to pay special attention to beneath the toenails and in between the toes.  Wash for at least three (3) minutes. Rinse thoroughly with water and dry well with a towel.  Perform this wash unless told not to do so by your physician.  Enclosed: 1 Ice pack (please put in freezer the night before surgery)   1 Hibiclens skin cleaner   Pre-op instructions  If you have any questions regarding the instructions, please do not hesitate to call our office.  [place]: [address], [place], [place] [address] --  [phone]  [place]: [address] [place], [place] [address] -- [phone]  [place]: [address]  [place], [place] [address] -- [phone]  [place]: [address], [address], [place], [place] [address] -- [phone]  Website: [email or web address]  

## 2018-05-14 NOTE — Progress Notes (Signed)
Subjective:   Patient ID: Angela Walls, female   DOB: 37 y.o.   MRN: [id number]   HPI Patient has not been seen for a number years and states that she is wanting to get her bunion fixed and that it is becoming increasingly tender and she is tried wider shoes she is tried soaks and we have tried previous injection treatment which was not successful she is finally able to get the surgery done with her work schedule   Review of Systems  All other systems reviewed and are negative.       Objective:  Physical Exam  Constitutional: She appears well-developed and well-nourished.  Cardiovascular: Intact distal pulses.  Pulmonary/Chest: Effort normal.  Musculoskeletal: Normal range of motion.  Neurological: She is alert.  Skin: Skin is warm.  Nursing note and vitals reviewed.   Neurovascular status intact muscle strength is adequate range of motion within normal limits with patient found to have hyperostosis medial aspect first metatarsal head left over right with redness around the joint and pain with palpation.  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  At this point patient wants surgery and I explained surgery in the difficulty of fixing this and the fact there is no long-term guarantees associated with correction.  She is willing to accept risk and read consent form going over alternative treatments complications and after extensive review signed consent form and is scheduled for outpatient surgery.  She is encouraged to call us with any questions prior to procedure we will try to get it done as soon as possible but schedule is very busy and it may be a delay of 4 to 6 weeks.  Patient again is encouraged to call with any concerns questions and does understand total  recovery will take 6 months to 1 year  X-ray indicates elevation of the intermetatarsal angle left over right with prominence of the metatarsal head left over right

## 2018-05-15 ENCOUNTER — Other Ambulatory Visit: Payer: Medicaid Other

## 2018-05-19 ENCOUNTER — Ambulatory Visit
Admission: RE | Admit: 2018-05-19 | Discharge: 2018-05-19 | Disposition: A | Discharge status: Home / Self Care | Payer: Medicaid Other | Source: Ambulatory Visit | Attending: Family Medicine | Admitting: Family Medicine

## 2018-05-19 DIAGNOSIS — R590 Localized enlarged lymph nodes: Secondary | ICD-10-CM | POA: Diagnosis not present

## 2018-05-19 DIAGNOSIS — R2232 Localized swelling, mass and lump, left upper limb: Secondary | ICD-10-CM

## 2018-05-19 DIAGNOSIS — N6320 Unspecified lump in the left breast, unspecified quadrant: Secondary | ICD-10-CM

## 2018-05-19 DIAGNOSIS — D242 Benign neoplasm of left breast: Secondary | ICD-10-CM | POA: Diagnosis not present

## 2018-05-19 DIAGNOSIS — N6321 Unspecified lump in the left breast, upper outer quadrant: Secondary | ICD-10-CM | POA: Diagnosis not present

## 2018-05-26 ENCOUNTER — Telehealth: Payer: Self-pay

## 2018-05-26 DIAGNOSIS — G8929 Other chronic pain: Secondary | ICD-10-CM

## 2018-05-26 DIAGNOSIS — M25551 Pain in right hip: Principal | ICD-10-CM

## 2018-05-26 NOTE — Telephone Encounter (Signed)
Patient NEEDS to come in for a lab visit for UDS before refilling hydrocodone.

## 2018-05-26 NOTE — Telephone Encounter (Signed)
Will forward to MD. Jazmin Hartsell,CMA  

## 2018-05-26 NOTE — Telephone Encounter (Signed)
Pt called again concerning her medication. I informed her that she would need to set up a lab appointment before medication could be refilled. She refused appointment and would like for Dr. Linwood Dibblesumball to call her at [phone].

## 2018-05-27 ENCOUNTER — Telehealth: Payer: Self-pay | Admitting: Family Medicine

## 2018-05-27 ENCOUNTER — Other Ambulatory Visit: Payer: Self-pay | Admitting: Family Medicine

## 2018-05-27 DIAGNOSIS — M25551 Pain in right hip: Principal | ICD-10-CM

## 2018-05-27 DIAGNOSIS — G8929 Other chronic pain: Secondary | ICD-10-CM

## 2018-05-27 MED ORDER — HYDROCODONE-ACETAMINOPHEN 10-325 MG PO TABS: 1.0000 | ORAL_TABLET | Freq: Four times a day (QID) | ORAL | 0 refills | Status: DC | PRN

## 2018-05-27 NOTE — Telephone Encounter (Signed)
Pt is calling for a refill on her pain medication. She said that she has no problem doing a urine test but she has to know in advance to be able to get off. She is tired of every time she needs a refill she has to go around and around she has been a patient her since she was 21 years and feels that she should not be treated like over medication that she needs. jw

## 2018-05-27 NOTE — Telephone Encounter (Signed)
Pasted this note into the already existing note from yesterday.  Jazmin Hartsell,CMA

## 2018-05-27 NOTE — Telephone Encounter (Signed)
Pt called again, upset she has not heard from Dr. Linwood Dibblesumball. Pt refuses to take a urine test in order to get her refill of hydrocodone. Pt demands to change her PCP so she can get this med refilled. Pt then asked to speak to director so I transferred to BethanyJeannette.

## 2018-05-27 NOTE — Telephone Encounter (Signed)
Message left on Asst. Director voice mail - Patient states she has been out of her med since last Friday.  Has been unable to get med refilled due to we were closed for holiday on 11/28 & 11/29.  States she is aware of the protocol with this pain med and has never had an issue until being changed to new PCP.  States she has vascular necrosis and needs pain med to get out of bed and be able to work.  Requesting to change MD due to this issue and states she was marked as missing her last appt when she called to cancel it. Will route note to PCP and Dr. Lum BabeEniola (Medical Director) for advice.  [name], BSN, RN-BC

## 2018-05-27 NOTE — Telephone Encounter (Signed)
I discussed the patient's issue with Dr. Linwood Dibblesumball. There is no need at this point to have her come in for Utox before her next appointment on 06/01/18. If the patient refuses to give her urine at that appointment, then the practice can decide to stop refilling her pain medication. Dr. Linwood Dibblesumball will go ahead and refill her meds to last her till her next appointment. If she cancels or misses her appointment, Dr. Linwood Dibblesumball may decide to refer her to a pain management clinic.  I called and discussed the issue with the patient. She requested a PCP change. I explained our pain management and appointment cancelation policy to her. I advised her that she need not come in for UTOX for now till her next appointment and that Dr. Linwood Dibblesumball will go ahead and refill her medications. She will wait to see Dr. Linwood Dibblesumball on 12/9 to decide whether or not she wants to proceed with a PCP change.  She was appreciative of the call.

## 2018-05-27 NOTE — Telephone Encounter (Signed)
Dereck LigasWarner, Jacqueline L 25 minutes ago (2:21 PM)      Pt is calling for a refill on her pain medication. She said that she has no problem doing a urine test but she has to know in advance to be able to get off. She is tired of every time she needs a refill she has to go around and around she has been a patient her since she was 21 years and feels that she should not be treated like over medication that she needs. jw      Documentation     Nadara Modeate, Illeana D [phone]  Caren MacadamWarner, Jacqueline L

## 2018-05-28 ENCOUNTER — Telehealth: Payer: Self-pay | Admitting: Family Medicine

## 2018-05-28 ENCOUNTER — Other Ambulatory Visit: Payer: Self-pay | Admitting: Family Medicine

## 2018-05-28 DIAGNOSIS — G8929 Other chronic pain: Secondary | ICD-10-CM

## 2018-05-28 DIAGNOSIS — M25551 Pain in right hip: Principal | ICD-10-CM

## 2018-05-28 MED ORDER — HYDROCODONE-ACETAMINOPHEN 10-325 MG PO TABS: 1.0000 | ORAL_TABLET | Freq: Four times a day (QID) | ORAL | 0 refills | Status: DC | PRN

## 2018-05-28 NOTE — Telephone Encounter (Signed)
I called pharmacy to cancel previous prescription by Dr. Linwood Dibblesumball. I spoke with Dorene GrebeNatalie at [facility].

## 2018-05-28 NOTE — Telephone Encounter (Signed)
Pt is calling and would like to speak to Dr. Lum BabeEniola. She called the pharmacy but they didn't have the whole prescription and she is confused. Please call her at [phone]. jw

## 2018-05-28 NOTE — Telephone Encounter (Signed)
I spoke with the patient. She is upset about getting a few days supply of her medication. I explained to her that her meds was refilled to last her till her next appointment in few days. She said she will not want to pay for her medication twice in the same month. She refused to pick up the medication and stated that she was her full month prescription. I informed her that I will speak with Dr. Linwood Dibblesumball to check if it is ok to fill for a month but she will need to f/u for her appointment 06/01/18 otherwise, Dr. Linwood Dibblesumball will stop refilling her prescription.  She agreed and stated that she will attend her appointment on 06/01/18 and if she does not, Dr. Linwood Dibblesumball and the practice can go ahead and stop her prescription and refer her to pain clinic.  I called and discussed with Dr. Linwood Dibblesumball, she agreed with the plan.   Dr. Linwood Dibblesumball, will discussed our opioid prescription policy and recommendations for f/u and Utox screening during her next appointment.

## 2018-05-31 NOTE — Progress Notes (Signed)
Subjective:   Patient ID: Angela Walls    DOB: 09-Jul-1980, 37 y.o. female   MRN: [id number]  Angela Walls is a 37 y.o. female with a history of HTN, avascular necrosis with chronic R hip pain, SLE here for   Chronic pain Indication for chronic opioid: avascular necrosis of R hip Medication and dose: Norco 10-325 q6h PRN. Takes 3-4 times per day. # pills per month: 120 Last UDS date: 11/2017 Pain contract signed (Y/N): Y, 12/2012 Date narcotic database last reviewed (include red flags): 05/31/18, no red flags Currently doesn't have insurance, wants to try physical therapy before hip replacement surgery.  Pap Smear - Here for need for updated Pap smear, last in 2014, negative for abnormalities - Does endorse some vaginal irritation, thinks this may be because she has switched soaps.  No abnormal discharge or bleeding.  - came off depo b/c she thought it was causing hair loss. Hasn't restarted period yet.  - not sexually active - Is considering other forms of birth control - G2P2, does not want future kids  Review of Systems:  Per HPI.  PMFSH, medications and smoking status reviewed.  Objective:   BP (!) 142/72 (BP Location: Right Arm, Patient Position: Sitting, Cuff Size: Large)   Pulse 68   Temp 98.2 F (36.8 C) (Oral)   Ht 5\' 5"  (1.651 m)   Wt 181 lb 6.4 oz (82.3 kg)   SpO2 99%   BMI 30.19 kg/m  Vitals and nursing note reviewed.  General: well nourished, well developed, in no acute distress with non-toxic appearance CV: regular rate and rhythm without murmurs, rubs, or gallops Lungs: clear to auscultation bilaterally with normal work of breathing GYN:  External genitalia within normal limits.  Vaginal mucosa pink, moist, normal rugae.  Nonfriable cervix without lesions, no discharge or bleeding noted on speculum exam.  Bimanual exam revealed normal, nongravid uterus.  No cervical motion tenderness. No adnexal masses bilaterally.   Skin: warm, dry, no rashes or  lesions Extremities: warm and well perfused, normal tone MSK: ROM grossly intact, strength intact, gait normal Neuro: Alert and oriented, speech normal  Assessment & Plan:   Chronic right hip pain Due to avascular necrosis of right hip.  On Norco 10-325 mg q6h PRN, using consistently 4 times per day.  Discussed dangers of frequent use for long duration and need to taper medication.  Patient agrees with this plan however requests establishment with physical therapy prior to taper or surgical consultation.  Believe this is reasonable, will place order.  After establishment with PT, will again discuss taper.  No refills needed today.  PMP aware reviewed without red flags.  UDS obtained today.  Vaginal irritation Wet prep obtained today, consistent with BV, will prescribe Flagyl x7 days.  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  Patient requests surgical removal due to frequent irritation, order placed for referral for surgical consultation.  Contraception management Patient desires alternative form of birth control, options discussed today.  Handout given.  Patient will think about options and inform office of decision.  Orders Placed This Encounter  Procedures  . ToxASSURE Select 13 (MW), Urine  . Ambulatory referral to Physical Therapy    Referral Priority:   Routine    Referral Type:   Physical Medicine    Referral Reason:   Specialty Services Required    Requested Specialty:   Physical Therapy    Number of Visits Requested:   1  . Ambulatory referral to  General Surgery    Referral Priority:   Routine    Referral Type:   Surgical    Referral Reason:   Specialty Services Required    Requested Specialty:   General Surgery    Number of Visits Requested:   1  . POCT Wet Prep [facility] [place])   Meds ordered this encounter  Medications  . metroNIDAZOLE (FLAGYL) 500 MG tablet    Sig: Take 1 tablet (500 mg total) by mouth 2 (two) times daily.    Dispense:  14  tablet    Refill:  0    [place] DenseAlison Mykael Trott, DO PGY-2, [facility] 06/01/2018 3:38 PM

## 2018-06-01 ENCOUNTER — Ambulatory Visit (INDEPENDENT_AMBULATORY_CARE_PROVIDER_SITE_OTHER): Payer: Medicaid Other | Admitting: Family Medicine

## 2018-06-01 ENCOUNTER — Other Ambulatory Visit (HOSPITAL_COMMUNITY)
Admission: RE | Admit: 2018-06-01 | Discharge: 2018-06-01 | Disposition: A | Discharge status: Home / Self Care | Payer: Medicaid Other | Source: Ambulatory Visit | Attending: Family Medicine | Admitting: Family Medicine

## 2018-06-01 ENCOUNTER — Encounter: Payer: Self-pay | Admitting: Family Medicine

## 2018-06-01 VITALS — BP 142/72 | HR 68 | Temp 98.2°F | Ht 65.0 in | Wt 181.4 lb

## 2018-06-01 DIAGNOSIS — M25551 Pain in right hip: Secondary | ICD-10-CM | POA: Diagnosis not present

## 2018-06-01 DIAGNOSIS — N898 Other specified noninflammatory disorders of vagina: Secondary | ICD-10-CM | POA: Diagnosis not present

## 2018-06-01 DIAGNOSIS — G8929 Other chronic pain: Secondary | ICD-10-CM | POA: Diagnosis not present

## 2018-06-01 DIAGNOSIS — N6002 Solitary cyst of left breast: Secondary | ICD-10-CM | POA: Diagnosis not present

## 2018-06-01 DIAGNOSIS — Z124 Encounter for screening for malignant neoplasm of cervix: Secondary | ICD-10-CM | POA: Diagnosis not present

## 2018-06-01 LAB — POCT WET PREP (WET MOUNT)
Trichomonas Wet Prep HPF POC: ABSENT
WBC, Wet Prep HPF POC: 10

## 2018-06-01 MED ORDER — METRONIDAZOLE 500 MG PO TABS: 500.0000 mg | ORAL_TABLET | Freq: Two times a day (BID) | ORAL | 0 refills | Status: DC

## 2018-06-01 NOTE — Assessment & Plan Note (Signed)
Wet prep obtained today, consistent with BV, will prescribe Flagyl x7 days.

## 2018-06-01 NOTE — Addendum Note (Signed)
Addended by: Caro LarocheUMBALL, ALISON M on: 06/01/2018 03:39 PM   Modules accepted: Orders

## 2018-06-01 NOTE — Patient Instructions (Signed)
It was great to see you!  Our plans for today:  - We are checking some labs today, we will call you or send you a letter if they are abnormal.  - We referred you to physical therapy for your chronic hip pain. Once you are established with them, we can consider tapering your Norco at that time. - We placed a referral for surgery to evaluation your breast cyst.  Take care and seek immediate care sooner if you develop any concerns.   Dr. Mollie Germanyumball [facility]

## 2018-06-01 NOTE — Assessment & Plan Note (Signed)
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  Patient requests surgical removal due to frequent irritation, order placed for referral for surgical consultation.

## 2018-06-01 NOTE — Assessment & Plan Note (Addendum)
Due to avascular necrosis of right hip.  On Norco 10-325 mg q6h PRN, using consistently 4 times per day.  Discussed dangers of frequent use for long duration and need to taper medication.  Patient agrees with this plan however requests establishment with physical therapy prior to taper or surgical consultation.  Believe this is reasonable, will place order.  After establishment with PT, will again discuss taper.  No refills needed today.  PMP aware reviewed without red flags.  UDS obtained today.

## 2018-06-04 LAB — TOXASSURE SELECT 13 (MW), URINE

## 2018-06-09 LAB — CYTOLOGY - PAP
Diagnosis: UNDETERMINED — AB
HPV: NOT DETECTED

## 2018-06-10 ENCOUNTER — Telehealth: Payer: Self-pay | Admitting: Family Medicine

## 2018-06-10 ENCOUNTER — Ambulatory Visit: Payer: Medicaid Other | Attending: Family Medicine | Admitting: Physical Therapy

## 2018-06-10 NOTE — Telephone Encounter (Signed)
Pt requesting her results from her pap last week. Please call pt back to discuss.

## 2018-06-10 NOTE — Telephone Encounter (Signed)
Will forward to Dr. Rumball.  Jazmin Hartsell,CMA  

## 2018-06-10 NOTE — Telephone Encounter (Signed)
Spoke to patient regarding results. ASCUS - will repeat PAP next year.

## 2018-06-25 ENCOUNTER — Other Ambulatory Visit: Payer: Self-pay | Admitting: Family Medicine

## 2018-06-25 DIAGNOSIS — G8929 Other chronic pain: Secondary | ICD-10-CM

## 2018-06-25 DIAGNOSIS — M25551 Pain in right hip: Principal | ICD-10-CM

## 2018-06-25 NOTE — Telephone Encounter (Signed)
Pt would like to have her Hydrocodone refilled.  °

## 2018-06-26 MED ORDER — HYDROCODONE-ACETAMINOPHEN 10-325 MG PO TABS: 1.0000 | ORAL_TABLET | Freq: Four times a day (QID) | ORAL | 0 refills | Status: DC | PRN

## 2018-06-29 ENCOUNTER — Telehealth: Payer: Self-pay | Admitting: *Deleted

## 2018-06-29 NOTE — Telephone Encounter (Signed)
"  I was seen there and scheduled a surgery for my Bunion.  I was under the impression that this surgery would be January 14 but no time.  I was told to verify a time.  I think I was given the wrong information.  [facility] said they don't have any information for me.  Please give me a call."

## 2018-07-07 ENCOUNTER — Encounter: Payer: Self-pay | Admitting: Podiatry

## 2018-07-07 DIAGNOSIS — M2012 Hallux valgus (acquired), left foot: Secondary | ICD-10-CM | POA: Diagnosis not present

## 2018-07-07 DIAGNOSIS — M21612 Bunion of left foot: Secondary | ICD-10-CM | POA: Diagnosis not present

## 2018-07-07 DIAGNOSIS — M25572 Pain in left ankle and joints of left foot: Secondary | ICD-10-CM | POA: Diagnosis not present

## 2018-07-13 ENCOUNTER — Encounter: Payer: Self-pay | Admitting: Podiatry

## 2018-07-13 ENCOUNTER — Ambulatory Visit (INDEPENDENT_AMBULATORY_CARE_PROVIDER_SITE_OTHER): Payer: Medicaid Other | Admitting: Podiatry

## 2018-07-13 ENCOUNTER — Ambulatory Visit (INDEPENDENT_AMBULATORY_CARE_PROVIDER_SITE_OTHER): Payer: Medicaid Other

## 2018-07-13 VITALS — Temp 98.8°F

## 2018-07-13 DIAGNOSIS — M2012 Hallux valgus (acquired), left foot: Secondary | ICD-10-CM

## 2018-07-13 DIAGNOSIS — M2011 Hallux valgus (acquired), right foot: Secondary | ICD-10-CM

## 2018-07-13 NOTE — Patient Instructions (Signed)
Pre-Operative Instructions  Congratulations, you have decided to take an important step towards improving your quality of life.  You can be assured that the doctors and staff at [facility] will be with you every step of the way.  Here are some important things you should know:  1. Plan to be at the surgery center/hospital at least 1 (one) hour prior to your scheduled time, unless otherwise directed by the surgical center/hospital staff.  You must have a responsible adult accompany you, remain during the surgery and drive you home.  Make sure you have directions to the surgical center/hospital to ensure you arrive on time. 2. If you are having surgery at [facility] or [facility], you will need a copy of your medical history and physical form from your family physician within one month prior to the date of surgery. We will give you a form for your primary physician to complete.  3. We make every effort to accommodate the date you request for surgery.  However, there are times where surgery dates or times have to be moved.  We will contact you as soon as possible if a change in schedule is required.   4. No aspirin/ibuprofen for one week before surgery.  If you are on aspirin, any non-steroidal anti-inflammatory medications (Mobic, Aleve, Ibuprofen) should not be taken seven (7) days prior to your surgery.  You make take Tylenol for pain prior to surgery.  5. Medications - If you are taking daily heart and blood pressure medications, seizure, reflux, allergy, asthma, anxiety, pain or diabetes medications, make sure you notify the surgery center/hospital before the day of surgery so they can tell you which medications you should take or avoid the day of surgery. 6. No food or drink after midnight the night before surgery unless directed otherwise by surgical center/hospital staff. 7. No alcoholic beverages 24-hours prior to surgery.  No smoking 24-hours prior or 24-hours after  surgery. 8. Wear loose pants or shorts. They should be loose enough to fit over bandages, boots, and casts. 9. Don't wear slip-on shoes. Sneakers are preferred. 10. Bring your boot with you to the surgery center/hospital.  Also bring crutches or a walker if your physician has prescribed it for you.  If you do not have this equipment, it will be provided for you after surgery. 11. If you have not been contacted by the surgery center/hospital by the day before your surgery, call to confirm the date and time of your surgery. 12. Leave-time from work may vary depending on the type of surgery you have.  Appropriate arrangements should be made prior to surgery with your employer. 13. Prescriptions will be provided immediately following surgery by your doctor.  Fill these as soon as possible after surgery and take the medication as directed. Pain medications will not be refilled on weekends and must be approved by the doctor. 14. Remove nail polish on the operative foot and avoid getting pedicures prior to surgery. 15. Wash the night before surgery.  The night before surgery wash the foot and leg well with water and the antibacterial soap provided. Be sure to pay special attention to beneath the toenails and in between the toes.  Wash for at least three (3) minutes. Rinse thoroughly with water and dry well with a towel.  Perform this wash unless told not to do so by your physician.  Enclosed: 1 Ice pack (please put in freezer the night before surgery)   1 Hibiclens skin cleaner     Pre-op instructions  If you have any questions regarding the instructions, please do not hesitate to call our office.  [place]: [address], [place], [place] [address] -- [phone]  [place]: [address] , [place] [address] -- [phone]  [place]: [address]  [place], [place] [address] -- [phone]  [place]: [address], [address], [place], [place] [address] -- [phone]  Website:  [email or web address] 

## 2018-07-13 NOTE — Progress Notes (Signed)
Subjective:   Patient ID: Angela Walls, female   DOB: 38 y.o.   MRN: [id number]   HPI Patient presents stating my left foot is doing pretty well with patient admitting she is been quite active on it and states that she wants to try to get the right foot fixed relatively soon   ROS      Objective:  Physical Exam  Neurovascular status intact negative Homans sign noted with good healing clinically of the left first metatarsal wound edges well coapted hallux in rectus position good range of motion with no crepitus with structural bunion deformity right     Assessment:  Doing well post osteotomy first metatarsal left with structural bunion deformity right     Plan:  H&P discussed conditions and discussed continue immobilization for the left along with compression elevation and discussed correction of the right which will be done in 4 to 6 weeks  X-ray indicates there is healing to go but overall is doing well with fixation in place and no indications of movement

## 2018-07-14 NOTE — Telephone Encounter (Signed)
Someone from the surgical center called the patient and informed her of her arrival time for January 14.

## 2018-07-24 ENCOUNTER — Other Ambulatory Visit: Payer: Self-pay | Admitting: Family Medicine

## 2018-07-24 DIAGNOSIS — G8929 Other chronic pain: Secondary | ICD-10-CM

## 2018-07-24 DIAGNOSIS — M25551 Pain in right hip: Principal | ICD-10-CM

## 2018-07-24 NOTE — Telephone Encounter (Signed)
Pt is calling and would like to have her hydrocodone refilled.

## 2018-07-27 ENCOUNTER — Ambulatory Visit (INDEPENDENT_AMBULATORY_CARE_PROVIDER_SITE_OTHER): Payer: Medicaid Other | Admitting: Podiatry

## 2018-07-27 ENCOUNTER — Ambulatory Visit (INDEPENDENT_AMBULATORY_CARE_PROVIDER_SITE_OTHER): Payer: Medicaid Other

## 2018-07-27 DIAGNOSIS — Z09 Encounter for follow-up examination after completed treatment for conditions other than malignant neoplasm: Secondary | ICD-10-CM | POA: Diagnosis not present

## 2018-07-27 DIAGNOSIS — M2012 Hallux valgus (acquired), left foot: Secondary | ICD-10-CM | POA: Diagnosis not present

## 2018-07-27 DIAGNOSIS — M2011 Hallux valgus (acquired), right foot: Secondary | ICD-10-CM

## 2018-07-27 MED ORDER — HYDROCODONE-ACETAMINOPHEN 10-325 MG PO TABS: 1.0000 | ORAL_TABLET | Freq: Four times a day (QID) | ORAL | 0 refills | Status: DC | PRN

## 2018-07-27 NOTE — Addendum Note (Signed)
Addended by: Steva Colder on: 07/27/2018 04:44 PM   Modules accepted: Orders

## 2018-07-27 NOTE — Telephone Encounter (Signed)
PMP aware reviewed. Did have additional 7 day prescription for Percocet from Podiatry for bunion surgery as documented. No other red flags. Will prescribe refill with aim to taper once started PT.

## 2018-07-27 NOTE — Telephone Encounter (Signed)
Pt called nurse line [facility]. Pt requests for her hydrocodone [facility] [address] [facility] [place].   [facility] [address] [facility]- hydrocodone is on back order until the 15th. I cancelled this rx, as I could not transfer due to be a narcotic. I attempted several times [facility] [address], however was unsuccessful.   Please resent to alternate pharmacy.

## 2018-07-28 ENCOUNTER — Telehealth: Payer: Self-pay

## 2018-07-28 MED ORDER — HYDROCODONE-ACETAMINOPHEN 10-325 MG PO TABS: 1.0000 | ORAL_TABLET | Freq: Four times a day (QID) | ORAL | 0 refills | Status: DC | PRN

## 2018-07-28 NOTE — Telephone Encounter (Signed)
Prior approval for Norco completed via [employer].  Med approved for 07/28/18 - 01/24/19  Prior approval # [id number]. [facility].  Nigel Mormon, RN

## 2018-07-28 NOTE — Telephone Encounter (Signed)
[facility] requesting [facility] of Hydrocodone-Acetaminophen. Completed PA info in [employer]. Status pending. Will recheck status in 24 hours.  Ples Specter, RN [facility] RN)

## 2018-07-30 NOTE — Progress Notes (Signed)
   Subjective:  Patient presents today status post left bunionectomy. DOS: 07/07/2018. She states she is doing well. She reports some associated swelling and soreness. She denies modifying factors. She has been using the CAM boot as directed. Patient is here for further evaluation and treatment.    Past Medical History:  Diagnosis Date  . Anemia   . 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 On chronic narcotics for this.  . Hypertension   . Lupus (HCC)    Followed by Rheum. On chronic Prednisone.      Objective/Physical Exam Neurovascular status intact.  Skin incisions appear to be well coapted with sutures and staples intact. No sign of infectious process noted. No dehiscence. No active bleeding noted. Moderate edema noted to the surgical extremity.  Radiographic Exam:  Orthopedic hardware and osteotomies sites appear to be stable with routine healing.  Assessment: 1. s/p left bunionectomy. DOS: 07/07/2018   Plan of Care:  1. Patient was evaluated. X-rays reviewed 2. Visible suture removed.  3. Transition from using CAM boot into post op shoe.  4. Return to clinic in 4 weeks.    Felecia Shelling, DPM [facility]  Dr. Felecia Shelling, DPM    [address]                                        [place], [place] [address]                Office [phone]  Fax [phone]

## 2018-07-31 ENCOUNTER — Telehealth: Payer: Self-pay | Admitting: Podiatry

## 2018-08-03 ENCOUNTER — Other Ambulatory Visit: Payer: Self-pay | Admitting: Surgery

## 2018-08-03 ENCOUNTER — Telehealth: Payer: Self-pay

## 2018-08-03 DIAGNOSIS — L723 Sebaceous cyst: Secondary | ICD-10-CM | POA: Diagnosis not present

## 2018-08-03 DIAGNOSIS — L089 Local infection of the skin and subcutaneous tissue, unspecified: Secondary | ICD-10-CM | POA: Diagnosis not present

## 2018-08-03 NOTE — Telephone Encounter (Signed)
Spoke with patient advising her that she could take baths at this point, her incision line is healed. Any complications occur she is to call or come into the office to be seen.

## 2018-08-03 NOTE — Telephone Encounter (Signed)
I had surgery on 14 January and wanted to know if I can get in the tub to bathe now. Please call me back.

## 2018-08-03 NOTE — Telephone Encounter (Signed)
Pt called back following up on previous question. Stated she had called Friday and never heard anything back. I explained to patient that we had a power outage Friday and were unable to get voicemail's/make calls.

## 2018-08-10 ENCOUNTER — Telehealth: Payer: Self-pay | Admitting: *Deleted

## 2018-08-10 NOTE — Telephone Encounter (Signed)
"  I am scheduled to have surgery on March 3.  I'd like to push that back to the end of March.  I'm having another procedure done on March 9, so I need time to recover from that."  Dr. Charlsie Merles can do it on March 31.  "That will be perfect."  I'll get it rescheduled.  I changed the surgery date from 08/25/2018 to 09/22/2018 in [facility].

## 2018-08-24 ENCOUNTER — Encounter: Payer: Medicaid Other | Admitting: Podiatry

## 2018-08-24 NOTE — Pre-Procedure Instructions (Signed)
Angela Walls  08/24/2018      [id number] [place], [place] - [address] AT [facility] [address] [place] [address] Phone: [phone] Fax: [phone]  [facility] [address], [place] - [address] [place] [address] Phone: [phone] Fax: [phone]    Your procedure is scheduled on August 31, 2018.  Report to [facility] Entrance "A" at 700 AM.  Call this number if you have problems the morning of surgery:  [phone]   Remember:  Do not eat  after midnight.  You may drink clear liquids until 600 AM .  Clear liquids allowed are:      Water, Juice (non-citric and without pulp), Clear Tea, Black Coffee only and Gatorade    Take these medicines the morning of surgery with A SIP OF WATER  Metronidazole (Flagyl) Hydrocodone-acetaminophen (norco)-If needed  Beginning now, STOP taking any Aspirin (unless otherwise instructed by your surgeon), Aleve, Naproxen, Ibuprofen, Motrin, Advil, Goody's, BC's, all herbal medications, fish oil, and all vitamins    Do not wear jewelry, make-up or nail polish.  Do not wear lotions, powders, or perfumes, or deodorant.  Do not shave 48 hours prior to surgery.   Do not bring valuables to the hospital.  [facility] is not responsible for any belongings or valuables.  Contacts, dentures or bridgework may not be worn into surgery.  Leave your suitcase in the car.  After surgery it may be brought to your room.  For patients admitted to the hospital, discharge time will be determined by your treatment team.  Patients discharged the day of surgery will not be allowed to drive home.    [place]- Preparing For Surgery  Before surgery, you can play an important role. Because skin is not sterile, your skin needs to be as free of germs as possible. You can reduce the number of germs on your skin by washing with CHG (chlorahexidine gluconate) Soap  before surgery.  CHG is an antiseptic cleaner which kills germs and bonds with the skin to continue killing germs even after washing.    Oral Hygiene is also important to reduce your risk of infection.  Remember - BRUSH YOUR TEETH THE MORNING OF SURGERY WITH YOUR REGULAR TOOTHPASTE  Please do not use if you have an allergy to CHG or antibacterial soaps. If your skin becomes reddened/irritated stop using the CHG.  Do not shave (including legs and underarms) for at least 48 hours prior to first CHG shower. It is OK to shave your face.  Please follow these instructions carefully.   1. Shower the NIGHT BEFORE SURGERY and the MORNING OF SURGERY with CHG.   2. If you chose to wash your hair, wash your hair first as usual with your normal shampoo.  3. After you shampoo, rinse your hair and body thoroughly to remove the shampoo.  4. Use CHG as you would any other liquid soap. You can apply CHG directly to the skin and wash gently with a scrungie or a clean washcloth.   5. Apply the CHG Soap to your body ONLY FROM THE NECK DOWN.  Do not use on open wounds or open sores. Avoid contact with your eyes, ears, mouth and genitals (private parts). Wash Face and genitals (private parts)  with your normal soap.  6. Wash thoroughly, paying special attention to the area where your surgery will be performed.  7. Thoroughly rinse your body with  warm water from the neck down.  8. DO NOT shower/wash with your normal soap after using and rinsing off the CHG Soap.  9. Pat yourself dry with a CLEAN TOWEL.  10. Wear CLEAN PAJAMAS to bed the night before surgery, wear comfortable clothes the morning of surgery  11. Place CLEAN SHEETS on your bed the night of your first shower and DO NOT SLEEP WITH PETS.   Day of Surgery:  Do not apply any deodorants/lotions.  Please wear clean clothes to the hospital/surgery center.   Remember to brush your teeth WITH YOUR REGULAR TOOTHPASTE.   Please read over the  following fact sheets that you were given.

## 2018-08-25 ENCOUNTER — Encounter (HOSPITAL_COMMUNITY): Payer: Self-pay

## 2018-08-25 ENCOUNTER — Other Ambulatory Visit: Payer: Self-pay

## 2018-08-25 ENCOUNTER — Encounter (HOSPITAL_COMMUNITY)
Admission: RE | Admit: 2018-08-25 | Discharge: 2018-08-25 | Disposition: A | Discharge status: Home / Self Care | Payer: Medicaid Other | Source: Ambulatory Visit | Attending: Surgery | Admitting: Surgery

## 2018-08-25 DIAGNOSIS — Z01812 Encounter for preprocedural laboratory examination: Secondary | ICD-10-CM | POA: Insufficient documentation

## 2018-08-25 DIAGNOSIS — M25551 Pain in right hip: Principal | ICD-10-CM

## 2018-08-25 DIAGNOSIS — G8929 Other chronic pain: Secondary | ICD-10-CM

## 2018-08-25 LAB — CBC
HCT: 33.6 % — ABNORMAL LOW (ref 36.0–46.0)
Hemoglobin: 10.6 g/dL — ABNORMAL LOW (ref 12.0–15.0)
MCH: 28.7 pg (ref 26.0–34.0)
MCHC: 31.5 g/dL (ref 30.0–36.0)
MCV: 91.1 fL (ref 80.0–100.0)
Platelets: 334 10*3/uL (ref 150–400)
RBC: 3.69 MIL/uL — ABNORMAL LOW (ref 3.87–5.11)
RDW: 13.2 % (ref 11.5–15.5)
WBC: 5.2 10*3/uL (ref 4.0–10.5)
nRBC: 0 % (ref 0.0–0.2)

## 2018-08-25 NOTE — Progress Notes (Signed)
PCP - [place] Dense, DO Cardiologist - denies  Chest x-ray - N/A EKG - N/A Stress Test - denies ECHO - 10+ years ago; for possible heart murmur. Pt denies any further work up. Stated ECHO was normal. Pt cant remember where echo was done.  Cardiac Cath - denies  Sleep Study - denies CPAP - denies  Blood Thinner Instructions: N/A Aspirin Instructions:N/A  Anesthesia review: No  Patient denies shortness of breath, fever, cough and chest pain at PAT appointment   Patient verbalized understanding of instructions that were given to them at the PAT appointment. Patient was also instructed that they will need to review over the PAT instructions again at home before surgery.

## 2018-08-27 ENCOUNTER — Ambulatory Visit (INDEPENDENT_AMBULATORY_CARE_PROVIDER_SITE_OTHER): Payer: Medicaid Other | Admitting: Podiatry

## 2018-08-27 ENCOUNTER — Encounter: Payer: Self-pay | Admitting: Podiatry

## 2018-08-27 ENCOUNTER — Ambulatory Visit (INDEPENDENT_AMBULATORY_CARE_PROVIDER_SITE_OTHER): Payer: Medicaid Other

## 2018-08-27 ENCOUNTER — Other Ambulatory Visit: Payer: Self-pay | Admitting: Podiatry

## 2018-08-27 DIAGNOSIS — M2012 Hallux valgus (acquired), left foot: Secondary | ICD-10-CM

## 2018-08-27 DIAGNOSIS — M2011 Hallux valgus (acquired), right foot: Secondary | ICD-10-CM

## 2018-08-27 DIAGNOSIS — M779 Enthesopathy, unspecified: Secondary | ICD-10-CM

## 2018-08-27 MED ORDER — HYDROCODONE-ACETAMINOPHEN 10-325 MG PO TABS: 1.0000 | ORAL_TABLET | Freq: Four times a day (QID) | ORAL | 0 refills | Status: DC | PRN

## 2018-08-27 NOTE — Telephone Encounter (Signed)
Patient calling to check on this.  Ples Specter, RN [facility] RN)

## 2018-08-27 NOTE — Telephone Encounter (Signed)
Text page sent at 12:29 to PCP. Ples Specter, RN [facility] RN)

## 2018-08-27 NOTE — Telephone Encounter (Signed)
Plan was for patient to start taper of norco after establishing with PT. Has patient done this? Doesn't look like it from the chart. Will refill this time but will either need to establish with PT per our last visit or she will need to come for an appointment to discuss before her next refill is due. Please let patient know. Thanks!

## 2018-08-27 NOTE — Progress Notes (Signed)
Subjective:   Patient ID: Angela Walls, female   DOB: 38 y.o.   MRN: [id number]   HPI Patient states doing much better with minimal discomfort and states that she has been in her surgical shoe   ROS      Objective:  Physical Exam  Neurovascular status intact negative Homans sign noted with patient's bunion site doing very well with reduction of ankle wound edges well coapted     Assessment:  Doing very well post osteotomy left     Plan:  X-ray and advised on gradual return to soft shoe gear and the importance of range of motion exercises which she will continue.  Overall doing very well.  Reappoint to recheck 6 weeks  X-ray indicates osteotomies healing well fixation in place joint congruence.  There is slight movement of the osteotomy site but it is minimal and it is healing in and should not be any issue for this patient long-term

## 2018-08-28 NOTE — Telephone Encounter (Signed)
Spoke with Ms. Arlana Pouch. She stated that she just had foot surgery Jan 14 and that she had another surgery schd. To have a cyst removed on March 19, so she is not going to be doing PT anytime soon. Pt feels that her not doing PT has anything to do with her pain meds. In fact that her pain meds is not even suppose to stop! So she wants Dr. Linwood Dibbles to call her so she can talk to her about this. Pt seemed mad about it. Aquilla Solian, CMA

## 2018-08-28 NOTE — Telephone Encounter (Signed)
Ok to wait until healed from surgery to make changes. Looks like surgery is scheduled for 3/9 so should be healed by the time her Rx is due again if everything goes well. Goal for PT is for stretching and range of motion exercises for her hip. She ultimately would need to have a hip replacement though although I know she has been unwilling in the past.

## 2018-08-30 NOTE — H&P (Signed)
Angela Walls Documented: 08/03/2018 3:11 PM Location: [facility] Patient #: [id number] DOB: 1981/05/02 Single / Language: [name] / Race: Black or African American Female   History of Present Illness (Angela Prunty A. Magnus Ivan MD; 08/03/2018 3:31 PM) The patient is a 38 year old female who presents with a complaint of Breast problems. This patient is referred by Dr. Pearlean Brownie for a probable infected sebaceous cyst on the left breast. The patient reported noticing this is for sometime but it recently got infected requiring incision and drainage. She has a history of lupus. She has no prior history of sebaceous cysts. She is otherwise without complaints.   Past Surgical History Angela Walls, CMA; 08/03/2018 3:11 PM) Foot Surgery  Left. Oral Surgery   Diagnostic Studies History Angela Walls, CMA; 08/03/2018 3:11 PM) Colonoscopy  never Mammogram  within last year Pap Smear  1-5 years ago  Allergies Angela Walls, CMA; 08/03/2018 3:12 PM) Penicillins   Medication History Angela Walls, CMA; 08/03/2018 3:12 PM) Ondansetron HCl (4MG  Tablet, Oral) Active. oxyCODONE-Acetaminophen (10-325MG  Tablet, Oral) Active. Medications Reconciled  Pregnancy / Birth History Angela Walls, CMA; 08/03/2018 3:11 PM) Age at menarche  15 years. Gravida  3 Irregular periods  Length (months) of breastfeeding  7-12 Maternal age  64-20 Para  2  Other Problems Angela Walls, CMA; 08/03/2018 3:11 PM) High blood pressure     Review of Systems (Angela Walls CMA; 08/03/2018 3:11 PM) General Not Present- Appetite Loss, Chills, Fatigue, Fever, Night Sweats, Weight Gain and Weight Loss. Skin Not Present- Change in Wart/Mole, Dryness, Hives, Jaundice, New Lesions, Non-Healing Wounds, Rash and Ulcer. HEENT Not Present- Earache, Hearing Loss, Hoarseness, Nose Bleed, Oral Ulcers, Ringing in the Ears, Seasonal Allergies, Sinus Pain, Sore Throat, Visual Disturbances, Wears  glasses/contact lenses and Yellow Eyes. Respiratory Not Present- Bloody sputum, Chronic Cough, Difficulty Breathing, Snoring and Wheezing. Breast Present- Breast Mass. Not Present- Breast Pain, Nipple Discharge and Skin Changes. Cardiovascular Not Present- Chest Pain, Difficulty Breathing Lying Down, Leg Cramps, Palpitations, Rapid Heart Rate, Shortness of Breath and Swelling of Extremities. Gastrointestinal Not Present- Abdominal Pain, Bloating, Bloody Stool, Change in Bowel Habits, Chronic diarrhea, Constipation, Difficulty Swallowing, Excessive gas, Gets full quickly at meals, Hemorrhoids, Indigestion, Nausea, Rectal Pain and Vomiting. Female Genitourinary Not Present- Frequency, Nocturia, Painful Urination, Pelvic Pain and Urgency. Musculoskeletal Not Present- Back Pain, Joint Pain, Joint Stiffness, Muscle Pain, Muscle Weakness and Swelling of Extremities. Neurological Not Present- Decreased Memory, Fainting, Headaches, Numbness, Seizures, Tingling, Tremor, Trouble walking and Weakness. Psychiatric Not Present- Anxiety, Bipolar, Change in Sleep Pattern, Depression, Fearful and Frequent crying. Endocrine Not Present- Cold Intolerance, Excessive Hunger, Hair Changes, Heat Intolerance, Hot flashes and New Diabetes. Hematology Not Present- Blood Thinners, Easy Bruising, Excessive bleeding, Gland problems, HIV and Persistent Infections.  Vitals (Angela Walls CMA; 08/03/2018 3:13 PM) 08/03/2018 3:13 PM Weight: 180.8 lb Height: 65in Body Surface Area: 1.9 m Body Mass Index: 30.09 kg/m  BP: 130/70 (Sitting, Left Arm, Standard)       Physical Exam (Brittanie Dosanjh A. Magnus Ivan MD; 08/03/2018 3:31 PM) The physical exam findings are as follows: Note:On exam, there is a 1.5 cm cyst medially on the left breast along the inframammary ridge. There is no further erythema but it is tender Lungs clear CV RRR Abdomen soft, NT/ND   Assessment & Plan (Jakyla Reza A. Magnus Ivan MD; 08/03/2018 3:32 PM) INFECTED  SEBACEOUS CYST (L72.3) Impression: Given her history of lupus and need for occasional steroids as well as the previous infection of the cyst and its  location on the breast, surgical excision of the chronically infected left breast sebaceous cyst is strongly recommended. We discussed the reasons for this. I discussed the surgical procedure in detail. We discussed the risks of bleeding, ongoing infection, recurrence, the need for further procedures, etc. She understands and wishes to proceed with surgery

## 2018-08-31 ENCOUNTER — Ambulatory Visit (HOSPITAL_COMMUNITY): Payer: Medicaid Other | Admitting: Certified Registered Nurse Anesthetist

## 2018-08-31 ENCOUNTER — Ambulatory Visit (HOSPITAL_COMMUNITY)
Admission: RE | Admit: 2018-08-31 | Discharge: 2018-08-31 | Disposition: A | Discharge status: Home / Self Care | Payer: Medicaid Other | Attending: Surgery | Admitting: Surgery

## 2018-08-31 ENCOUNTER — Encounter (HOSPITAL_COMMUNITY)
Admission: RE | Disposition: A | Discharge status: Home / Self Care | Payer: Self-pay | Source: Home / Self Care | Attending: Surgery

## 2018-08-31 ENCOUNTER — Encounter (HOSPITAL_COMMUNITY): Payer: Self-pay

## 2018-08-31 ENCOUNTER — Other Ambulatory Visit: Payer: Self-pay

## 2018-08-31 DIAGNOSIS — Z79899 Other long term (current) drug therapy: Secondary | ICD-10-CM | POA: Diagnosis not present

## 2018-08-31 DIAGNOSIS — I10 Essential (primary) hypertension: Secondary | ICD-10-CM | POA: Insufficient documentation

## 2018-08-31 DIAGNOSIS — L723 Sebaceous cyst: Secondary | ICD-10-CM | POA: Diagnosis present

## 2018-08-31 DIAGNOSIS — L720 Epidermal cyst: Secondary | ICD-10-CM | POA: Diagnosis not present

## 2018-08-31 DIAGNOSIS — N6082 Other benign mammary dysplasias of left breast: Secondary | ICD-10-CM | POA: Diagnosis not present

## 2018-08-31 DIAGNOSIS — Z880 Allergy status to penicillin: Secondary | ICD-10-CM | POA: Insufficient documentation

## 2018-08-31 HISTORY — PX: BREAST CYST EXCISION: SHX579

## 2018-08-31 HISTORY — PX: LIPOMA EXCISION: SHX5283

## 2018-08-31 LAB — POCT PREGNANCY, URINE: Preg Test, Ur: NEGATIVE

## 2018-08-31 SURGERY — EXCISION, CYST, BREAST
Anesthesia: General | Site: Breast | Laterality: Right

## 2018-08-31 MED ORDER — DEXAMETHASONE SODIUM PHOSPHATE 10 MG/ML IJ SOLN
INTRAMUSCULAR | Status: DC | PRN
  Administered 2018-08-31: 10 mg via INTRAVENOUS

## 2018-08-31 MED ORDER — MIDAZOLAM HCL 2 MG/2ML IJ SOLN
INTRAMUSCULAR | Status: AC
  Filled 2018-08-31: qty 2

## 2018-08-31 MED ORDER — MIDAZOLAM HCL 5 MG/5ML IJ SOLN
INTRAMUSCULAR | Status: DC | PRN
  Administered 2018-08-31: 2 mg via INTRAVENOUS

## 2018-08-31 MED ORDER — ONDANSETRON HCL 4 MG/2ML IJ SOLN
INTRAMUSCULAR | Status: DC | PRN
  Administered 2018-08-31: 4 mg via INTRAVENOUS

## 2018-08-31 MED ORDER — BUPIVACAINE-EPINEPHRINE (PF) 0.25% -1:200000 IJ SOLN
INTRAMUSCULAR | Status: AC
  Filled 2018-08-31: qty 30

## 2018-08-31 MED ORDER — GABAPENTIN 300 MG PO CAPS
300.0000 mg | ORAL_CAPSULE | ORAL | Status: AC
  Administered 2018-08-31: 300 mg via ORAL
  Filled 2018-08-31: qty 1

## 2018-08-31 MED ORDER — PHENYLEPHRINE 40 MCG/ML (10ML) SYRINGE FOR IV PUSH (FOR BLOOD PRESSURE SUPPORT)
PREFILLED_SYRINGE | INTRAVENOUS | Status: DC | PRN
  Administered 2018-08-31 (×2): 40 ug via INTRAVENOUS

## 2018-08-31 MED ORDER — CHLORHEXIDINE GLUCONATE CLOTH 2 % EX PADS: 6.0000 | MEDICATED_PAD | Freq: Once | CUTANEOUS | Status: DC

## 2018-08-31 MED ORDER — FENTANYL CITRATE (PF) 100 MCG/2ML IJ SOLN
INTRAMUSCULAR | Status: DC | PRN
  Administered 2018-08-31 (×3): 25 ug via INTRAVENOUS

## 2018-08-31 MED ORDER — PROPOFOL 10 MG/ML IV BOLUS
INTRAVENOUS | Status: DC | PRN
  Administered 2018-08-31: 200 mg via INTRAVENOUS

## 2018-08-31 MED ORDER — LIDOCAINE 2% (20 MG/ML) 5 ML SYRINGE
INTRAMUSCULAR | Status: AC
  Filled 2018-08-31: qty 5

## 2018-08-31 MED ORDER — OXYCODONE HCL 5 MG PO TABS: 5.0000 mg | ORAL_TABLET | Freq: Four times a day (QID) | ORAL | 0 refills | Status: DC | PRN

## 2018-08-31 MED ORDER — ONDANSETRON HCL 4 MG/2ML IJ SOLN
INTRAMUSCULAR | Status: AC
  Filled 2018-08-31: qty 2

## 2018-08-31 MED ORDER — ACETAMINOPHEN 500 MG PO TABS
1000.0000 mg | ORAL_TABLET | ORAL | Status: AC
  Administered 2018-08-31: 1000 mg via ORAL
  Filled 2018-08-31: qty 2

## 2018-08-31 MED ORDER — PROPOFOL 10 MG/ML IV BOLUS
INTRAVENOUS | Status: AC
  Filled 2018-08-31: qty 20

## 2018-08-31 MED ORDER — CIPROFLOXACIN IN D5W 400 MG/200ML IV SOLN
400.0000 mg | INTRAVENOUS | Status: AC
  Administered 2018-08-31: 400 mg via INTRAVENOUS
  Filled 2018-08-31: qty 200

## 2018-08-31 MED ORDER — BUPIVACAINE-EPINEPHRINE 0.25% -1:200000 IJ SOLN
INTRAMUSCULAR | Status: DC | PRN
  Administered 2018-08-31: 20 mL

## 2018-08-31 MED ORDER — LIDOCAINE 2% (20 MG/ML) 5 ML SYRINGE
INTRAMUSCULAR | Status: DC | PRN
  Administered 2018-08-31: 100 mg via INTRAVENOUS

## 2018-08-31 MED ORDER — FENTANYL CITRATE (PF) 250 MCG/5ML IJ SOLN
INTRAMUSCULAR | Status: AC
  Filled 2018-08-31: qty 5

## 2018-08-31 MED ORDER — DEXAMETHASONE SODIUM PHOSPHATE 10 MG/ML IJ SOLN
INTRAMUSCULAR | Status: AC
  Filled 2018-08-31: qty 1

## 2018-08-31 MED ORDER — FENTANYL CITRATE (PF) 100 MCG/2ML IJ SOLN: 25.0000 ug | INTRAMUSCULAR | Status: DC | PRN

## 2018-08-31 MED ORDER — LACTATED RINGERS IV SOLN
INTRAVENOUS | Status: DC
  Administered 2018-08-31: 08:00:00 via INTRAVENOUS

## 2018-08-31 MED ORDER — CELECOXIB 200 MG PO CAPS
200.0000 mg | ORAL_CAPSULE | ORAL | Status: AC
  Administered 2018-08-31: 200 mg via ORAL
  Filled 2018-08-31: qty 1

## 2018-08-31 SURGICAL SUPPLY — 39 items
ADH SKN CLS APL DERMABOND .7 (GAUZE/BANDAGES/DRESSINGS) ×4
BINDER BREAST LRG (GAUZE/BANDAGES/DRESSINGS) IMPLANT
BINDER BREAST XLRG (GAUZE/BANDAGES/DRESSINGS) IMPLANT
CANISTER SUCT 3000ML PPV (MISCELLANEOUS) IMPLANT
CHLORAPREP W/TINT 26ML (MISCELLANEOUS) ×3 IMPLANT
COVER SURGICAL LIGHT HANDLE (MISCELLANEOUS) ×3 IMPLANT
COVER WAND RF STERILE (DRAPES) ×2 IMPLANT
DECANTER SPIKE VIAL GLASS SM (MISCELLANEOUS) ×3 IMPLANT
DERMABOND ADVANCED (GAUZE/BANDAGES/DRESSINGS) ×2
DERMABOND ADVANCED .7 DNX12 (GAUZE/BANDAGES/DRESSINGS) ×2 IMPLANT
DEVICE DUBIN SPECIMEN MAMMOGRA (MISCELLANEOUS) IMPLANT
DRAPE LAPAROTOMY 100X72 PEDS (DRAPES) ×3 IMPLANT
DRSG TEGADERM 4X4.75 (GAUZE/BANDAGES/DRESSINGS) ×3 IMPLANT
ELECT REM PT RETURN 9FT ADLT (ELECTROSURGICAL) ×3
ELECTRODE REM PT RTRN 9FT ADLT (ELECTROSURGICAL) ×2 IMPLANT
GAUZE 4X4 16PLY RFD (DISPOSABLE) ×3 IMPLANT
GAUZE SPONGE 2X2 8PLY STRL LF (GAUZE/BANDAGES/DRESSINGS) ×2 IMPLANT
GLOVE SURG SIGNA 7.5 PF LTX (GLOVE) ×3 IMPLANT
GOWN STRL REUS W/ TWL LRG LVL3 (GOWN DISPOSABLE) ×2 IMPLANT
GOWN STRL REUS W/ TWL XL LVL3 (GOWN DISPOSABLE) ×2 IMPLANT
GOWN STRL REUS W/TWL LRG LVL3 (GOWN DISPOSABLE) ×3
GOWN STRL REUS W/TWL XL LVL3 (GOWN DISPOSABLE) ×3
KIT BASIN OR (CUSTOM PROCEDURE TRAY) ×3 IMPLANT
KIT TURNOVER KIT B (KITS) ×3 IMPLANT
NDL HYPO 25GX1X1/2 BEV (NEEDLE) ×2 IMPLANT
NEEDLE HYPO 25GX1X1/2 BEV (NEEDLE) ×3 IMPLANT
NS IRRIG 1000ML POUR BTL (IV SOLUTION) ×3 IMPLANT
PACK GENERAL/GYN (CUSTOM PROCEDURE TRAY) ×3 IMPLANT
PAD ARMBOARD 7.5X6 YLW CONV (MISCELLANEOUS) ×6 IMPLANT
PENCIL SMOKE EVACUATOR (MISCELLANEOUS) ×3 IMPLANT
SPECIMEN JAR MEDIUM (MISCELLANEOUS) ×3 IMPLANT
SPONGE GAUZE 2X2 STER 10/PKG (GAUZE/BANDAGES/DRESSINGS) ×1
SUT MNCRL AB 4-0 PS2 18 (SUTURE) ×3 IMPLANT
SUT SILK 2 0 PERMA HAND 18 BK (SUTURE) IMPLANT
SUT VIC AB 3-0 SH 27 (SUTURE) ×3
SUT VIC AB 3-0 SH 27X BRD (SUTURE) ×2 IMPLANT
SYR CONTROL 10ML LL (SYRINGE) ×3 IMPLANT
TOWEL OR 17X24 6PK STRL BLUE (TOWEL DISPOSABLE) ×3 IMPLANT
TOWEL OR 17X26 10 PK STRL BLUE (TOWEL DISPOSABLE) ×3 IMPLANT

## 2018-08-31 NOTE — Anesthesia Postprocedure Evaluation (Signed)
Anesthesia Post Note  Patient: Angela Walls  Procedure(s) Performed: EXCISION LEFT BREAST CYST (Left Breast) Excision right arm cyst (Right Arm Upper)     Patient location during evaluation: PACU Anesthesia Type: General Level of consciousness: awake Pain management: pain level controlled Vital Signs Assessment: post-procedure vital signs reviewed and stable Respiratory status: spontaneous breathing Cardiovascular status: stable Postop Assessment: no apparent nausea or vomiting Anesthetic complications: no    Last Vitals:  Vitals:   08/31/18 0659 08/31/18 0939  BP: (!) 151/92 121/77  Pulse: 74 71  Resp: 20 19  Temp: (!) 36.4 C (!) (P) 36.2 C  SpO2: 100% 100%    Last Pain:  Vitals:   08/31/18 0939  TempSrc:   PainSc: (P) 0-No pain                 Yiannis Tulloch

## 2018-08-31 NOTE — Anesthesia Procedure Notes (Signed)
Procedure Name: LMA Insertion Date/Time: 08/31/2018 8:56 AM Performed by: Margarita Rana, CRNA Pre-anesthesia Checklist: Patient identified, Emergency Drugs available, Suction available and Patient being monitored Patient Re-evaluated:Patient Re-evaluated prior to induction Oxygen Delivery Method: Circle System Utilized Preoxygenation: Pre-oxygenation with 100% oxygen Induction Type: IV induction Ventilation: Mask ventilation without difficulty LMA: LMA inserted LMA Size: 4.0 Number of attempts: 1 Airway Equipment and Method: Bite block Placement Confirmation: positive ETCO2 and breath sounds checked- equal and bilateral Tube secured with: Tape Dental Injury: Teeth and Oropharynx as per pre-operative assessment  Comments: LMA inserted by Terrence Dupont, under supervision of Vauda Salvucci CRNA>

## 2018-08-31 NOTE — Anesthesia Preprocedure Evaluation (Addendum)
Anesthesia Evaluation  Patient identified by MRN, date of birth, ID band Patient awake    Reviewed: Allergy & Precautions, NPO status   History of Anesthesia Complications (+) PONV  Airway Mallampati: II  TM Distance: >3 FB     Dental  (+) Teeth Intact, Dental Advisory Given   Pulmonary    breath sounds clear to auscultation       Cardiovascular hypertension, + Valvular Problems/Murmurs  Rhythm:Regular Rate:Normal     Neuro/Psych    GI/Hepatic negative GI ROS, Neg liver ROS,   Endo/Other  negative endocrine ROS  Renal/GU negative Renal ROS     Musculoskeletal   Abdominal   Peds  Hematology   Anesthesia Other Findings   Reproductive/Obstetrics                            Anesthesia Physical Anesthesia Plan  ASA: III  Anesthesia Plan: General   Post-op Pain Management:    Induction: Intravenous  PONV Risk Score and Plan: 3 and Dexamethasone, Midazolam and Ondansetron  Airway Management Planned: LMA  Additional Equipment:   Intra-op Plan:   Post-operative Plan: Extubation in OR  Informed Consent: I have reviewed the patients History and Physical, chart, labs and discussed the procedure including the risks, benefits and alternatives for the proposed anesthesia with the patient or authorized representative who has indicated his/her understanding and acceptance.     Dental advisory given  Plan Discussed with: CRNA and Anesthesiologist  Anesthesia Plan Comments:         Anesthesia Quick Evaluation

## 2018-08-31 NOTE — Op Note (Signed)
EXCISION LEFT BREAST CYST, Excision right arm cyst  Procedure Note  ASHLAY BENTON 08/31/2018   Pre-op Diagnosis: CHRONICALLY INFECTED SEBACEOUS CYST LEFT BREAST, ABNORMAL SKIN LESION RIGHT UPPER ARM     Post-op Diagnosis: same  Procedure(s): EXCISION LEFT BREAST SEBACEOUS CYST (3 CM) EXCISION ABNORMAL SKIN LESION RIGHT UPPER ARM ( 1 CM)  Surgeon(s): Abigail Miyamoto, MD  Anesthesia: Choice  Staff:  Circulator: Doy Mince, RN; Small, Wyline Beady, RN Scrub Person: Josefa Half, RN  Estimated Blood Loss: Minimal               Specimens: sent to path  Indications: This is a 38 year old female with a 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  At the day of surgery, she made me aware of an abnormal skin lesion on her right upper arm.  It is 1 cm in size and has the appearance of a potential skin malignancy.  Removal of this is recommended at the same time.  Procedure: The patient was brought to the operating room and identified as correct patient.  She was placed upon the operating table and general anesthesia was induced.  Her chest and right upper arm were then prepped and draped in usual sterile fashion.  I anesthetized the skin around the sebaceous cyst at the medial edge of the left breast with Marcaine.  I made elliptical incision with a scalpel excising not only the cyst but the scar on the skin from a previous incision and drainage.  I then dissected down into the subcutaneous tissue and breast tissue with the cautery and excised the cyst in its entirety.  Hemostasis was achieved.  I then closed the subcutaneous tissue with interrupted 3-0 Vicryl sutures and closed the skin with a running 4-0 Monocryl.  Dermabond was then applied. Next, I anesthetized the skin around the lesion on the right upper arm with Marcaine.  I made elliptical incision with a scalpel and remove the small 1 cm normal-appearing skin lesion with a scalpel in  its entirety.  Hemostasis was achieved with the cautery.  I then closed the subcutaneous tissue with interrupted 3-0 Vicryl sutures and closed the skin with a running 4-0 Monocryl.  Dermabond was applied as well.  The patient tolerated the procedure well.  All sponge, needle, and instrument count was correct at the end of the procedure.  The patient was then extubated in the operating room and taken in a stable condition to the recovery room.          Abigail Miyamoto   Date: 08/31/2018  Time: 9:27 AM

## 2018-08-31 NOTE — Transfer of Care (Signed)
Immediate Anesthesia Transfer of Care Note  Patient: Angela Walls  Procedure(s) Performed: EXCISION LEFT BREAST CYST (Left Breast) Excision right arm cyst (Right Arm Upper)  Patient Location: PACU  Anesthesia Type:General  Level of Consciousness: awake, patient cooperative and responds to stimulation  Airway & Oxygen Therapy: Patient Spontanous Breathing and Patient connected to nasal cannula oxygen  Post-op Assessment: Report given to RN and Post -op Vital signs reviewed and stable  Post vital signs: Reviewed and stable  Last Vitals:  Vitals Value Taken Time  BP    Temp    Pulse    Resp    SpO2      Last Pain:  Vitals:   08/31/18 0733  TempSrc:   PainSc: 0-No pain         Complications: No apparent anesthesia complications

## 2018-08-31 NOTE — Interval H&P Note (Signed)
History and Physical Interval Note: patient has an abnormal skin lesion on her right upper arm that is concerning and she would like excised today as well.  The left breast sebaceous cyst is unchanged. Will excise both skin lesions today.  08/31/2018 7:13 AM  Angela Walls  has presented today for surgery, with the diagnosis of CHRONICALLY INFECTED SEBACEOUS CYST LEFT BREAST, ABNORMAL SKIN LESION RIGHT UPPER ARM  The various methods of treatment have been discussed with the patient and family. After consideration of risks, benefits and other options for treatment, the patient has consented to  Procedure(s): EXCISION LEFT BREAST CYST (Left) AND EXCISION RIGHT ARM ABNORMAL SKIN LESION as a surgical intervention.  The patient's history has been reviewed, patient examined, no change in status, stable for surgery.  I have reviewed the patient's chart and labs.  Questions were answered to the patient's satisfaction.     Abigail Miyamoto

## 2018-08-31 NOTE — Discharge Instructions (Signed)
Ok to shower starting tomorrow   Ice pack, Tylenol, and ibuprofen also for pain   No vigorous activity for 1 week 

## 2018-09-01 ENCOUNTER — Encounter (HOSPITAL_COMMUNITY): Payer: Self-pay | Admitting: Surgery

## 2018-09-16 ENCOUNTER — Telehealth: Payer: Self-pay | Admitting: Family Medicine

## 2018-09-16 ENCOUNTER — Other Ambulatory Visit: Payer: Self-pay | Admitting: Family Medicine

## 2018-09-16 ENCOUNTER — Telehealth: Payer: Self-pay | Admitting: *Deleted

## 2018-09-16 NOTE — Telephone Encounter (Signed)
Patient requesting refill on ketoconazole for her scalp. She states that her head is itching again. Please call patient back to discuss.

## 2018-09-16 NOTE — Telephone Encounter (Signed)
Returned patient call however had to leave a message.  Patient has not been prescribed ketoconazole in a few years so would need to be worked up depending on her concerns. This could likely be handled as a telemedicine visit vs office visit depending on patient preference.

## 2018-09-16 NOTE — Telephone Encounter (Signed)
I attempted to call the patient to reschedule her surgery.  I left her a message to call me back.  I informed her that I need to reschedule her appointment that's scheduled for March 31.

## 2018-09-17 NOTE — Telephone Encounter (Signed)
Spoke with pt about discussing getting ketoconazole RX. Asked pt if she was ok with doing  telemedicine or office visit. Pt said she was fine with doing telemedicine. Aquilla Solian, CMA

## 2018-09-17 NOTE — Telephone Encounter (Signed)
I attempted to call Angela Walls again.  I left her another message to call me back to reschedule her appointment.

## 2018-09-21 ENCOUNTER — Telehealth (INDEPENDENT_AMBULATORY_CARE_PROVIDER_SITE_OTHER): Payer: Medicaid Other | Admitting: Family Medicine

## 2018-09-21 ENCOUNTER — Ambulatory Visit: Payer: Medicaid Other | Admitting: Family Medicine

## 2018-09-21 ENCOUNTER — Other Ambulatory Visit: Payer: Self-pay

## 2018-09-21 DIAGNOSIS — L659 Nonscarring hair loss, unspecified: Secondary | ICD-10-CM

## 2018-09-21 MED ORDER — CLOBETASOL PROPIONATE 0.05 % EX CREA: 1.0000 "application " | TOPICAL_CREAM | Freq: Two times a day (BID) | CUTANEOUS | 2 refills | Status: DC

## 2018-09-21 NOTE — Assessment & Plan Note (Signed)
Per chart review it appears PCP gave trial of clobetazole which seemed to help patient.  Given that patient improved with this will give refill of cream.  Patient is very appreciative.  Strict return precautions given.  Follow-up with PCP as needed.

## 2018-09-21 NOTE — Progress Notes (Signed)
I was preceptor the day of this visit.   

## 2018-09-21 NOTE — Telephone Encounter (Signed)
Awesome, please help patient schedule telemedicine visit whenever is best for her.

## 2018-09-21 NOTE — Telephone Encounter (Signed)
I attempted to call the patient again to reschedule her surgery.  I left her another message asking her to return my call.

## 2018-09-21 NOTE — Progress Notes (Signed)
[facility] Telemedicine Visit  Patient consented to have visit conducted via telephone.  Encounter participants: Patient: Angela Walls  Provider: Oralia Manis  Others (if applicable): None  Chief Complaint: hair loss   HPI: Hair loss Patient calling in for concerns of hair loss.  States that she has seen her PCP for this before and had a cream, but ran out of refills.  Denies any itching to area.  Does have some hair loss.  Has used ketoconazole shampoo in the past but this did not help.  Cream that PCP prescribed is the only thing that helps.  Patient denies any pus or blood.  This looks exactly like it did when she saw PCP around October 2019.   ROS: see HPI, other ROS negative  Pertinent PMHx: hair loss, SLE  Assessment/Plan:  Hair loss Per chart review it appears PCP gave trial of clobetazole which seemed to help patient.  Given that patient improved with this will give refill of cream.  Patient is very appreciative.  Strict return precautions given.  Follow-up with PCP as needed.    Time spent on phone with patient: 15 minutes

## 2018-09-22 NOTE — Telephone Encounter (Signed)
Spoke with pt. She stated that she spoke with someone yesterday about her meds, and that they would send the meds over to her pharmacy. Aquilla Solian, CMA

## 2018-09-24 ENCOUNTER — Other Ambulatory Visit: Payer: Self-pay | Admitting: *Deleted

## 2018-09-24 DIAGNOSIS — G8929 Other chronic pain: Secondary | ICD-10-CM

## 2018-09-24 DIAGNOSIS — M25551 Pain in right hip: Principal | ICD-10-CM

## 2018-09-25 MED ORDER — HYDROCODONE-ACETAMINOPHEN 10-325 MG PO TABS: 1.0000 | ORAL_TABLET | Freq: Four times a day (QID) | ORAL | 0 refills | Status: DC | PRN

## 2018-09-25 NOTE — Telephone Encounter (Signed)
Patient called checking on the status of her prescription for hip pain. Please give patient a call back.

## 2018-09-28 ENCOUNTER — Other Ambulatory Visit: Payer: Medicaid Other

## 2018-09-30 ENCOUNTER — Other Ambulatory Visit: Payer: Self-pay

## 2018-09-30 ENCOUNTER — Ambulatory Visit (INDEPENDENT_AMBULATORY_CARE_PROVIDER_SITE_OTHER): Payer: Medicaid Other | Admitting: Student in an Organized Health Care Education/Training Program

## 2018-09-30 ENCOUNTER — Telehealth (INDEPENDENT_AMBULATORY_CARE_PROVIDER_SITE_OTHER): Payer: Medicaid Other | Admitting: Family Medicine

## 2018-09-30 ENCOUNTER — Telehealth: Payer: Self-pay

## 2018-09-30 VITALS — BP 120/68 | HR 75 | Temp 98.5°F | Wt 176.4 lb

## 2018-09-30 DIAGNOSIS — Z3009 Encounter for other general counseling and advice on contraception: Secondary | ICD-10-CM | POA: Diagnosis not present

## 2018-09-30 DIAGNOSIS — Z309 Encounter for contraceptive management, unspecified: Secondary | ICD-10-CM | POA: Diagnosis not present

## 2018-09-30 LAB — POCT URINE PREGNANCY: Preg Test, Ur: NEGATIVE

## 2018-09-30 MED ORDER — MEDROXYPROGESTERONE ACETATE 150 MG/ML IM SUSP
150.0000 mg | Freq: Once | INTRAMUSCULAR | Status: AC
  Administered 2018-09-30: 150 mg via INTRAMUSCULAR

## 2018-09-30 NOTE — Progress Notes (Signed)
depourine

## 2018-09-30 NOTE — Assessment & Plan Note (Signed)
Pregnancy test negative. Risks and benefits of depo provera discussed Common side effects discussed Alternative protection against STI recommended Advised patient to follow up in 3 months for next injection

## 2018-09-30 NOTE — Progress Notes (Signed)
   CC: wants depo  HPI: Angela Walls is a 38 y.o. female with PMH: Past Medical History:  Diagnosis Date  . Anemia   . 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 On chronic narcotics for this.  . Hypertension   . Lupus (HCC)    Followed by Rheum. On chronic Prednisone.   Patient reports she would like the depo provera shot. She reports she has received this injection in the past and tolerated it well. She reports she is not pregnant. She declines contraception counseling and reports she has already made her decision.  Review of Symptoms:  See HPI for ROS.   CC, SH/smoking status, and VS noted.  Objective: BP 120/68   Pulse 75   Temp 98.5 F (36.9 C) (Oral)   Wt 176 lb 6 oz (80 kg)   LMP 09/07/2018   SpO2 99%   BMI 29.35 kg/m  GEN: NAD, alert, cooperative, and pleasant. RESPIRATORY: Comfortable work of breathing, speaks in full sentences CV: Regular rate noted, distal extremities well perfused and warm without edema GI: Soft, nondistended SKIN: warm and dry, no rashes or lesions NEURO: II-XII grossly intact MSK: Moves 4 extremities equally PSYCH: AAOx3, appropriate affect  Assessment and plan:  Encounter for contraceptive management Pregnancy test negative. Risks and benefits of depo provera discussed Common side effects discussed Alternative protection against STI recommended Advised patient to follow up in 3 months for next injection   Orders Placed This Encounter  Procedures  . POCT urine pregnancy    Meds ordered this encounter  Medications  . medroxyPROGESTERone (DEPO-PROVERA) injection 150 mg    Howard Pouch, MD,MS,  PGY3 09/30/2018 3:18 PM

## 2018-09-30 NOTE — Progress Notes (Signed)
She requested a visit for Depo shot. Appointment made on ATC.

## 2018-09-30 NOTE — Telephone Encounter (Signed)
Called pt to remind her of her next depo shot. That is btw Jun 24- Jul 8. Pt understood. Aquilla Solian, CMA

## 2018-10-26 ENCOUNTER — Other Ambulatory Visit: Payer: Self-pay | Admitting: Family Medicine

## 2018-10-26 DIAGNOSIS — G8929 Other chronic pain: Secondary | ICD-10-CM

## 2018-10-26 DIAGNOSIS — M25551 Pain in right hip: Principal | ICD-10-CM

## 2018-10-26 MED ORDER — HYDROCODONE-ACETAMINOPHEN 10-325 MG PO TABS: 1.0000 | ORAL_TABLET | Freq: Four times a day (QID) | ORAL | 0 refills | Status: DC | PRN

## 2018-10-26 NOTE — Telephone Encounter (Signed)
Pt would like to have her Vicodin refilled.

## 2018-11-11 ENCOUNTER — Other Ambulatory Visit: Payer: Medicaid Other

## 2018-11-23 ENCOUNTER — Other Ambulatory Visit: Payer: Self-pay | Admitting: *Deleted

## 2018-11-23 DIAGNOSIS — M25551 Pain in right hip: Secondary | ICD-10-CM

## 2018-11-23 DIAGNOSIS — G8929 Other chronic pain: Secondary | ICD-10-CM

## 2018-11-25 MED ORDER — HYDROCODONE-ACETAMINOPHEN 10-325 MG PO TABS: 1.0000 | ORAL_TABLET | Freq: Four times a day (QID) | ORAL | 0 refills | Status: DC | PRN

## 2018-11-25 NOTE — Telephone Encounter (Signed)
Refill provided, pt needs appt to f/u for chronic pain. Please help pt schedule.

## 2018-11-30 ENCOUNTER — Ambulatory Visit: Payer: Medicaid Other | Admitting: Family Medicine

## 2018-12-07 ENCOUNTER — Ambulatory Visit: Payer: Medicaid Other | Admitting: Family Medicine

## 2018-12-07 ENCOUNTER — Other Ambulatory Visit: Payer: Self-pay

## 2018-12-07 ENCOUNTER — Encounter: Payer: Self-pay | Admitting: Family Medicine

## 2018-12-07 VITALS — BP 135/80 | HR 70 | Temp 98.6°F | Ht 65.0 in | Wt 172.0 lb

## 2018-12-07 DIAGNOSIS — Z23 Encounter for immunization: Secondary | ICD-10-CM

## 2018-12-07 DIAGNOSIS — M25551 Pain in right hip: Secondary | ICD-10-CM | POA: Diagnosis not present

## 2018-12-07 DIAGNOSIS — L659 Nonscarring hair loss, unspecified: Secondary | ICD-10-CM

## 2018-12-07 DIAGNOSIS — M329 Systemic lupus erythematosus, unspecified: Secondary | ICD-10-CM | POA: Diagnosis not present

## 2018-12-07 DIAGNOSIS — G8929 Other chronic pain: Secondary | ICD-10-CM

## 2018-12-07 NOTE — Assessment & Plan Note (Signed)
Referral placed to rheumatology for reestablishment.  Will obtain BMP today.

## 2018-12-07 NOTE — Patient Instructions (Signed)
It was great to see you!  Our plans for today:  - We are checking some labs today, we will call you or send you a letter with these results. - continue taking the steroid cream and biotin for your hair loss. - We are referring you to physical therapy for your hip pain. - We are referring you to Rheumatology for your lupus. - Review the birth control handout. If you are sure you are done having kids, think about getting your tubes tied.  Take care and seek immediate care sooner if you develop any concerns.   Dr. [facility]

## 2018-12-07 NOTE — Assessment & Plan Note (Signed)
Noticing some effect with biotin and steroid cream.  Will obtain TSH although may be falsely elevated due to biotin, will check anyway.  Could also be related to recent restart in depo though and may take a few months to level out.

## 2018-12-07 NOTE — Assessment & Plan Note (Signed)
Referral placed to PT.  Would like to try this before orthopedic referral.  As previously discussed in prior visits, will need to work at tapering narcotics once she has established with physical therapy.  Ultimately, she would likely need hip replacement for definitive treatment given her avascular necrosis but patient has historically been very hesitant.

## 2018-12-07 NOTE — Progress Notes (Signed)
Subjective:   Patient ID: Angela Walls    DOB: 12/16/1980, 38 y.o. female   MRN: [id number]  Angela Modeimisha D Dohse is a 38 y.o. female with a history of HTN, avascular necrosis of R femoral head, SLE here for   Chronic pain Indication for chronic opioid: avascular necrosis of R femoral head Medication and dose: Norco 10-325mg  # pills per month: 120 Last UDS date: 05/2018 Pain contract signed (Y/N): Y Date narcotic database last reviewed (include red flags): 12/07/18 - previously discussed PT, has not heard about appointment. Wants to try PT before orthopedic surgery referral.  - Currently going through [employer]. for shoulder pain  Lupus - Previously seen by rheumatology but has been many years.  Previously on methotrexate but has taken herself off all medications.  Contraception Having heavier periods on depo, had been off for a while. Previously on OCPs, got pregnant on pills. Wants to review other options but would like to stay on depo if she can.  Hair loss - noticed at her crown of scalp - Wears hair different ways, doesn't think she places too much traction - No discharge, bleeding, or rash - Not itching.  - Takes biotin for the past month, noticing a little difference. Steroid cream helping a little also.  Review of Systems:  Per HPI.  PMFSH, medications and smoking status reviewed.  Objective:   BP 135/80   Pulse 70   Temp 98.6 F (37 C) (Oral)   Ht 5\' 5"  (1.651 m)   Wt 172 lb (78 kg)   LMP 11/27/2018   SpO2 99%   BMI 28.62 kg/m  Vitals and nursing note reviewed.  General: well nourished, well developed, in no acute distress with non-toxic appearance HEENT: normocephalic, atraumatic, moist mucous membranes. Thinning of hair noted at crown, unable to fully assess the rest of scalp due to extensions in place. CV: regular rate and rhythm without murmurs, rubs, or gallops Lungs: clear to auscultation bilaterally with normal work of breathing Skin: warm, dry, no  rashes or lesions Extremities: warm and well perfused, normal tone MSK: ROM grossly intact, strength intact, gait normal Neuro: Alert and oriented, speech normal  Assessment & Plan:   SLE Referral placed to rheumatology for reestablishment.  Will obtain BMP today.  Hair loss Noticing some effect with biotin and steroid cream.  Will obtain TSH although may be falsely elevated due to biotin, will check anyway.  Could also be related to recent restart in depo though and may take a few months to level out.  Chronic right hip pain Referral placed to PT.  Would like to try this before orthopedic referral.  As previously discussed in prior visits, will need to work at tapering narcotics once she has established with physical therapy.  Ultimately, she would likely need hip replacement for definitive treatment given her avascular necrosis but patient has historically been very hesitant.  Contraception Reviewed different options, handout provided.  Patient would like to stay on depo if she can.   Orders Placed This Encounter  Procedures  . Tdap vaccine greater than or equal to 7yo IM  . TSH  . Basic Metabolic Panel  . Ambulatory referral to Physical Therapy    Referral Priority:   Routine    Referral Type:   Physical Medicine    Referral Reason:   Specialty Services Required    Requested Specialty:   Physical Therapy    Number of Visits Requested:   1  . Ambulatory referral to Rheumatology  Referral Priority:   Routine    Referral Type:   Consultation    Referral Reason:   Specialty Services Required    Requested Specialty:   Rheumatology    Number of Visits Requested:   1   No orders of the defined types were placed in this encounter.   Rory Percy, DO PGY-2, [facility] 12/07/2018 4:54 PM

## 2018-12-08 LAB — BASIC METABOLIC PANEL
BUN/Creatinine Ratio: 14 (ref 9–23)
BUN: 13 mg/dL (ref 6–20)
CO2: 20 mmol/L (ref 20–29)
Calcium: 9.2 mg/dL (ref 8.7–10.2)
Chloride: 105 mmol/L (ref 96–106)
Creatinine, Ser: 0.91 mg/dL (ref 0.57–1.00)
GFR calc Af Amer: 94 mL/min/{1.73_m2} (ref >59)
GFR calc non Af Amer: 81 mL/min/{1.73_m2} (ref >59)
Glucose: 88 mg/dL (ref 65–99)
Potassium: 4.1 mmol/L (ref 3.5–5.2)
Sodium: 138 mmol/L (ref 134–144)

## 2018-12-08 LAB — TSH: TSH: 1.04 u[IU]/mL (ref 0.450–4.500)

## 2018-12-14 ENCOUNTER — Other Ambulatory Visit: Payer: Self-pay | Admitting: Family Medicine

## 2018-12-14 DIAGNOSIS — M329 Systemic lupus erythematosus, unspecified: Secondary | ICD-10-CM

## 2018-12-24 ENCOUNTER — Other Ambulatory Visit: Payer: Self-pay | Admitting: *Deleted

## 2018-12-24 DIAGNOSIS — M25551 Pain in right hip: Secondary | ICD-10-CM

## 2018-12-24 DIAGNOSIS — G8929 Other chronic pain: Secondary | ICD-10-CM

## 2018-12-24 MED ORDER — HYDROCODONE-ACETAMINOPHEN 10-325 MG PO TABS: 1.0000 | ORAL_TABLET | Freq: Four times a day (QID) | ORAL | 0 refills | Status: DC | PRN

## 2019-01-13 ENCOUNTER — Ambulatory Visit: Payer: Medicaid Other | Admitting: Family Medicine

## 2019-01-18 ENCOUNTER — Ambulatory Visit: Payer: Medicaid Other | Admitting: Family Medicine

## 2019-01-18 ENCOUNTER — Encounter: Payer: Self-pay | Admitting: Family Medicine

## 2019-01-18 ENCOUNTER — Other Ambulatory Visit: Payer: Self-pay

## 2019-01-18 VITALS — BP 132/74 | HR 80

## 2019-01-18 DIAGNOSIS — Z309 Encounter for contraceptive management, unspecified: Secondary | ICD-10-CM | POA: Diagnosis not present

## 2019-01-18 DIAGNOSIS — L509 Urticaria, unspecified: Secondary | ICD-10-CM | POA: Diagnosis not present

## 2019-01-18 LAB — POCT URINE PREGNANCY: Preg Test, Ur: NEGATIVE

## 2019-01-18 MED ORDER — FAMOTIDINE 20 MG PO TABS: 20.0000 mg | ORAL_TABLET | Freq: Two times a day (BID) | ORAL | 0 refills | Status: DC

## 2019-01-18 MED ORDER — MEDROXYPROGESTERONE ACETATE 150 MG/ML IM SUSY
150.0000 mg | PREFILLED_SYRINGE | INTRAMUSCULAR | Status: AC
  Administered 2019-01-18: 15:00:00 150 mg via INTRAMUSCULAR

## 2019-01-18 MED ORDER — CETIRIZINE HCL 10 MG PO TABS: 10.0000 mg | ORAL_TABLET | Freq: Every day | ORAL | 0 refills | Status: DC

## 2019-01-18 NOTE — Progress Notes (Signed)
Subjective:   Patient ID: Angela Walls    DOB: 10/06/1980, 38 y.o. female   MRN: [id number]  Angela Walls is a 38 y.o. female with a history of HTN, avascular necrosis of R femoral head, DUB, SLE here for   RASH  Had rash for the past week. States she ate at a [place] restaurant last week and noticed the rash later that day.  States she has had [place] food before but was at a different place and is unsure if this particular restaurant use different ingredients. Location: Bilateral arms, neck, chest, leg, behind her ears Medications tried: Benadryl which helped with sleeping, Benadryl cream Similar rash in past: yes when she ate chickfila sauce Patient believes may be caused by food allergy Does state it seems that the rash is getting better, was more blotchy previously, however states the itching is persistent. No one else at home has a similar rash.  New medications or antibiotics: no Tick, Insect or new pet exposure: no Recent travel: no New detergent or soap: yes, noted to have changed soaps over a week ago but this was after her rash. Immunocompromised: Yes, has lupus No one else at home has a similar rash  Symptoms Itching: yes Pain over rash: no Feeling ill all over: no Fever: no Mouth sores: no Face or tongue swelling: no Trouble breathing: no Joint swelling or pain: no  Review of Systems:  Per HPI.  PMFSH, medications and smoking status reviewed.  Objective:   BP 132/74   Pulse 80  Vitals and nursing note reviewed.  General: well nourished, well developed, in no acute distress with non-toxic appearance HEENT: normocephalic, atraumatic, moist mucous membranes, no oral or mucosal lesions. Neck: supple, non-tender without lymphadenopathy CV: regular rate and rhythm without murmurs, rubs, or gallops Lungs: clear to auscultation bilaterally with normal work of breathing Skin: warm, dry.  Diffuse erythematous pruritic wheal-like patches over bilateral arms, upper  chest and neck, and behind ears. Extremities: warm and well perfused, normal tone MSK: ROM grossly intact, strength intact, gait normal Neuro: Alert and oriented, speech normal  Assessment & Plan:   Hives Noted after eating [place] food last week, no other known exposure trigger.  Rash appears to be improving per patient with persistent pruritus.  No respiratory involvement nor mucosal or oral lesions.  Given she has had prior allergic reactions to food in the past, would recommend referral to allergist for more extensive allergy testing.  Patient has an EpiPen at home and knows to carry this with her at all times.  Advised to continue Benadryl as needed at night as well as famotidine and Zyrtec during the day for symptomatic relief.  Advised to call if this is not providing relief and we can consider oral steroids at that point.  Orders Placed This Encounter  Procedures  . Ambulatory referral to Allergy    Referral Priority:   Routine    Referral Type:   Allergy Testing    Referral Reason:   Specialty Services Required    Requested Specialty:   Allergy    Number of Visits Requested:   1  . POCT urine pregnancy   Meds ordered this encounter  Medications  . medroxyPROGESTERone Acetate SUSY 150 mg  . famotidine (PEPCID) 20 MG tablet    Sig: Take 1 tablet (20 mg total) by mouth 2 (two) times daily for 7 days.    Dispense:  14 tablet    Refill:  0  . cetirizine (ZYRTEC) 10  MG tablet    Sig: Take 1 tablet (10 mg total) by mouth daily for 7 days.    Dispense:  7 tablet    Refill:  0  . EPINEPHrine 0.3 mg/0.3 mL IJ SOAJ injection    Sig: Inject 0.3 mLs (0.3 mg total) into the muscle as needed for anaphylaxis.    Dispense:  1 each    Refill:  0    Rory Percy, DO PGY-3, [facility] 01/19/2019 11:20 AM

## 2019-01-18 NOTE — Patient Instructions (Signed)
It was great to see you!  Our plans for today:  - Take the zyrtec and famotidine daily for 7 days.  Continue to take Benadryl at night for itching. -If your itching is not controlled with these medications as above, give [place] a call back and we can consider steroids at that time. -We are placing a referral to an allergist for more extensive testing. -Keep your EpiPen with you at all times.  Take care and seek immediate care sooner if you develop any concerns.   Dr. [facility]

## 2019-01-19 ENCOUNTER — Telehealth: Payer: Self-pay | Admitting: *Deleted

## 2019-01-19 ENCOUNTER — Other Ambulatory Visit: Payer: Self-pay | Admitting: Family Medicine

## 2019-01-19 ENCOUNTER — Encounter: Payer: Self-pay | Admitting: Family Medicine

## 2019-01-19 DIAGNOSIS — L509 Urticaria, unspecified: Secondary | ICD-10-CM | POA: Insufficient documentation

## 2019-01-19 MED ORDER — CIMETIDINE 200 MG PO TABS: 200.0000 mg | ORAL_TABLET | Freq: Every day | ORAL | 0 refills | Status: DC

## 2019-01-19 MED ORDER — EPINEPHRINE 0.3 MG/0.3ML IJ SOAJ: 0.3000 mg | INTRAMUSCULAR | 0 refills | Status: AC | PRN

## 2019-01-19 NOTE — Telephone Encounter (Signed)
Pt calls.  Famotidine is on backorder.    Spoke with MD, she will call in a different antihistamine this afternoon.   Pt informed and appreciative.  Christen Bame, CMA

## 2019-01-19 NOTE — Telephone Encounter (Signed)
Done. [facility] to [facility] [address].

## 2019-01-19 NOTE — Assessment & Plan Note (Signed)
Noted after eating [place] food last week, no other known exposure trigger.  Rash appears to be improving per patient with persistent pruritus.  No respiratory involvement nor mucosal or oral lesions.  Given she has had prior allergic reactions to food in the past, would recommend referral to allergist for more extensive allergy testing.  Patient has an EpiPen at home and knows to carry this with her at all times.  Advised to continue Benadryl as needed at night as well as famotidine and Zyrtec during the day for symptomatic relief.  Advised to call if this is not providing relief and we can consider oral steroids at that point.

## 2019-01-20 ENCOUNTER — Telehealth: Payer: Self-pay

## 2019-01-20 NOTE — Telephone Encounter (Signed)
[facility] stating Cimetidine is not covered by medicaid.

## 2019-01-20 NOTE — Telephone Encounter (Signed)
Prior approval for cimetidine completed via [place] Tracks.  Med approved for 01/20/2019 - 02/19/2019.  Pharmacy and patient informed.  Christen Bame, CMA

## 2019-01-22 ENCOUNTER — Other Ambulatory Visit: Payer: Self-pay | Admitting: *Deleted

## 2019-01-22 DIAGNOSIS — M25551 Pain in right hip: Secondary | ICD-10-CM

## 2019-01-22 DIAGNOSIS — G8929 Other chronic pain: Secondary | ICD-10-CM

## 2019-01-23 MED ORDER — HYDROCODONE-ACETAMINOPHEN 10-325 MG PO TABS: 1.0000 | ORAL_TABLET | Freq: Four times a day (QID) | ORAL | 0 refills | Status: DC | PRN

## 2019-01-23 NOTE — Telephone Encounter (Signed)
Rx refill sent. Please remind patient she needs to schedule appointment for physical therapy.

## 2019-01-25 ENCOUNTER — Telehealth: Payer: Self-pay | Admitting: *Deleted

## 2019-01-25 NOTE — Telephone Encounter (Signed)
Prior approval for hydrocodone completed via  Tracks.  Med approved for 01/25/2019 - 07/24/2019.  [facility] informed and left message on VM informing patient.  Christen Bame, CMA

## 2019-01-25 NOTE — Telephone Encounter (Signed)
Completed PA info in [place] Tracks for hydrocodone.  Status pending.  Will recheck status in 24 hours. Enis Leatherwood, CMA    

## 2019-01-25 NOTE — Telephone Encounter (Signed)
Patient states that the cream for her head does not help.  She wants to know if there is something else she can use. Christen Bame, CMA

## 2019-01-26 NOTE — Telephone Encounter (Signed)
Attempted to call pt. LVM to call office to set up appt to discuss steroid cream and growth/loss of hiar. Angela Walls, CMA

## 2019-01-26 NOTE — Telephone Encounter (Signed)
It's been over a month since she was seen for this. At that time, reported steroid cream to be helping and also couldn't fully assess hair growth/loss due to hair extensions in place.   If the cream is not helping, she should come back to be seen.

## 2019-02-11 ENCOUNTER — Ambulatory Visit: Payer: Medicaid Other | Admitting: Allergy

## 2019-02-12 ENCOUNTER — Telehealth: Payer: Medicaid Other | Admitting: Family Medicine

## 2019-02-12 ENCOUNTER — Other Ambulatory Visit: Payer: Self-pay

## 2019-02-12 NOTE — Progress Notes (Signed)
Attempted to reach patient multiple times but no answer, LVM to call back but did not return call prior to the end of the day. No charge for visit.

## 2019-02-25 ENCOUNTER — Telehealth (INDEPENDENT_AMBULATORY_CARE_PROVIDER_SITE_OTHER): Payer: Medicaid Other | Admitting: Family Medicine

## 2019-02-25 ENCOUNTER — Other Ambulatory Visit: Payer: Self-pay

## 2019-02-25 DIAGNOSIS — L659 Nonscarring hair loss, unspecified: Secondary | ICD-10-CM

## 2019-02-25 NOTE — Progress Notes (Signed)
[facility] Telemedicine Visit  Patient consented to have virtual visit. Method of visit: Telephone  Encounter participants: Patient: Angela Walls - located at home Provider: Rory Percy - located at [facility] Others (if applicable): n/a  Chief Complaint: hair loss, flaking of scalp  HPI:  Previously seen for hair loss via telemedicine as well as in person appointment.  Unable to visualize scalp each time due to presence of weave or wig.  Endorses this has been ongoing at least for a few months, and was initially alerted by her hairstylist.  Reports hair loss is along the middle part line.  Hair does not break off in the middle of the shaft.  She has not noticed hair coming out in clumps.  She also notes her scalp is sensitive in this area and is sometimes red.  She noticed extensive dryness and flaking in this area, states it looked like cradle cap and was painful.  Denies bleeding.  States it is not normally painful.  Previously obtained BMP and TSH, within normal limits.  Has previously tried steroid cream and OTC dandruff as well as ketoconazole shampoo without much relief.  Has also tried biotin for about 2-3 months and did not notice any difference with this either.  Does have SLE, currently not on any treatment.  Has been referred to rheumatology for reestablishment, most recently 11/2018, but patient states she has not received a call to make an appointment.  ROS: per HPI  Pertinent PMHx: HTN, avascular necrosis of right femoral head, SLE, left breast fibroadenoma  Exam:  Respiratory: Speaks in full sentences, no respiratory distress   Assessment/Plan:  Hair loss Difficult to fully assess over the phone.  Has had prior lab work-up including TSH and BMP, within normal limits.  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  Could consider minoxidil,  however patient is experiencing additional symptoms not classic for female pattern hair loss including pain, redness and flaking of skin.  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  Advised patient to make in person appointment to more fully evaluate without wig or weave in place.  Also has SLE and is not currently on medication for this so could also be contributing.  Advised patient to call rheumatology office to set up appointment for reestablishment.  Treatment depends upon physical exam.    Time spent during visit with patient: 15 minutes

## 2019-02-25 NOTE — Assessment & Plan Note (Signed)
Difficult to fully assess over the phone.  Has had prior lab work-up including TSH and BMP, within normal limits.  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  Could consider minoxidil, however patient is experiencing additional symptoms not classic for female pattern hair loss including pain, redness and flaking of skin.  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  Advised patient to make in person appointment to more fully evaluate without wig or weave in place.  Also has SLE and is not currently on medication for this so could also be contributing.  Advised patient to call rheumatology office to set up appointment for reestablishment.  Treatment depends upon physical exam.

## 2019-02-25 NOTE — Progress Notes (Signed)
Attempted to call pt at 2:50. No answer. LVM of reminder of telephone visit at 3:30. Salvatore Marvel, CMA

## 2019-02-26 ENCOUNTER — Telehealth: Payer: Self-pay | Admitting: Family Medicine

## 2019-02-26 ENCOUNTER — Other Ambulatory Visit: Payer: Self-pay

## 2019-02-26 DIAGNOSIS — G8929 Other chronic pain: Secondary | ICD-10-CM

## 2019-02-26 DIAGNOSIS — M25551 Pain in right hip: Secondary | ICD-10-CM

## 2019-02-26 NOTE — Telephone Encounter (Signed)
Patient calling again to check the status 

## 2019-02-26 NOTE — Telephone Encounter (Signed)
**  After Hours/ Emergency Line Call**  Received a call to report that Angela Walls has not heard about the refill of her pain medication and she is worried because the office does not open until Tuesday.  Patient reports that she did not know this was an emergency line and she just did not know who to contact in order to check to be sure that her pain medication would be filled before Tuesday.  Informed patient that I would not be able to refill her chronic pain medication but that I would forward this to her PCP, however given that it is the weekend her PCP may not check her inbox until regular business hours on Tuesday.  Patient voiced understanding.  Will forward to PCP.  [place] Lynell Greenhouse, DO PGY-3, [facility] 02/26/2019 9:37 PM

## 2019-02-26 NOTE — Telephone Encounter (Signed)
**  After Hours/ Emergency Line Call**  Received a call to report that Angela Walls needed a return call to the emergency line, however called patient at provided number [phone] without answer.    [place] Charlene Cowdrey, DO PGY-3, [facility] 02/26/2019 8:16 PM

## 2019-02-27 ENCOUNTER — Other Ambulatory Visit: Payer: Self-pay | Admitting: Family Medicine

## 2019-02-27 MED ORDER — HYDROCODONE-ACETAMINOPHEN 10-325 MG PO TABS: 1.0000 | ORAL_TABLET | Freq: Four times a day (QID) | ORAL | 0 refills | Status: DC | PRN

## 2019-02-27 NOTE — Telephone Encounter (Signed)
[facility] After Hours Emergency Line Note  Received a call from Ms. Pendelton this morning.  She reports that she called for refill on her Norco on 9/3 and then called again on 9/4 to check on this refill.  She also called our emergency line after hours on 9/4 and was told that chronic pain medication is not refilled through our emergency line.  She says that normally she could wait through the weekend, but since our clinic is closed until Tuesday, she feels like she needs her pain medication before then.  She says that she also called 1 month ago and never got the pain medication refilled and was told that the message did not get sent successfully to her doctor.  I told her that we normally do not refill medication on our after hours emergency line, but since she has called on Thursday, 9/3 and Friday, 9/4 during our business hours, I will refill her medication this time.  We discussed that she needs to ask for her pain medication early to avoid this problem in the future, and she expressed understanding.  The PDMP was reviewed, and patient is receiving this medication only from our office and receives it at the start of about every month, so she is due for a refill.  Patient's Norco 120 tablets were sent to her pharmacy.  We will route note to PCP as an FYI.  Amnah Breuer C. Shan Levans, MD PGY-3, [facility] 02/27/2019 10:43 AM

## 2019-03-04 ENCOUNTER — Ambulatory Visit: Payer: Medicaid Other | Admitting: Allergy

## 2019-03-23 ENCOUNTER — Other Ambulatory Visit: Payer: Self-pay | Admitting: *Deleted

## 2019-03-23 DIAGNOSIS — M25551 Pain in right hip: Secondary | ICD-10-CM

## 2019-03-23 DIAGNOSIS — G8929 Other chronic pain: Secondary | ICD-10-CM

## 2019-03-23 MED ORDER — HYDROCODONE-ACETAMINOPHEN 10-325 MG PO TABS: 1.0000 | ORAL_TABLET | Freq: Four times a day (QID) | ORAL | 0 refills | Status: DC | PRN

## 2019-03-23 NOTE — Telephone Encounter (Signed)
Pt lm on nurse line.  I was unable to speak with her.  She is available for callback. Christen Bame, CMA

## 2019-03-23 NOTE — Telephone Encounter (Signed)
Pt states that she is not out but wants to request early so she does not run out like she did last month. Christen Bame, CMA

## 2019-03-23 NOTE — Telephone Encounter (Signed)
PMP reviewed and patient is a week early. Will send refill with instructions to pharmacy not to fill prior to 10/5. She should also make an appointment to follow up for her chronic pain as it has been a few months since her last appointment for this. Thanks!

## 2019-03-23 NOTE — Telephone Encounter (Signed)
LM for patient to call back.  Will inform her of refill and need for an appt then.  Hal Norrington,CMA

## 2019-03-23 NOTE — Telephone Encounter (Signed)
Patient aware that she needs an appt and will call back to schedule this.  Isebella Upshur,CMA

## 2019-04-12 ENCOUNTER — Ambulatory Visit: Payer: Medicaid Other

## 2019-04-14 ENCOUNTER — Other Ambulatory Visit: Payer: Self-pay

## 2019-04-14 ENCOUNTER — Ambulatory Visit (INDEPENDENT_AMBULATORY_CARE_PROVIDER_SITE_OTHER): Payer: Medicaid Other

## 2019-04-14 DIAGNOSIS — Z3042 Encounter for surveillance of injectable contraceptive: Secondary | ICD-10-CM

## 2019-04-14 MED ORDER — MEDROXYPROGESTERONE ACETATE 150 MG/ML IM SUSP
150.0000 mg | Freq: Once | INTRAMUSCULAR | Status: AC
  Administered 2019-04-14: 150 mg via INTRAMUSCULAR

## 2019-04-14 NOTE — Progress Notes (Signed)
Patient presents in nurse clinic for depo provera injection. Patient is within dates. Injection given RUOQ, site unremarkable. Patient is due back for next injection, 06/30/2019-07/14/2019, reminder card given.

## 2019-04-26 ENCOUNTER — Other Ambulatory Visit: Payer: Self-pay | Admitting: *Deleted

## 2019-04-26 DIAGNOSIS — G8929 Other chronic pain: Secondary | ICD-10-CM

## 2019-04-26 MED ORDER — HYDROCODONE-ACETAMINOPHEN 10-325 MG PO TABS: 1.0000 | ORAL_TABLET | Freq: Four times a day (QID) | ORAL | 0 refills | Status: DC | PRN

## 2019-04-26 NOTE — Telephone Encounter (Signed)
Please help patient schedule appointment for chronic pain. Refill sent.

## 2019-05-10 ENCOUNTER — Encounter: Payer: Self-pay | Admitting: Family Medicine

## 2019-05-10 ENCOUNTER — Ambulatory Visit (INDEPENDENT_AMBULATORY_CARE_PROVIDER_SITE_OTHER): Payer: Medicaid Other | Admitting: Family Medicine

## 2019-05-10 ENCOUNTER — Other Ambulatory Visit: Payer: Self-pay

## 2019-05-10 VITALS — BP 128/78 | HR 73 | Wt 183.0 lb

## 2019-05-10 DIAGNOSIS — M87051 Idiopathic aseptic necrosis of right femur: Secondary | ICD-10-CM

## 2019-05-10 DIAGNOSIS — M329 Systemic lupus erythematosus, unspecified: Secondary | ICD-10-CM

## 2019-05-10 DIAGNOSIS — L659 Nonscarring hair loss, unspecified: Secondary | ICD-10-CM | POA: Diagnosis not present

## 2019-05-10 MED ORDER — MINOXIDIL 5 % EX SOLN: 1.0000 mL | Freq: Two times a day (BID) | CUTANEOUS | 0 refills | Status: DC

## 2019-05-10 NOTE — Progress Notes (Signed)
Subjective:   Patient ID: Angela Walls    DOB: 1980/09/22, 38 y.o. female   MRN: [id number]  Angela Walls is a 39 y.o. female with a history of HTN, SLE, avascular necrosis of R femoral head with chronic hip pain here for   Hair loss - Previously seen multiple times for same, last appointment virtual 02/25/2019.  At that time had been describing hair loss along the middle part line with associated dryness, pain and flaking in this area.  Denied bleeding.  Denied hair breaking off in the middle of the shaft or coming out in clumps.  Previously obtained BMP and TSH within normal limits.  Previously tried steroid cream, ketoconazole shampoo, biotin. - has been going on for about 6 months - h/o SLE, per chart review refused referral to rheumatology.  Currently not on medication. - stylist recommended pill, doesn't remember what the pill was called.  - Today, reports symptoms haven't really changed, still itching, dryness.  - in the process of moving. Financial stress with work, currently unemployed. - currently with hair extensions   Chronic Hip Pain Indication for chronic opioid:  Avascular necrosis of R femoral head Medication and dose:  Norco 10-325mg  # pills per month:  120 Last UDS date:  05/2018 Pain contract signed (Y/N): Y Date narcotic database last reviewed (include red flags): 05/10/2019 - On previous discussion, wanted to try physical therapy prior to being referred to orthopedic surgery, however patient has not been cue to [place] pandemic. - seeing chiropractor.  - has previously seen Dr. Lynann Bologna, interested in seeing her again.   Review of Systems:  Per HPI.  Medications and smoking status reviewed.  Objective:   BP 128/78   Pulse 73   Wt 183 lb (83 kg)   SpO2 98%   BMI 30.45 kg/m  Vitals and nursing note reviewed.  General: overweight female, in no acute distress with non-toxic appearance HEENT: normocephalic, atraumatic, moist mucous membranes. Thinning of hair  noted to middle part line. Negative hair pull test.  Skin: warm, dry, no rashes or lesions  Assessment & Plan:   Avascular necrosis of femur head, right (HCC) Pain controlled on current regimen, PDMP reviewed today. Discussed ultimate treatment would be a hip replacement. Referred to orthopedic surgery for consultation.  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 Will trial minoxidil. Also recommended reestablishing with Rheumatology as SLE could also contribute, referral placed. F/u in 3 months.  SLE Not currently on medication or followed by Rheum. Patient amenable to referral, placed today.  Orders Placed This Encounter  Procedures  . Ambulatory referral to Orthopedic Surgery    Referral Priority:   Routine    Referral Type:   Surgical    Referral Reason:   Specialty Services Required    Requested Specialty:   Orthopedic Surgery    Number of Visits Requested:   1  . Ambulatory referral to Rheumatology    Referral Priority:   Routine    Referral Type:   Consultation    Referral Reason:   Specialty Services Required    Requested Specialty:   Rheumatology    Number of Visits Requested:   1   Meds ordered this encounter  Medications  . MINOXIDIL, TOPICAL, 5 % SOLN    Sig: Apply 1 mL topically 2 (two) times daily.    Dispense:  120 mL    Refill:  0    Angela Percy, DO PGY-3, [facility] 05/10/2019 6:52 PM

## 2019-05-10 NOTE — Assessment & Plan Note (Addendum)
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 Will trial minoxidil. Also recommended reestablishing with Rheumatology as SLE could also contribute, referral placed. F/u in 3 months.

## 2019-05-10 NOTE — Patient Instructions (Signed)
It was great to see you!  Our plans for today:  - We are trying minoxidil solution for your hair loss. It can take a few months before seeing effect. - We referred you to Dr. Lynann Bologna to talk about treatment for your hip. - We referred you to Dr. Ouida Sills to talk about treatment for your lupus. - Come back in about 3 months to see how you are doing.  Take care and seek immediate care sooner if you develop any concerns.   Dr. [facility]

## 2019-05-10 NOTE — Assessment & Plan Note (Addendum)
Pain controlled on current regimen, PDMP reviewed today. Discussed ultimate treatment would be a hip replacement. Referred to orthopedic surgery for consultation.

## 2019-05-10 NOTE — Assessment & Plan Note (Signed)
Not currently on medication or followed by Rheum. Patient amenable to referral, placed today.

## 2019-05-27 ENCOUNTER — Other Ambulatory Visit: Payer: Self-pay | Admitting: *Deleted

## 2019-05-27 DIAGNOSIS — M25551 Pain in right hip: Secondary | ICD-10-CM

## 2019-05-27 DIAGNOSIS — G8929 Other chronic pain: Secondary | ICD-10-CM

## 2019-05-27 MED ORDER — HYDROCODONE-ACETAMINOPHEN 10-325 MG PO TABS: 1.0000 | ORAL_TABLET | Freq: Four times a day (QID) | ORAL | 0 refills | Status: DC | PRN

## 2019-06-28 ENCOUNTER — Other Ambulatory Visit: Payer: Self-pay

## 2019-06-28 DIAGNOSIS — G8929 Other chronic pain: Secondary | ICD-10-CM

## 2019-06-28 MED ORDER — HYDROCODONE-ACETAMINOPHEN 10-325 MG PO TABS: 1.0000 | ORAL_TABLET | Freq: Four times a day (QID) | ORAL | 0 refills | Status: DC | PRN

## 2019-07-15 ENCOUNTER — Telehealth: Payer: Self-pay | Admitting: *Deleted

## 2019-07-15 NOTE — Telephone Encounter (Signed)
She can see who she likes, I don't have a preference. If she does research and prefers a different practice, have her let us know so we can change the referral if we need to.

## 2019-07-15 NOTE — Telephone Encounter (Signed)
Spoke with pt. She informed me that the orthopedic doctor called and they set her up for any appt for Monday. Aquilla Solian, CMA

## 2019-07-15 NOTE — Telephone Encounter (Signed)
Pt states that Lunette Stands is no longer at [facility].  She wants to know who else Dr. Linwood Dibbles would recommend. [name], CMA

## 2019-07-19 DIAGNOSIS — M25551 Pain in right hip: Secondary | ICD-10-CM | POA: Diagnosis not present

## 2019-07-23 DIAGNOSIS — M25551 Pain in right hip: Secondary | ICD-10-CM | POA: Diagnosis not present

## 2019-07-26 ENCOUNTER — Other Ambulatory Visit: Payer: Self-pay

## 2019-07-26 DIAGNOSIS — M25551 Pain in right hip: Secondary | ICD-10-CM

## 2019-07-26 DIAGNOSIS — G8929 Other chronic pain: Secondary | ICD-10-CM

## 2019-07-27 ENCOUNTER — Telehealth: Payer: Self-pay | Admitting: Family Medicine

## 2019-07-27 MED ORDER — HYDROCODONE-ACETAMINOPHEN 10-325 MG PO TABS: 1.0000 | ORAL_TABLET | Freq: Four times a day (QID) | ORAL | 0 refills | Status: DC | PRN

## 2019-07-27 NOTE — Telephone Encounter (Signed)
Will forward to RN team to see if they have seen this request.  Burnard Hawthorne

## 2019-07-27 NOTE — Telephone Encounter (Signed)
Patient says her pharmacy needs a prior authorization in order for her to get her pain medication. Please let patient know when this has been done.

## 2019-07-27 NOTE — Telephone Encounter (Signed)
Completed PA info in [facility] Tracks for Hydrocodone.  Status pending. Will recheck status in 24 hours.

## 2019-07-29 NOTE — Telephone Encounter (Signed)
Prior approval for hydrocodone completed via [place] Tracks.  Med approved for 07/29/2019 - 01/25/2020.  [facility] and [facility] informed informed.  [name], CMA

## 2019-07-29 NOTE — Telephone Encounter (Signed)
Medication was denied, because an OVN needed to be attached.  Resubmitted with attached document. Pt is aware and I advised I would call her this afternoon.   [name], CMA

## 2019-08-24 ENCOUNTER — Other Ambulatory Visit: Payer: Self-pay

## 2019-08-24 DIAGNOSIS — G8929 Other chronic pain: Secondary | ICD-10-CM

## 2019-08-24 MED ORDER — HYDROCODONE-ACETAMINOPHEN 10-325 MG PO TABS: 1.0000 | ORAL_TABLET | Freq: Four times a day (QID) | ORAL | 0 refills | Status: DC | PRN

## 2019-08-24 NOTE — Telephone Encounter (Signed)
PMP Aware reviewed and without red flags. Has surgery scheduled for [facility] for avascular necrosis.

## 2019-09-14 ENCOUNTER — Telehealth: Payer: Self-pay | Admitting: *Deleted

## 2019-09-14 NOTE — Telephone Encounter (Signed)
Pt calls back for 2 reasons:  1. Wants to know if Dr. Linwood Dibbles has heard anything from Ortho  2. Wants to know if she can be referred to [facility] to discuss the possibility of a bone graft vs hip replacement.  To PCP. [name], CMA

## 2019-09-15 NOTE — Telephone Encounter (Signed)
I signed her surgical clearance form about a month ago and faxed back to the Ortho office. I haven't heard from them since. She should try contacting the ortho office first to see the status of her surgery.

## 2019-09-15 NOTE — Telephone Encounter (Signed)
Spoke with pt. She stated that she actually wanted to know if she needed a referral or can she just make an appointment?  To go to [facility]. Cause her mom did some research on hip replacements. And she found a doctor there that states given her age she may not have to do a total replacement, and that there are other options. Aquilla Solian, CMA

## 2019-09-15 NOTE — Telephone Encounter (Signed)
She will likely need a referral for her insurance. I'll put one in specifying [facility]. Does she have a particular provider she wants to see?

## 2019-09-15 NOTE — Telephone Encounter (Signed)
Pt states that she is going to call the office with  the information of which doctor she would like see at [facility]. Aquilla Solian, CMA

## 2019-09-17 ENCOUNTER — Other Ambulatory Visit: Payer: Self-pay | Admitting: Family Medicine

## 2019-09-17 DIAGNOSIS — M87051 Idiopathic aseptic necrosis of right femur: Secondary | ICD-10-CM

## 2019-09-17 NOTE — Telephone Encounter (Signed)
Patient LVM on nurse line stating she would like to see Dr. Tasia Catchings. His office contact [phone].

## 2019-09-17 NOTE — Telephone Encounter (Signed)
I am happy to refer her to this office. It appears Dr. Peggye Pitt is a hand specialist though so she may end up seeing a different provider that specializes in hips. I will place the referral.

## 2019-09-27 ENCOUNTER — Other Ambulatory Visit: Payer: Self-pay

## 2019-09-27 DIAGNOSIS — G8929 Other chronic pain: Secondary | ICD-10-CM

## 2019-09-27 MED ORDER — HYDROCODONE-ACETAMINOPHEN 10-325 MG PO TABS: 1.0000 | ORAL_TABLET | Freq: Four times a day (QID) | ORAL | 0 refills | Status: DC | PRN

## 2019-10-26 ENCOUNTER — Other Ambulatory Visit: Payer: Self-pay

## 2019-10-26 DIAGNOSIS — G8929 Other chronic pain: Secondary | ICD-10-CM

## 2019-10-26 MED ORDER — HYDROCODONE-ACETAMINOPHEN 10-325 MG PO TABS: 1.0000 | ORAL_TABLET | Freq: Four times a day (QID) | ORAL | 0 refills | Status: DC | PRN

## 2019-11-26 ENCOUNTER — Telehealth: Payer: Self-pay

## 2019-11-26 ENCOUNTER — Other Ambulatory Visit: Payer: Self-pay

## 2019-11-26 DIAGNOSIS — M25551 Pain in right hip: Secondary | ICD-10-CM

## 2019-11-26 DIAGNOSIS — G8929 Other chronic pain: Secondary | ICD-10-CM

## 2019-11-26 MED ORDER — HYDROCODONE-ACETAMINOPHEN 10-325 MG PO TABS: 1.0000 | ORAL_TABLET | Freq: Four times a day (QID) | ORAL | 0 refills | Status: DC | PRN

## 2019-11-26 NOTE — Telephone Encounter (Signed)
Patient calls nurse line stating that she still has not heard anything regarding referral to [facility]. Informed patient that referral was placed on 3/29. Patient states that she contacted the office a few weeks ago and that they still had not received it. Can we resend the referral to the office.   Forwarding to Zackery Barefoot, RN

## 2019-12-27 ENCOUNTER — Telehealth: Payer: Self-pay | Admitting: Family Medicine

## 2019-12-27 ENCOUNTER — Other Ambulatory Visit: Payer: Self-pay | Admitting: Family Medicine

## 2019-12-27 DIAGNOSIS — G8929 Other chronic pain: Secondary | ICD-10-CM

## 2019-12-27 MED ORDER — HYDROCODONE-ACETAMINOPHEN 10-325 MG PO TABS: 1.0000 | ORAL_TABLET | Freq: Four times a day (QID) | ORAL | 0 refills | Status: DC | PRN

## 2019-12-27 NOTE — Progress Notes (Signed)
Received message from after hours call of patient requesting long standing prescription of norco 10-325 #120 x30 days. H/O avascular necrosis of hip. This patient newly on my service as transferred from Dr. Linwood Dibbles (graduated). PDMP reviewed and appropriate. Will require appointment with me for continued refills.   Shirlean Mylar, MD [facility], PGY-2

## 2019-12-27 NOTE — Telephone Encounter (Signed)
**  After hours emergency line call**  Angela Walls is a 39 year old female with a history of chronic right hip pain 2/2 avascular necrosis on chronic opioid dependence, SLE, and hypertension calling into the after-hours line to discuss her pain medication.  She called the office on Friday to ask for her Norco prescription, however the recording informed her to call her pharmacy for prescription refills.  She called the pharmacy and they stated that that is not something they do, however by the time that she had done this it was after hours so she could not call back the clinic.  She has been out of her Norco over the weekend and is hoping to have a refill.  Discussed that I would route this message to her PCP, Dr. Leary Roca, to have her chronic medication refilled.  Also instructed she should schedule an appointment in the next 1-2 months to establish care as her PCP recently switched from Dr. Linwood Dibbles (graduated).  Patient understands and is very reasonable.   Angela Stack, DO

## 2020-01-25 ENCOUNTER — Other Ambulatory Visit: Payer: Self-pay

## 2020-01-25 DIAGNOSIS — G8929 Other chronic pain: Secondary | ICD-10-CM

## 2020-01-27 MED ORDER — HYDROCODONE-ACETAMINOPHEN 10-325 MG PO TABS: 1.0000 | ORAL_TABLET | Freq: Four times a day (QID) | ORAL | 0 refills | Status: DC | PRN

## 2020-01-27 NOTE — Telephone Encounter (Signed)
Will refill for 1 month, and can subsequently continue refills after appointment on 8/19 with me.  Shirlean Mylar, MD [facility] Family Medicine Residency, PGY-2

## 2020-01-28 ENCOUNTER — Telehealth: Payer: Self-pay | Admitting: Family Medicine

## 2020-01-28 NOTE — Telephone Encounter (Signed)
Called pharmacy---awaiting prior authorization. Confirmed with pharmacy, we are unable to complete PA over weekend.  Terisa Starr, MD  Family Medicine Teaching Service

## 2020-01-28 NOTE — Telephone Encounter (Signed)
**  AFTER HOURS CALL**   Patient calls line with request to speak with PCP in order to refill her pain medication, Noco. Patient reports having a problem with a pre-authorization form that she reports was sent to the [facility] on 01/24/20. She reports that she has been calling the [facility] since 3:00 PM today.  She reporting that she has been taking it often lately as she was told she had vascular necrosis. Patient states she has not been able to get her medication in 6 days and was supposed to have it on the 4th of August. She expresses concern  That she will not be able to manage pain over the weekend and is requesting a prescription refill.  I explained to patient that this is an emergency triage line and not for use of calling in prescriptions and that she may need to be seen at urgent care center in order to have prescription sooner than Monday, 8/9.  Patient verbalized understanding with the plan that she would call the pharmacy to see if they will fill her prescription from an urgent care center and will discuss cost.  Discussed phone call with Dr. Manson Passey who agreed to further investigate prior authorization form.   Ronnald Ramp, MD  [facility] Service, PGY-2

## 2020-01-31 ENCOUNTER — Telehealth: Payer: Self-pay | Admitting: *Deleted

## 2020-01-31 NOTE — Telephone Encounter (Signed)
Received fax from pharmacy, PA needed on hydrocodone.  Clinical questions submitted via Cover My Meds.  Waiting on response, could take up to 72 hours.  Cover My Meds info: Key: [id number]  [name], CMA

## 2020-02-01 NOTE — Telephone Encounter (Signed)
    LMOVM of pharmacy informing of approval . [name], CMA

## 2020-02-10 ENCOUNTER — Ambulatory Visit: Payer: Medicaid Other | Admitting: Family Medicine

## 2020-02-11 NOTE — Telephone Encounter (Signed)
I do not know.  You would probably have to make a call to the insurance company. [name], CMA

## 2020-02-11 NOTE — Telephone Encounter (Signed)
Patient LVM on nurse line stating we need to change the authorization dates. Patient reports she picked up the medication on the 7th, she paid cash. In order to get a refund we need change the start date from 8/9 to 8/7.   Is this something we can even do?

## 2020-02-16 ENCOUNTER — Ambulatory Visit: Payer: Medicaid Other | Admitting: Family Medicine

## 2020-02-23 ENCOUNTER — Other Ambulatory Visit: Payer: Self-pay

## 2020-02-23 ENCOUNTER — Ambulatory Visit (INDEPENDENT_AMBULATORY_CARE_PROVIDER_SITE_OTHER): Payer: Medicaid Other | Admitting: Family Medicine

## 2020-02-23 ENCOUNTER — Telehealth: Payer: Self-pay

## 2020-02-23 VITALS — BP 128/90 | HR 74 | Wt 181.0 lb

## 2020-02-23 DIAGNOSIS — M329 Systemic lupus erythematosus, unspecified: Secondary | ICD-10-CM | POA: Diagnosis not present

## 2020-02-23 DIAGNOSIS — M87051 Idiopathic aseptic necrosis of right femur: Secondary | ICD-10-CM

## 2020-02-23 DIAGNOSIS — M25551 Pain in right hip: Secondary | ICD-10-CM

## 2020-02-23 DIAGNOSIS — M545 Low back pain, unspecified: Secondary | ICD-10-CM

## 2020-02-23 DIAGNOSIS — G8929 Other chronic pain: Secondary | ICD-10-CM

## 2020-02-23 MED ORDER — DICLOFENAC SODIUM 1 % EX GEL: 2.0000 g | Freq: Four times a day (QID) | CUTANEOUS | 0 refills | Status: DC

## 2020-02-23 NOTE — Assessment & Plan Note (Signed)
No alarm symptoms.  Overall reassuring exam and likely musculoskeletal.  Due to her history of long-term steroid use, we will obtain a lumbar spine x-ray to ensure there is no evidence of compression fracture. -Topical Voltaren gel -Follow-up x-ray lumbar spine -Return to clinic if no significant improvement in the next 2-4 weeks

## 2020-02-23 NOTE — Assessment & Plan Note (Signed)
She would like to follow-up with Dr. Gerlene Burdock with [facility] for a second opinion regarding a hip replacement.  The phone number for Dr. Gerlene Burdock was provided for Ms. Desanto to call and schedule an appointment.  A referral was previously placed so an additional referral is not necessary at this time.

## 2020-02-23 NOTE — Progress Notes (Signed)
SUBJECTIVE:   CHIEF COMPLAINT / HPI:   Low back pain Angela Walls reports that she has had a sharp and achy back pain for roughly the past week.  She does not recall any specific traumatic event or injury.  She does not remember any situations where she was lifting heavy weights recently.  She describes the pain as a sharp pain moving from her left low back to left hip.  The pain is currently a 3-4/10 in intensity although it was significantly worse 1 week ago.  Her pain was bad enough that she ultimately stayed home from her doctor's appointment instead of meeting her PCP.  In addition to her chronic pain regimen (Norco 10 mg 4 times daily) she has been taking some Tylenol and Motrin on top of it without significant relief.  The only exercise or activity that seems to specifically exacerbate the pain is twisting of her abdomen.  Due to her lupus, she does have a history of previous long-term steroid use.  She reports that she is previously on steroids for years.  She is not currently taking steroids.  Lupus She has previously received care from her rheumatologist other does not currently seeing a rheumatologist.  Referral has been placed previously but she reports that she never got a follow-up phone call.  Avascular necrosis As a result of her avascular necrosis, she is treated with daily narcotics.  She notes that she also walks with a chronic compensatory stride.  This does seem to be impacting her daily life.  She is previously met with an orthopedist who recommended surgery.  She would like a second opinion.  She would like to meet with a Dr. Delfino Walls of [facility].  This referral has previously been placed.  PERTINENT  PMH / PSH: Lupus, avascular necrosis, chronic right hip pain  OBJECTIVE:   BP 128/90   Pulse 74   Wt 181 lb (82.1 kg)   LMP 01/23/2020 (Approximate)   BMI 30.12 kg/m    General: Well-appearing young woman resting comfortably in her exam chair.  Able to stand up  and walk over to get up on the exam table.  Mildly antalgic gait. Low back: No tenderness with percussion of the lumbar vertebrae.  No significant paraspinal tenderness.  Mild tenderness of the right SI joint.  No tenderness of the upper gluteal muscles.  Straight leg raise negative bilaterally.  Negative FABER and FADIR testing.  No tenderness with palpation of the greater trochanter on the left side.  ASSESSMENT/PLAN:   Low back pain No alarm symptoms.  Overall reassuring exam and likely musculoskeletal.  Due to her history of long-term steroid use, we will obtain a lumbar spine x-ray to ensure there is no evidence of compression fracture. -Topical Voltaren gel -Follow-up x-ray lumbar spine -Return to clinic if no significant improvement in the next 2-4 weeks  Avascular necrosis of femur head, right (Angela Walls) She would like to follow-up with Dr. Delfino [facility] with [facility] for a second opinion regarding a hip replacement.  The phone number for Dr. Delfino Walls was provided for Angela Walls to call and schedule an appointment.  A referral was previously placed so an additional referral is not necessary at this time.  SLE Not currently followed by rheumatology.  She was encouraged to establish care with rheumatologist to ensure that she is getting the best and most appropriate treatment. -New referral to rheumatology has been placed     Angela Haymaker, MD Angela Walls

## 2020-02-23 NOTE — Assessment & Plan Note (Signed)
Not currently followed by rheumatology.  She was encouraged to establish care with rheumatologist to ensure that she is getting the best and most appropriate treatment. -New referral to rheumatology has been placed

## 2020-02-23 NOTE — Patient Instructions (Signed)
Low back pain: I think that this is most likely a muscle strain of your lower back.  We will get a lumbar spine x-ray to make sure there is no evidence of compression fracture.  This is a risk in people who have a history of long-term steroid use.  As long as those x-rays are unremarkable, I expect this back pain to improve significantly in the next 2-4 weeks.  For now, you can use topical Voltaren gel and oral Motrin for pain control.  Orthopedic referral: The previous referral to Dr. Gerlene Burdock is still valid.  At this point, I recommend that you call his clinic at the following number: [phone].  Rheumatology referral: I will place a new referral to rheumatology.  Please let me know if you have not received a call in the next 2 weeks about scheduling an appointment with rheumatology.

## 2020-02-23 NOTE — Telephone Encounter (Signed)
Patient Angela Walls on nurse line requesting PCP to put in a referral for [facility] in [place]. Patient reports her Ortho would refer himself, however she has Medicaid, and the referral needs to come from her PCP. Please advise.

## 2020-02-24 NOTE — Telephone Encounter (Signed)
Unable to order referral to [facility]. Looks like it is a [employer] operated by [employer]. Happy to help with referral, but need to know what specialty referral and what for in order to do so.  Shirlean Mylar, MD [facility] Family Medicine Residency, PGY-2

## 2020-02-25 NOTE — Addendum Note (Signed)
Addended by: Steva Colder on: 02/25/2020 04:57 PM   Modules accepted: Orders

## 2020-02-25 NOTE — Telephone Encounter (Signed)
Patient states her ortho is wanting to check for significant nerve damage and would like for her to see a Neurologist.   Patient is also requesting a refill on her pain medication. Please advise.

## 2020-02-28 NOTE — Addendum Note (Signed)
Addended by: Horton Chin on: 02/28/2020 11:55 AM   Modules accepted: Orders

## 2020-02-28 NOTE — Telephone Encounter (Signed)
Patient requesting referral to neurology on recommendation of orthopedist. Needs PCP to place referral for insurance purposes. Wishes to go to [facility] in [place]. Order placed.  Shirlean Mylar, MD [facility] Family Medicine Residency, PGY-2

## 2020-02-29 ENCOUNTER — Other Ambulatory Visit: Payer: Self-pay

## 2020-02-29 DIAGNOSIS — M25551 Pain in right hip: Secondary | ICD-10-CM

## 2020-02-29 DIAGNOSIS — G8929 Other chronic pain: Secondary | ICD-10-CM

## 2020-02-29 MED ORDER — HYDROCODONE-ACETAMINOPHEN 10-325 MG PO TABS: 1.0000 | ORAL_TABLET | Freq: Four times a day (QID) | ORAL | 0 refills | Status: DC | PRN

## 2020-02-29 NOTE — Telephone Encounter (Signed)
I called patients insurance company and was able to back date the prior authorization.   Then new effective dates are 01/29/2020-07/29/2020.   The pharmacy has been updated.

## 2020-02-29 NOTE — Telephone Encounter (Signed)
Attempted to call patient to discuss medications. 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 Missed appointment with me in August, but seen by Dr. Homero Fellers on 9/1. Patient needs a urine drug screen as part of her narcotics contract. There is a future UDS lab order already in place. I will refill medications today, but for future refills, she must present to clinic for UDS or I cannot refill them. I attempted to contact her today to discuss this, however her VM was full.  Shirlean Mylar, MD [facility] Family Medicine Residency, PGY-2

## 2020-03-01 NOTE — Telephone Encounter (Signed)
Patient calls nurse line to check on status of referrals. Informed patient of process for referrals and 1-2 week turn around time for referrals.   Patient verbalized understanding.   Veronda Prude, RN

## 2020-03-22 NOTE — Telephone Encounter (Signed)
Patient calls nurse line regarding not being able to schedule at [facility]. Patient states that she has called the clinic this morning and they have still not received referral. It seems that referral was faxed on 03/06/20, however, patient states that [place] clinic states they have not received.   Patient states that after speaking with [facility] clinic, they state that we can "call in" the referral to their office at [phone].   Sending to referral coordinator.   Veronda Prude, RN

## 2020-03-22 NOTE — Telephone Encounter (Signed)
Attempted to call [facility] clinic neurology twice today.  Was unable to reach anyone the first time and the second time they were already closed (434pm).  Will try calling again tomorrow.  Ruthene Methvin,CMA

## 2020-03-23 NOTE — Telephone Encounter (Signed)
Spoke with [facility] neurology clinic and received a new fax number.  Resent referral and they will reach out to contact patient.  Ottie Neglia,CMA

## 2020-03-24 ENCOUNTER — Other Ambulatory Visit: Payer: Self-pay

## 2020-03-24 DIAGNOSIS — G8929 Other chronic pain: Secondary | ICD-10-CM

## 2020-03-24 MED ORDER — HYDROCODONE-ACETAMINOPHEN 10-325 MG PO TABS: 1.0000 | ORAL_TABLET | Freq: Four times a day (QID) | ORAL | 0 refills | Status: DC | PRN

## 2020-03-27 ENCOUNTER — Telehealth: Payer: Self-pay | Admitting: *Deleted

## 2020-03-27 NOTE — Telephone Encounter (Signed)
Received fax from [facility] Neuro stating that they don't treat chronic low back pain.  Unless patient is having radiating symptoms then they won't be able to see her.  I tried to call patient but she didn't pick up and there was no voicemail.  Will document in referral and update provider.  Will need to speak with patient to see if there is another office that she would like to try.  Prescilla Monger,CMA

## 2020-03-29 NOTE — Telephone Encounter (Signed)
Attempted to call patient at phone number on file.  No one answered, unable to leave voicemail.  Will send letter.  Patient had requested a neurology referral to [facility] clinic, however they do not see patients for her specific request.  Will resend referral if she identifies a another neurologist she would like to see.  Shirlean Mylar, MD [facility] Family Medicine Residency, PGY-2

## 2020-04-27 ENCOUNTER — Other Ambulatory Visit: Payer: Self-pay

## 2020-04-27 DIAGNOSIS — G8929 Other chronic pain: Secondary | ICD-10-CM

## 2020-04-27 DIAGNOSIS — M25551 Pain in right hip: Secondary | ICD-10-CM

## 2020-04-27 MED ORDER — HYDROCODONE-ACETAMINOPHEN 10-325 MG PO TABS
1.0000 | ORAL_TABLET | Freq: Four times a day (QID) | ORAL | 0 refills | Status: DC | PRN
Start: 2020-04-27 — End: 2020-05-26

## 2020-05-10 ENCOUNTER — Encounter: Payer: Self-pay | Admitting: Family Medicine

## 2020-05-10 ENCOUNTER — Ambulatory Visit: Payer: Medicaid Other | Admitting: Family Medicine

## 2020-05-10 ENCOUNTER — Other Ambulatory Visit: Payer: Self-pay

## 2020-05-10 VITALS — BP 120/72 | HR 84 | Ht 65.0 in | Wt 183.6 lb

## 2020-05-10 DIAGNOSIS — Z79891 Long term (current) use of opiate analgesic: Secondary | ICD-10-CM

## 2020-05-10 NOTE — Patient Instructions (Signed)
It was a great seeing you today!  Today we discussed the following:  Hip pain: You have been given the phone number to the rheumatologist to which we referred you.  Please follow-up with them.  Pap smear: Due to the results of your last Pap smear you are due for another Pap smear with HPV testing in 2022.  We will be getting a urine sample from you today.  If we have any concerns, we will call you.  If you have any questions please not hesitate to contact our office.

## 2020-05-10 NOTE — Progress Notes (Deleted)
    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 Pap in 2019 was ASCUS and HPV negative. ASCCP guidelines recommend 3 year follow up, patient due in 2022 for repeat pap with co-testing.  PERTINENT  PMH / PSH: ***  OBJECTIVE:   There were no vitals taken for this visit.  HEENT: EOMI. Sclera without injection or icterus. MMM. External auditory canal examined and WNL. TM normal appearance, no erythema or bulging. Neck: Supple.  Cardiac: Regular rate and rhythm. Normal S1/S2. No murmurs, rubs, or gallops appreciated. Lungs: Clear bilaterally to ascultation.  Abdomen: Normoactive bowel sounds. No tenderness to deep or light palpation. No rebound or guarding.  ***  Neuro: *** Ext: ***  Psych: Pleasant and appropriate     ASSESSMENT/PLAN:   No problem-specific Assessment & Plan notes found for this encounter.     Shirlean Mylar, MD [facility]

## 2020-05-10 NOTE — Progress Notes (Signed)
    SUBJECTIVE:   CHIEF COMPLAINT / HPI:   Hip pain:  Patient has a history of avascular necrosis.  She states that she previously had a bilaterally, but the left femoral head healed well the right did not.  Patient says that she has previously met with surgery in the last year and it was determined that she will need surgery as an outcome, but patient states that she is not ready to have it yet.  Patient reports that since she has been out of work for the last 2 years due to her shoulder injury, she has not been on her legs as much and feels that this time of rest has helped with her pain.  She currently is doing some stretching exercises with improvement though she says that the next day the pain is higher. -Chronic pain regimen of Norco 10 mg 4 times daily  History of lupus: Patient was previously seen by rheumatology.  Referral was previously placed, patient was given the phone number to the rheumatology practice that was referred to.  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 Pap in 2019 was ASCUS and HPV negative. ASCCP guidelines recommend 3 year follow up, patient due in 2022 for repeat pap with co-testing.  PERTINENT  PMH / PSH: Reviewed  OBJECTIVE:   Blood pressure 120/72, pulse 84, height $RemoveBe'5\' 5"'NoFaEzCwn$  (1.651 m), weight 183 lb 9.6 oz (83.3 kg), SpO2 100 %.  HEENT: EOMI. Sclera without injection or icterus. MMM. External auditory canal examined and WNL Neck: Supple.  Cardiac: Regular rate and rhythm. Normal S1/S2. No murmurs, rubs, or gallops appreciated. Lungs: Clear bilaterally to ascultation.  Abdomen: Normoactive bowel sounds. No tenderness to deep or light palpation. No rebound or guarding.   Neuro: No focal deficits Ext: Left hip pain with passive flexion and FABER positioning.  Right hip with full range of motion without pain.  Bilateral upper extremities with full ROM and 5/5 strength Psych: Pleasant  and appropriate     ASSESSMENT/PLAN:   Encounter for long-term opiate analgesic use Patient on long-term chronic pain management with Norco 10 mg 4 times daily.  Patient reports compliance. -UDS to confirm compliance.  Sample sent to toxicology as POCT UDS was negative.  Unsure of when patient took last dose of medication, concern for inappropriate medication use.  Health maintenance: -Pap smear to be completed 2022   Angela Peterka, DO

## 2020-05-10 NOTE — Assessment & Plan Note (Addendum)
Patient on long-term chronic pain management with Norco 10 mg 4 times daily.  Patient reports compliance. -UDS to confirm compliance.  Sample sent to toxicology as POCT UDS was negative.  Unsure of when patient took last dose of medication, concern for inappropriate medication use.

## 2020-05-17 LAB — TOXASSURE SELECT 13 (MW), URINE

## 2020-05-22 DIAGNOSIS — M542 Cervicalgia: Secondary | ICD-10-CM | POA: Diagnosis not present

## 2020-05-22 DIAGNOSIS — R202 Paresthesia of skin: Secondary | ICD-10-CM | POA: Diagnosis not present

## 2020-05-22 DIAGNOSIS — R200 Anesthesia of skin: Secondary | ICD-10-CM | POA: Diagnosis not present

## 2020-05-22 DIAGNOSIS — M25512 Pain in left shoulder: Secondary | ICD-10-CM | POA: Diagnosis not present

## 2020-05-25 ENCOUNTER — Telehealth: Payer: Self-pay

## 2020-05-25 NOTE — Telephone Encounter (Signed)
Patient calls nurse line with concerns for possible UTI. Patient reports increased urinary frequency and lower abdominal pressure. Patient does report changing from pads to tampons when on menstrual cycle last week and is unsure if that could be the reason for symptoms.   Advised patient to schedule OV for evaluation. Patient scheduled for tomorrow afternoon at 1430.  FYI to PCP  Veronda Prude, RN

## 2020-05-26 ENCOUNTER — Other Ambulatory Visit: Payer: Self-pay | Admitting: *Deleted

## 2020-05-26 ENCOUNTER — Ambulatory Visit: Payer: Medicaid Other

## 2020-05-26 DIAGNOSIS — G8929 Other chronic pain: Secondary | ICD-10-CM

## 2020-05-26 DIAGNOSIS — M25551 Pain in right hip: Secondary | ICD-10-CM

## 2020-05-26 MED ORDER — HYDROCODONE-ACETAMINOPHEN 10-325 MG PO TABS: 1.0000 | ORAL_TABLET | Freq: Four times a day (QID) | ORAL | 0 refills | Status: DC | PRN

## 2020-06-03 ENCOUNTER — Telehealth: Payer: Self-pay | Admitting: Family Medicine

## 2020-06-03 NOTE — Telephone Encounter (Signed)
Called patient to schedule appointment to meet PCP and also discuss health concerns including chronic pain. Patient to bring medications and time last used pain medication to visit.  Shirlean Mylar, MD [facility] Family Medicine Residency, PGY-2

## 2020-06-20 NOTE — Progress Notes (Signed)
    SUBJECTIVE:   CHIEF COMPLAINT / HPI:   H/o avascular necrosis of hip: patient has been on long term use of an opiate for h/o avascular necrosis of hip. She has chronic pain in her hip and has difficulty with walking, cannot cross her legs, etc. Patient uses narcotics at least twice every day, sometimes more. Last use was 11PM last night. Pain is worst at night with aching and throbbing that prevents sleep. Patient realizes that she will need surgery as a definitive solution to her problem, but due to her age, she is concerned about moving forward. Patient has tried to cut back on narcotics use in the past without improvement.   Urinary frequency: patient is concerned for UTI as she has noticed an increase in urinary urgency and frequency, no dysuria. No fever, flank pain. No vaginal discharge.  PERTINENT  PMH / PSH: see above  OBJECTIVE:   BP 130/80   Pulse 85   Ht 5\' 5"  (1.651 m)   Wt 185 lb 6.4 oz (84.1 kg)   LMP 06/12/2020 (Exact Date)   SpO2 98%   BMI 30.85 kg/m   Nursing note and vitals reviewed GEN: age-appropriate AAF resting comfortably in chair, NAD, WNWD HEENT: NCAT. Sclera without injection or icterus. MMM.  Neck: Supple.  Neuro: Alert and at baseline Ext: no edema Psych: Pleasant and appropriate   ASSESSMENT/PLAN:   Avascular necrosis of femur head, right (HCC) Patient has not followed up with Dr. 06/14/2020 of [facility], recommend she call him or can consider Peggye Pitt, also of [facility]. Referral is active, does not require another one at this time. Patient understands that surgery will eventually be required.  HYPERTENSION, BENIGN SYSTEMIC At goal of 130/80 today. No change.  Encounter for long-term opiate analgesic use Patient has not used narcotics today, last use 11 PM last night. Will not obtain UDS, counseled on long term use. Patient reports goal is to decrease use, currently using at least 2x per day every day, often 4x. Current rx  appropriate for this use. If patient requires further pain treatment, recommend referral to pain clinic for complex chronic pain. See above re: hip surgery.  SLE Patient reports difficulty getting into Rheum office due to referral since September. Referral was re-faxed per our records. Instructed patient to contact [facility] again and if any problem scheduling to let October know.     [place], MD [facility]

## 2020-06-20 NOTE — Patient Instructions (Addendum)
It was a pleasure to see you today!  1. We will get some labs today.  If they are abnormal or we need to do something about them, I will call you.  If they are normal, I will send you a message on MyChart (if it is active) or a letter in the mail.  If you don't hear from [place] in 2 weeks, please call the office  [phone].  2. Look into seeing Dr. Mariana Arn at [facility]. You presently have a referral to [facility], so if you have any problems, please let us know and I can help facilitate this. Same goes for the rheumatology referral.  Be Well and Happy New Year!  Dr. Leary Roca

## 2020-06-21 ENCOUNTER — Ambulatory Visit: Payer: Medicaid Other | Admitting: Family Medicine

## 2020-06-21 ENCOUNTER — Encounter: Payer: Self-pay | Admitting: Family Medicine

## 2020-06-21 ENCOUNTER — Other Ambulatory Visit: Payer: Self-pay

## 2020-06-21 VITALS — BP 130/80 | HR 85 | Ht 65.0 in | Wt 185.4 lb

## 2020-06-21 DIAGNOSIS — Z79891 Long term (current) use of opiate analgesic: Secondary | ICD-10-CM

## 2020-06-21 DIAGNOSIS — R350 Frequency of micturition: Secondary | ICD-10-CM

## 2020-06-21 DIAGNOSIS — M87051 Idiopathic aseptic necrosis of right femur: Secondary | ICD-10-CM | POA: Diagnosis not present

## 2020-06-21 DIAGNOSIS — I10 Essential (primary) hypertension: Secondary | ICD-10-CM

## 2020-06-21 DIAGNOSIS — M329 Systemic lupus erythematosus, unspecified: Secondary | ICD-10-CM | POA: Diagnosis not present

## 2020-06-21 LAB — POCT URINALYSIS DIP (MANUAL ENTRY)
Bilirubin, UA: NEGATIVE
Blood, UA: NEGATIVE
Glucose, UA: NEGATIVE mg/dL
Ketones, POC UA: NEGATIVE mg/dL
Leukocytes, UA: NEGATIVE
Nitrite, UA: NEGATIVE
Protein Ur, POC: NEGATIVE mg/dL
Spec Grav, UA: 1.025 (ref 1.010–1.025)
Urobilinogen, UA: 0.2 E.U./dL
pH, UA: 7 (ref 5.0–8.0)

## 2020-06-21 NOTE — Assessment & Plan Note (Signed)
Patient has not followed up with Dr. Peggye Pitt of [facility], recommend she call him or can consider Mariana Arn, also of [facility]. Referral is active, does not require another one at this time. Patient understands that surgery will eventually be required.

## 2020-06-21 NOTE — Assessment & Plan Note (Signed)
Patient has not used narcotics today, last use 11 PM last night. Will not obtain UDS, counseled on long term use. Patient reports goal is to decrease use, currently using at least 2x per day every day, often 4x. Current rx appropriate for this use. If patient requires further pain treatment, recommend referral to pain clinic for complex chronic pain. See above re: hip surgery.

## 2020-06-21 NOTE — Assessment & Plan Note (Signed)
Patient reports difficulty getting into Rheum office due to referral since September. Referral was re-faxed per our records. Instructed patient to contact [facility] again and if any problem scheduling to let us know.

## 2020-06-21 NOTE — Assessment & Plan Note (Signed)
At goal of 130/80 today. No change.

## 2020-06-23 ENCOUNTER — Telehealth: Payer: Self-pay | Admitting: Family Medicine

## 2020-06-23 LAB — URINE CULTURE

## 2020-06-23 NOTE — Telephone Encounter (Signed)
Patient calls after-hours call line regarding urine culture results.  She reports that she is still having discomfort with urination and mild abdominal pain.  She was under the impression that a antibiotic would be sent to her pharmacy when the culture results came back.  I discussed that her urine culture did not indicate that she had a UTI.  I asked about other symptoms and she did acknowledge that over the last day or so she has had vaginal discharge.  She reports that she is not sexually active and has not been.  I recommended if symptoms worsen she seek medical attention at an urgent care or make an appointment with our clinic.  I offered to make an appointment for the patient but she reports she will call Monday to speak with the provider that she saw.  I let her know I would send a message to Dr. Leary Roca regarding this call.  Patient is pleased.  Strict ED precautions given and patient is agreeable to this.

## 2020-06-26 ENCOUNTER — Other Ambulatory Visit: Payer: Self-pay | Admitting: Family Medicine

## 2020-06-26 ENCOUNTER — Encounter: Payer: Self-pay | Admitting: Family Medicine

## 2020-06-26 DIAGNOSIS — B3731 Acute candidiasis of vulva and vagina: Secondary | ICD-10-CM

## 2020-06-26 DIAGNOSIS — B373 Candidiasis of vulva and vagina: Secondary | ICD-10-CM

## 2020-06-26 MED ORDER — FLUCONAZOLE 150 MG PO TABS: 150.0000 mg | ORAL_TABLET | Freq: Once | ORAL | 0 refills | Status: AC

## 2020-06-29 ENCOUNTER — Other Ambulatory Visit: Payer: Self-pay

## 2020-06-29 DIAGNOSIS — G8929 Other chronic pain: Secondary | ICD-10-CM

## 2020-06-29 DIAGNOSIS — M25551 Pain in right hip: Secondary | ICD-10-CM

## 2020-06-30 MED ORDER — HYDROCODONE-ACETAMINOPHEN 10-325 MG PO TABS: 1.0000 | ORAL_TABLET | Freq: Four times a day (QID) | ORAL | 0 refills | Status: DC | PRN

## 2020-07-26 ENCOUNTER — Other Ambulatory Visit: Payer: Self-pay

## 2020-07-26 DIAGNOSIS — M25551 Pain in right hip: Secondary | ICD-10-CM

## 2020-07-26 DIAGNOSIS — G8929 Other chronic pain: Secondary | ICD-10-CM

## 2020-07-26 MED ORDER — HYDROCODONE-ACETAMINOPHEN 10-325 MG PO TABS: 1.0000 | ORAL_TABLET | Freq: Four times a day (QID) | ORAL | 0 refills | Status: DC | PRN

## 2020-08-01 ENCOUNTER — Telehealth: Payer: Self-pay

## 2020-08-01 NOTE — Telephone Encounter (Signed)
PA needed on Hydrocodone. Clinical questions submitted via Cover My Meds.  Waiting on response, could take up to 72 hours.  Cover My Meds info: Key: [id number]

## 2020-08-25 ENCOUNTER — Other Ambulatory Visit: Payer: Self-pay

## 2020-08-25 DIAGNOSIS — M79602 Pain in left arm: Secondary | ICD-10-CM | POA: Diagnosis not present

## 2020-08-25 DIAGNOSIS — G8929 Other chronic pain: Secondary | ICD-10-CM

## 2020-08-26 MED ORDER — HYDROCODONE-ACETAMINOPHEN 10-325 MG PO TABS: 1.0000 | ORAL_TABLET | Freq: Four times a day (QID) | ORAL | 0 refills | Status: DC | PRN

## 2020-09-25 ENCOUNTER — Other Ambulatory Visit: Payer: Self-pay

## 2020-09-25 DIAGNOSIS — G8929 Other chronic pain: Secondary | ICD-10-CM

## 2020-09-25 DIAGNOSIS — M25551 Pain in right hip: Secondary | ICD-10-CM

## 2020-09-25 MED ORDER — HYDROCODONE-ACETAMINOPHEN 10-325 MG PO TABS: 1.0000 | ORAL_TABLET | Freq: Four times a day (QID) | ORAL | 0 refills | Status: DC | PRN

## 2020-10-26 ENCOUNTER — Other Ambulatory Visit: Payer: Self-pay

## 2020-10-26 DIAGNOSIS — G8929 Other chronic pain: Secondary | ICD-10-CM

## 2020-10-26 DIAGNOSIS — M25551 Pain in right hip: Secondary | ICD-10-CM

## 2020-10-26 MED ORDER — HYDROCODONE-ACETAMINOPHEN 10-325 MG PO TABS: 1.0000 | ORAL_TABLET | Freq: Four times a day (QID) | ORAL | 0 refills | Status: DC | PRN

## 2020-11-24 ENCOUNTER — Other Ambulatory Visit: Payer: Self-pay

## 2020-11-24 DIAGNOSIS — G8929 Other chronic pain: Secondary | ICD-10-CM

## 2020-11-25 MED ORDER — HYDROCODONE-ACETAMINOPHEN 10-325 MG PO TABS: 1.0000 | ORAL_TABLET | Freq: Four times a day (QID) | ORAL | 0 refills | Status: DC | PRN

## 2020-11-29 NOTE — Progress Notes (Signed)
    SUBJECTIVE:   CHIEF COMPLAINT / HPI: pap smear  Pap smear: patient last had pap smear in December 2019 with ASCUS results. H/o LSIL in 2009. Repeat pap today. Patient requests STI testing, no current symptoms.   HTN: BP today is appropriate at 138/80. Patient reports that she woke up late and did not yet take her pain pill for her hip (SLE, h/o avascular necrosis), and is in pain, she reports she expected BP to be higher. She had h/o HTN in 2020, was previously on HCTZ, not currently on medication and has been controlled in the last year. Asymptomatic  PERTINENT  PMH / PSH: h/o htn, SLE, h/o right hip avascular necrosis  OBJECTIVE:   BP 138/82   Pulse 79   Wt 180 lb (81.6 kg)   LMP 11/15/2020   SpO2 98%   BMI 29.95 kg/m   Nursing note and vitals reviewed GEN: young AAW, resting comfortably in chair, NAD, WNWD Cardiac: Regular rate and rhythm. Normal S1/S2. No murmurs, rubs, or gallops appreciated. 2+ radial pulses. Lungs: Clear bilaterally to ascultation. No increased WOB, no accessory muscle usage. No w/r/r. PELVIC:  Normal appearing external female genitalia, normal vaginal epithelium, no abnormal discharge. Normal appearing cervix. Ext: no edema Psych: Pleasant and appropriate   ASSESSMENT/PLAN:   HYPERTENSION, BENIGN SYSTEMIC Well controlled. Will continue to monitor, no medications at this time.  Atypical squamous cell changes of undetermined significance (ASCUS) on vaginal cytology Pap in 05/2018 with ASCUS, no HPV testing. Prior h/o LSIL in 2009. Repeat pap today with co-testing, next steps based on results.  Routine screening for STI (sexually transmitted infection) Patient reports no symptoms, no recent sexual interaction, but prefers testing.  - HIV, RPR, GC/CT, and wet prep ordered - Hep C screening ordered     Shirlean Mylar, MD [facility]

## 2020-11-29 NOTE — Patient Instructions (Addendum)
It was a pleasure to see you today! ? ?We will get some labs today.  If they are abnormal or we need to do something about them, I will call you.  If they are normal, I will send you a message on MyChart (if it is active) or a letter in the mail.  If you don't hear from us in 2 weeks, please call the office  [phone]. ? ? ?Be Well, ? ?Dr. Dorianna Mckiver ? ?

## 2020-11-30 ENCOUNTER — Other Ambulatory Visit: Payer: Self-pay

## 2020-11-30 ENCOUNTER — Other Ambulatory Visit (HOSPITAL_COMMUNITY)
Admission: RE | Admit: 2020-11-30 | Discharge: 2020-11-30 | Disposition: A | Discharge status: Home / Self Care | Payer: Medicaid Other | Source: Ambulatory Visit | Attending: Family Medicine | Admitting: Family Medicine

## 2020-11-30 ENCOUNTER — Ambulatory Visit: Payer: Medicaid Other | Admitting: Family Medicine

## 2020-11-30 ENCOUNTER — Encounter: Payer: Self-pay | Admitting: Family Medicine

## 2020-11-30 VITALS — BP 138/82 | HR 79 | Wt 180.0 lb

## 2020-11-30 DIAGNOSIS — Z1159 Encounter for screening for other viral diseases: Secondary | ICD-10-CM | POA: Diagnosis not present

## 2020-11-30 DIAGNOSIS — Z124 Encounter for screening for malignant neoplasm of cervix: Secondary | ICD-10-CM | POA: Insufficient documentation

## 2020-11-30 DIAGNOSIS — I10 Essential (primary) hypertension: Secondary | ICD-10-CM

## 2020-11-30 DIAGNOSIS — Z113 Encounter for screening for infections with a predominantly sexual mode of transmission: Secondary | ICD-10-CM

## 2020-11-30 DIAGNOSIS — R87620 Atypical squamous cells of undetermined significance on cytologic smear of vagina (ASC-US): Secondary | ICD-10-CM

## 2020-11-30 LAB — POCT WET PREP (WET MOUNT)
Clue Cells Wet Prep Whiff POC: NEGATIVE
Trichomonas Wet Prep HPF POC: ABSENT

## 2020-11-30 NOTE — Assessment & Plan Note (Signed)
Patient reports no symptoms, no recent sexual interaction, but prefers testing.  - HIV, RPR, GC/CT, and wet prep ordered - Hep C screening ordered

## 2020-11-30 NOTE — Assessment & Plan Note (Signed)
Pap in 05/2018 with ASCUS, no HPV testing. Prior h/o LSIL in 2009. Repeat pap today with co-testing, next steps based on results.

## 2020-11-30 NOTE — Assessment & Plan Note (Signed)
Well controlled. Will continue to monitor, no medications at this time.

## 2020-12-01 LAB — CYTOLOGY - PAP
Adequacy: ABSENT
Chlamydia: NEGATIVE
Comment: NEGATIVE
Comment: NEGATIVE
Comment: NEGATIVE
Comment: NORMAL
Diagnosis: NEGATIVE
High risk HPV: NEGATIVE
Neisseria Gonorrhea: NEGATIVE
Trichomonas: NEGATIVE

## 2020-12-01 LAB — HIV ANTIBODY (ROUTINE TESTING W REFLEX): HIV Screen 4th Generation wRfx: NONREACTIVE

## 2020-12-01 LAB — RPR: RPR Ser Ql: NONREACTIVE

## 2020-12-01 LAB — HEPATITIS C ANTIBODY: Hep C Virus Ab: <0.1 s/co ratio (ref 0.0–0.9)

## 2020-12-06 ENCOUNTER — Other Ambulatory Visit: Payer: Self-pay | Admitting: Family Medicine

## 2020-12-06 DIAGNOSIS — B373 Candidiasis of vulva and vagina: Secondary | ICD-10-CM

## 2020-12-06 DIAGNOSIS — B3731 Acute candidiasis of vulva and vagina: Secondary | ICD-10-CM

## 2020-12-06 MED ORDER — FLUCONAZOLE 150 MG PO TABS: 150.0000 mg | ORAL_TABLET | Freq: Once | ORAL | 0 refills | Status: AC

## 2020-12-26 ENCOUNTER — Other Ambulatory Visit: Payer: Self-pay

## 2020-12-26 DIAGNOSIS — G8929 Other chronic pain: Secondary | ICD-10-CM

## 2020-12-27 MED ORDER — HYDROCODONE-ACETAMINOPHEN 10-325 MG PO TABS: 1.0000 | ORAL_TABLET | Freq: Four times a day (QID) | ORAL | 0 refills | Status: DC | PRN

## 2021-01-10 DIAGNOSIS — R200 Anesthesia of skin: Secondary | ICD-10-CM | POA: Diagnosis not present

## 2021-01-10 DIAGNOSIS — R202 Paresthesia of skin: Secondary | ICD-10-CM | POA: Diagnosis not present

## 2021-01-10 DIAGNOSIS — M542 Cervicalgia: Secondary | ICD-10-CM | POA: Diagnosis not present

## 2021-01-10 DIAGNOSIS — M79602 Pain in left arm: Secondary | ICD-10-CM | POA: Diagnosis not present

## 2021-01-25 ENCOUNTER — Other Ambulatory Visit: Payer: Self-pay

## 2021-01-25 DIAGNOSIS — M25551 Pain in right hip: Secondary | ICD-10-CM

## 2021-01-25 DIAGNOSIS — G8929 Other chronic pain: Secondary | ICD-10-CM

## 2021-01-25 MED ORDER — HYDROCODONE-ACETAMINOPHEN 10-325 MG PO TABS: 1.0000 | ORAL_TABLET | Freq: Four times a day (QID) | ORAL | 0 refills | Status: DC | PRN

## 2021-02-09 ENCOUNTER — Ambulatory Visit: Payer: Medicaid Other | Admitting: Family Medicine

## 2021-02-19 ENCOUNTER — Other Ambulatory Visit: Payer: Self-pay

## 2021-02-19 ENCOUNTER — Ambulatory Visit: Payer: Medicaid Other | Admitting: Family Medicine

## 2021-02-19 DIAGNOSIS — G8929 Other chronic pain: Secondary | ICD-10-CM | POA: Diagnosis not present

## 2021-02-19 DIAGNOSIS — R519 Headache, unspecified: Secondary | ICD-10-CM

## 2021-02-19 NOTE — Progress Notes (Addendum)
    SUBJECTIVE:   CHIEF COMPLAINT / HPI:   Angela Walls (MRN: [id number]) is a 40 y.o. female with a history of HTN, SLE, anemia, and avascular necrosis of femoral head who presents for evaluation of headaches.  Headaches Patient states headaches began 1 month ago. The headaches are localized over the right frontal and temporal area only. They occur about once per week for 2-3 hours at a time. She has been taking Aleve which does help. She notes that she has also noticed her blood pressure being more elevated than usual in the 160s/90s. She has also been eating more fast food and spending more time "in the heat" recently. She denies aura, photophobia, nausea, vomiting, dizziness, and falls since her headaches began.  PERTINENT  PMH / PSH: HTN, SLE, anemia, or avascular necrosis of femoral head. Patient takes Norco 10-325 mg for chronic pain related to avascular necrosis.  OBJECTIVE:   BP 127/85   Pulse 68   Resp (!) 100   Ht 5\' 5"  (1.651 m)   Wt 184 lb (83.5 kg)   SpO2 100%   BMI 30.62 kg/m    PHYSICAL EXAM  GEN: Well-developed, in NAD HEAD: NCAT CVS: RRR, normal S1/S2, no murmurs, rubs, gallops RESP: Breathing comfortably on RA, no retractions, wheezes, rhonchi, or crackles NEU: CN II-XII intact, strength 5/5 in all extremities, sensation intact throughout, normal gait   ASSESSMENT/PLAN:   Chronic headaches Patient's headaches are likely due to a combination of factors. Her recent increase in fast food intake could be contributing to her noticeable increases in BP, and being out in the heat more could be causing mild dehydration. Both of these factors could produce intermittent headaches. Presentation not concerning for migraine, though unilateral, due to absence of photophobia, aura, nausea, and vomiting. Low suspicion for intracranial process as well due to normal neurologic exam and intermittent nature. Though BP at beginning of visit was 150/100 and on recheck was 127/85, will  schedule ambulatory blood pressure monitoring next week. Patient was also advised to attempt more fluid intake and decrease sodium intake. If BP persistently elevated on monitoring, will consider Norvasc 2.5 mg for BP control along with above lifestyle modifications and assess improvement.   , Medical Student [facility]   Resident Attestation  I saw and evaluated the patient, performing the key elements of the service.I  personally performed or re-performed the history, physical exam, and medical decision making activities of this service and have verified that the service and findings are accurately documented in the student's note. I developed the management plan that is described in the medical student's note, and I agree with the content, with my edits above.    [facility], PGY3

## 2021-02-19 NOTE — Patient Instructions (Addendum)
It was so good to meet you today!   You were seen for your headaches and for your blood pressure. Because you noticed that your blood pressure has been increasing, this could be contributing to your headaches. An increase in salt intake and less water out in the sun can also contribute to your headaches.   We have scheduled you an appointment next week to check your blood pressure since it was high when you arrived to your appointment. If it is still high, you may need a low-dose of medication to help bring it down. Until then, try to drink plenty of water and decrease salt intake.  We will see you then! Seek immediate care sooner if further concerns arise.

## 2021-02-19 NOTE — Assessment & Plan Note (Signed)
Patient's headaches are likely due to a combination of factors. Her recent increase in fast food intake could be contributing to her noticeable increases in BP, and being out in the heat more could be causing mild dehydration. Both of these factors could produce intermittent headaches. Presentation not concerning for migraine, though unilateral, due to absence of photophobia, aura, nausea, and vomiting. Low suspicion for intracranial process as well due to normal neurologic exam and intermittent nature. Though BP at beginning of visit was 150/100 and on recheck was 127/85, will schedule ambulatory blood pressure monitoring next week. Patient was also advised to attempt more fluid intake and decrease sodium intake. If BP persistently elevated on monitoring, will consider Norvasc 2.5 mg for BP control along with above lifestyle modifications and assess improvement.

## 2021-02-27 ENCOUNTER — Ambulatory Visit (INDEPENDENT_AMBULATORY_CARE_PROVIDER_SITE_OTHER): Payer: Medicaid Other | Admitting: Pharmacist

## 2021-02-27 ENCOUNTER — Other Ambulatory Visit: Payer: Self-pay

## 2021-02-27 DIAGNOSIS — I10 Essential (primary) hypertension: Secondary | ICD-10-CM | POA: Diagnosis not present

## 2021-02-27 NOTE — Progress Notes (Signed)
   S:    Patient arrives in good spirits.    Presents to the clinic for ambulatory blood pressure evaluation.   Patient was referred and last seen by Primary Care Provider on 02/19/21.   Patient not currently diagnosed with hypertension. Patient reports previously having high blood pressure when pregnant and being treated for several years with labetalol but being off the medication for quite some time due to changing her diet. Blood pressure was elevated in clinic on 02/19/21 but re-check of BP was at goal. Patient has been complaining of an increase in headaches prompting ambulatory blood pressure monitor.   Medication compliance is reported to be optimal.  Discussed procedure for wearing the monitor and gave patient written instructions. Monitor was placed on non-dominant arm with instructions to return in the morning.   Current BP Medications include:  none  Antihypertensives tried in the past include: labetalol 300mg    O:  Physical Exam Neurological:     Mental Status: She is alert and oriented to person, place, and time.    Review of Systems  Cardiovascular:  Negative for leg swelling.   Last 3 Office BP readings: BP Readings from Last 3 Encounters:  02/19/21 127/85  11/30/20 138/82  06/21/20 130/80   Clinical Atherosclerotic Cardiovascular Disease (ASCVD): No  The ASCVD Risk score 06/23/20 DC Jr., et al., 2013) failed to calculate for the following reasons:   The 2013 ASCVD risk score is only valid for ages 62 to 27  Basic Metabolic Panel    Component Value Date/Time   NA 138 12/07/2018 1446   K 4.1 12/07/2018 1446   CL 105 12/07/2018 1446   CO2 20 12/07/2018 1446   GLUCOSE 88 12/07/2018 1446   GLUCOSE 78 04/28/2015 1013   BUN 13 12/07/2018 1446   CREATININE 0.91 12/07/2018 1446   CREATININE 0.84 04/28/2015 1013   CALCIUM 9.2 12/07/2018 1446   GFRNONAA 81 12/07/2018 1446   GFRNONAA 88 06/27/2014 1706   GFRAA 94 12/07/2018 1446   GFRAA >89 06/27/2014 1706     Renal function: CrCl cannot be calculated (Patient's most recent lab result is older than the maximum 21 days allowed.).  Today's Office Blood Pressure (BP) reading: 140/93 mmHg (manual reading)  ABPM Study Data: Arm Placement right arm  Overall Mean 24hr BP:   138/87 mmHg HR: 72  Daytime Mean BP:  143/92 mmHg HR: 77  Nighttime Mean BP:  124/74 mmHg HR: 61  Dipping Pattern: Yes.    Sys:   13.4%   Dia: 18.9%   [normal dipping ~10-20%]  Non-hypertensive ABPM thresholds: daytime BP <125/75 mmHg, sleeptime BP <120/70 mmHg   A/P: Given 24-hour ambulatory blood pressure demonstrates patient has hypertension that is not currently controlled without medication with an average blood pressure of 138/87 mmHg, and a nocturnal dipping pattern that is normal.  Will initiate amlodipine 2.5mg  once daily, which Dr. 08/26/2014 discussed with patient at last appointment.  Results reviewed and written information provided.  Total time in face-to-face counseling 20 minutes.   Blood pressure re-check in four weeks.

## 2021-02-27 NOTE — Patient Instructions (Signed)
Come to your visit tomorrow fasting (no food after midnight)   Blood Pressure Activity Diary Time Lying down/ Sleeping Walking/ Exercise Stressed/ Angry Headache/ Pain Dizzy  9 AM            10 AM            11 AM            12 PM            1 PM            2 PM            Time Lying down/ Sleeping Walking/ Exercise Stressed/ Angry Headache/ Pain Dizzy  3 PM            4 PM             5 PM            6 PM            7 PM            8 PM            Time Lying down/ Sleeping Walking/ Exercise Stressed/ Angry Headache/ Pain Dizzy  9 PM            10 PM            11 PM            12 AM            1 AM            2 AM            3 AM            Time Lying down/ Sleeping Walking/ Exercise Stressed/ Angry Headache/ Pain Dizzy  4 AM            5 AM            6 AM            7 AM            8 AM            9 AM            10 AM              Time you woke up: _________                  Time you went to sleep:__________   Come back tomorrow at 8:30am to have the monitor removed Call the [facility] if you have any questions before then [phone])   Wearing the Blood Pressure Monitor The cuff will inflate every 20 minutes during the day and every 30 minutes while you sleep. Your blood pressure readings will NOT display after cuff inflation Fill out the blood pressure-activity diary during the day, especially during activities that may affect your reading -- such as exercise, stress, walking, taking your blood pressure medications   Important things to know: Avoid taking the monitor off for the next 24 hours, unless it causes you discomfort or pain. Do NOT get the monitor wet and do NOT dry to clean the monitor with any cleaning products. Do NOT put the monitor on anyone else's arm. When the cuff inflates, avoid excess movement. Let the cuffed arm hang loosely, slightly away from the body. Avoid flexing the muscles or moving the hand/fingers. When you go to  sleep, make sure that the hose is not kinked. Remember to fill out the blood pressure activity diary. If you experience severe pain or unusual pain (not associated with getting your blood pressure checked), remove the monitor.   Troubleshooting:  Code  Troubleshooting   1  Check cuff position, tighten cuff   2, 3  Remain still during reading   4, 87  Check air hose connections and make sure cuff is tight   85, 89  Check hose connections and make tubing is not crimped   86  Push START/STOP to restart reading   88, 91  Retry by pushing START/STOP   90  Replace batteries. If problem persists, remove monitor and bring back to   clinic at follow up   97, 98, 99  Service required - Remove monitor and bring back to clinic at follow up

## 2021-02-28 ENCOUNTER — Ambulatory Visit: Payer: Medicaid Other | Admitting: Pharmacist

## 2021-02-28 MED ORDER — AMLODIPINE BESYLATE 2.5 MG PO TABS: 2.5000 mg | ORAL_TABLET | Freq: Every day | ORAL | 0 refills | Status: DC

## 2021-03-02 ENCOUNTER — Other Ambulatory Visit: Payer: Self-pay

## 2021-03-02 DIAGNOSIS — M25551 Pain in right hip: Secondary | ICD-10-CM

## 2021-03-02 DIAGNOSIS — G8929 Other chronic pain: Secondary | ICD-10-CM

## 2021-03-02 MED ORDER — HYDROCODONE-ACETAMINOPHEN 10-325 MG PO TABS: 1.0000 | ORAL_TABLET | Freq: Four times a day (QID) | ORAL | 0 refills | Status: DC | PRN

## 2021-03-05 ENCOUNTER — Telehealth: Payer: Self-pay

## 2021-03-05 NOTE — Telephone Encounter (Signed)
Medication approved through insurance. Status: Approved, Coverage Starts on: 03/05/2021 12:00:00 AM, Coverage Ends on: 09/01/2021 12:00:00 AM.  Called pharmacy and provided with update.   Veronda Prude, RN

## 2021-03-05 NOTE — Telephone Encounter (Signed)
Received fax from pharmacy, PA needed on hydrocodone-acetaminophen.  Clinical questions submitted via Cover My Meds.  Waiting on response, could take up to 72 hours.  Cover My Meds info: Key: [id number]  Veronda Prude, RN

## 2021-03-27 ENCOUNTER — Ambulatory Visit: Payer: Medicaid Other | Admitting: Pharmacist

## 2021-03-28 ENCOUNTER — Ambulatory Visit: Payer: Medicaid Other | Admitting: Pharmacist

## 2021-04-02 ENCOUNTER — Other Ambulatory Visit: Payer: Self-pay

## 2021-04-02 ENCOUNTER — Ambulatory Visit: Payer: Medicaid Other | Admitting: Pharmacist

## 2021-04-02 DIAGNOSIS — M25551 Pain in right hip: Secondary | ICD-10-CM

## 2021-04-02 DIAGNOSIS — I10 Essential (primary) hypertension: Secondary | ICD-10-CM | POA: Diagnosis not present

## 2021-04-02 DIAGNOSIS — G8929 Other chronic pain: Secondary | ICD-10-CM

## 2021-04-02 MED ORDER — AMLODIPINE BESYLATE 5 MG PO TABS: 5.0000 mg | ORAL_TABLET | Freq: Every day | ORAL | 0 refills | Status: DC

## 2021-04-02 MED ORDER — DICLOFENAC SODIUM 1 % EX GEL: 2.0000 g | Freq: Four times a day (QID) | CUTANEOUS | 0 refills | Status: DC

## 2021-04-02 NOTE — Progress Notes (Signed)
    Subjective:    Patient ID: Angela Walls, female    DOB: 1981-03-27, 40 y.o.   MRN: [id number]  HPI Patient is a 40 y.o. female who presents for hypertension management. She is in good spirits and presents without assistance. Patient was referred on  and last seen by Primary Care Provider on 02/19/21. Patient had ambulatory blood pressure monitor with pharmacy clinic on 02/27/21.  Hypertension ROS: taking medications as instructed, no medication side effects noted, patient does not perform home BP monitoring, no chest pain on exertion, no dyspnea on exertion, no swelling of ankles, and no orthostatic dizziness or lightheadedness.   Dietary habits include: Patient reports eating a lot of fast food which she attributes to likely elevating her blood pressure Exercise habits include: Patient reports recently starting to exercise again  Objective:   Last 3 Office BP readings: BP Readings from Last 3 Encounters:  04/02/21 (!) 133/91  02/19/21 127/85  11/30/20 138/82    BMET    Component Value Date/Time   NA 138 12/07/2018 1446   K 4.1 12/07/2018 1446   CL 105 12/07/2018 1446   CO2 20 12/07/2018 1446   GLUCOSE 88 12/07/2018 1446   GLUCOSE 78 04/28/2015 1013   BUN 13 12/07/2018 1446   CREATININE 0.91 12/07/2018 1446   CREATININE 0.84 04/28/2015 1013   CALCIUM 9.2 12/07/2018 1446   GFRNONAA 81 12/07/2018 1446   GFRNONAA 88 06/27/2014 1706   GFRAA 94 12/07/2018 1446   GFRAA >89 06/27/2014 1706    Renal function: CrCl cannot be calculated (Patient's most recent lab result is older than the maximum 21 days allowed.).  Clinical ASCVD: No  The ASCVD Risk score (Arnett DK, et al., 2019) failed to calculate for the following reasons:   The 2019 ASCVD risk score is only valid for ages 30 to 24  BP (!) 133/91   Appearance alert, well appearing, and in no distress and oriented to person, place, and time. General exam BP noted to be mildly elevated today in office.   Assessment/Plan:   Hypertension needs improvement and needs to follow diet more regularly.  Reviewed diet, exercise and weight control. The following changes are to be made: increase amlodipine from 2.5mg  to 5mg  once daily Follow up: 4 weeks and as needed..   Results reviewed and written information provided.   Total time in face-to-face counseling 24 minutes.

## 2021-04-02 NOTE — Patient Instructions (Signed)
Miss Garske it was a pleasure seeing you today.   Your blood pressure today was slightly elevated  Increase amlodipine to 5mg  once daily taking blood pressure medications as prescribed.   Limiting salt intake and caffeine.  Exercising as able for at least 30 minutes for 5 days out of the week, can also help you lower your blood pressure.  Take your blood pressure at home if you are able. Please write down these numbers and bring them to your visits.  If you have any questions please call me

## 2021-04-02 NOTE — Assessment & Plan Note (Signed)
Hypertension needs improvement and needs to follow diet more regularly.  Reviewed diet, exercise and weight control. The following changes are to be made: increase amlodipine from 2.5mg  to 5mg  once daily Follow up: 4 weeks and as needed. 

## 2021-04-03 NOTE — Telephone Encounter (Signed)
Patient returns call to nurse line to check status of rx refill.   Advised of refill policy. Please advise.   Veronda Prude, RN

## 2021-04-04 ENCOUNTER — Telehealth: Payer: Self-pay | Admitting: Family Medicine

## 2021-04-04 MED ORDER — HYDROCODONE-ACETAMINOPHEN 10-325 MG PO TABS: 1.0000 | ORAL_TABLET | Freq: Four times a day (QID) | ORAL | 0 refills | Status: DC | PRN

## 2021-04-04 NOTE — Telephone Encounter (Addendum)
I called and discussed med refill request. I advised her that her PCP is on vacation and the covering provider is on outside rotation, hence the delay for her refill. Med has been refilled. She was appreciative of the call.  NB: She will benefit from tapering down her opioid dose. PCP to follow-up on this.

## 2021-04-04 NOTE — Telephone Encounter (Signed)
I will also review her [place] opioid data base when I have a moment before refills.

## 2021-04-04 NOTE — Telephone Encounter (Signed)
Patient called about medication refill. DR. Lum Babe agreed to fill 7 days until covering doctor could get to it. I told patient and she stated no she doesn't want to do that because she is not paying for medication twice and going to pharmacy multiple times. She is requesting to speak to the Medical Director. Thanks!

## 2021-04-04 NOTE — Telephone Encounter (Signed)
Patient calls nurse checking the status of medication refill. Patient reports she is completely out. Please advise.

## 2021-04-24 ENCOUNTER — Other Ambulatory Visit: Payer: Self-pay | Admitting: Family Medicine

## 2021-04-30 ENCOUNTER — Other Ambulatory Visit: Payer: Self-pay

## 2021-04-30 ENCOUNTER — Ambulatory Visit (INDEPENDENT_AMBULATORY_CARE_PROVIDER_SITE_OTHER): Payer: Medicaid Other | Admitting: Pharmacist

## 2021-04-30 DIAGNOSIS — I10 Essential (primary) hypertension: Secondary | ICD-10-CM | POA: Diagnosis not present

## 2021-04-30 NOTE — Progress Notes (Signed)
    Subjective:    Patient ID: MARLETTE CURVIN, female    DOB: 1981/04/05, 40 y.o.   MRN: [id number]  HPI Patient is a 40 y.o. female who presents for hypertension management. She is in good spirits and presents without assistance. Patient was referred on  and last seen by Primary Care Provider on 02/19/21. Patient had ambulatory blood pressure monitor with pharmacy clinic on 02/27/21.  Patient reports since taking amlodipine and changing her diet she feels a lot better and she reports no more headaches.  Hypertension ROS: taking medications as instructed, no medication side effects noted, home BP monitoring in range of 120's systolic over 80's diastolic, no chest pain on exertion, no dyspnea on exertion, no swelling of ankles, and no orthostatic dizziness or lightheadedness.   Dietary habits include: Decreased fast food consumption since elevation in office blood pressure readings Exercise habits include: Walking 30 minutes 2x/week; stationary bike 15 minutes 2x/week   Objective:   Last 3 Office BP readings: BP Readings from Last 3 Encounters:  04/30/21 130/82  04/02/21 (!) 133/91  02/19/21 127/85    BMET    Component Value Date/Time   NA 138 12/07/2018 1446   K 4.1 12/07/2018 1446   CL 105 12/07/2018 1446   CO2 20 12/07/2018 1446   GLUCOSE 88 12/07/2018 1446   GLUCOSE 78 04/28/2015 1013   BUN 13 12/07/2018 1446   CREATININE 0.91 12/07/2018 1446   CREATININE 0.84 04/28/2015 1013   CALCIUM 9.2 12/07/2018 1446   GFRNONAA 81 12/07/2018 1446   GFRNONAA 88 06/27/2014 1706   GFRAA 94 12/07/2018 1446   GFRAA >89 06/27/2014 1706    Renal function: CrCl cannot be calculated (Patient's most recent lab result is older than the maximum 21 days allowed.).  Clinical ASCVD: No  The ASCVD Risk score (Arnett DK, et al., 2019) failed to calculate for the following reasons:   The 2019 ASCVD risk score is only valid for ages 48 to 38  BP 130/82   Appearance alert, well appearing, and in no  distress and oriented to person, place, and time. General exam BP noted to be at goal in office.  Assessment/Plan:  Hypertension needs improvement and needs to follow diet more regularly.  Current treatment plan is effective, no change in therapy. Reviewed diet, exercise and weight control. Follow up: 1 month and as needed..   Results reviewed and written information provided.   Total time in face-to-face counseling 20 minutes.

## 2021-04-30 NOTE — Assessment & Plan Note (Signed)
Hypertension needs improvement and needs to follow diet more regularly.  Current treatment plan is effective, no change in therapy. Reviewed diet, exercise and weight control. Follow up: 1 month and as needed.Marland Kitchen

## 2021-04-30 NOTE — Patient Instructions (Signed)
Miss Kachel it was a pleasure seeing you today.   Your blood pressure today is improved .   Continue taking blood pressure medications as prescribed.   Limiting salt intake and caffeine.  Exercising as able for at least 30 minutes for 5 days out of the week, can also help you lower your blood pressure.  Take your blood pressure at home if you are able. Please write down these numbers and bring them to your visits.  If you have any questions please call me

## 2021-05-07 ENCOUNTER — Other Ambulatory Visit: Payer: Self-pay

## 2021-05-07 DIAGNOSIS — M25551 Pain in right hip: Secondary | ICD-10-CM

## 2021-05-07 DIAGNOSIS — G8929 Other chronic pain: Secondary | ICD-10-CM

## 2021-05-07 MED ORDER — HYDROCODONE-ACETAMINOPHEN 10-325 MG PO TABS: 1.0000 | ORAL_TABLET | Freq: Four times a day (QID) | ORAL | 0 refills | Status: DC | PRN

## 2021-05-08 ENCOUNTER — Telehealth: Payer: Self-pay | Admitting: *Deleted

## 2021-05-08 DIAGNOSIS — M87051 Idiopathic aseptic necrosis of right femur: Secondary | ICD-10-CM

## 2021-05-08 NOTE — Telephone Encounter (Signed)
Patient called asking for a referral to [facility] for an evaluation of her hip pain.  She currently sees [facility] for her shoulder but they don't offer hip resurfacing.  She isn't ready to get a hip replacement, so she is wanting to see another office. Will forward to MD to place a new ortho referral to Dr. Thamas Jaegers.  Burnard Hawthorne   Orthopaedic and Sports Medicine - [facility] [address] [place], [place] [address]  [phone])

## 2021-05-08 NOTE — Telephone Encounter (Signed)
Patient requests referral for second opinion regarding chronic hip pain. Patient has SLE and had avascular necrosis of the hip. Orthopedic referral is warranted, especially as current clinic does not perform procedure in which patient is interested. Ambulatory referral to orthopedics is placed.  Shirlean Mylar, MD [facility], PGY-3

## 2021-05-09 ENCOUNTER — Other Ambulatory Visit: Payer: Self-pay | Admitting: Student

## 2021-05-09 ENCOUNTER — Other Ambulatory Visit (HOSPITAL_COMMUNITY): Payer: Self-pay | Admitting: Student

## 2021-05-09 DIAGNOSIS — M19012 Primary osteoarthritis, left shoulder: Secondary | ICD-10-CM

## 2021-05-09 DIAGNOSIS — M7582 Other shoulder lesions, left shoulder: Secondary | ICD-10-CM

## 2021-05-09 DIAGNOSIS — M25512 Pain in left shoulder: Secondary | ICD-10-CM

## 2021-05-09 DIAGNOSIS — G8929 Other chronic pain: Secondary | ICD-10-CM

## 2021-05-09 DIAGNOSIS — M7522 Bicipital tendinitis, left shoulder: Secondary | ICD-10-CM

## 2021-05-22 ENCOUNTER — Ambulatory Visit: Payer: Medicaid Other

## 2021-05-22 ENCOUNTER — Other Ambulatory Visit: Payer: Medicaid Other

## 2021-05-29 ENCOUNTER — Other Ambulatory Visit: Payer: Medicaid Other

## 2021-06-06 ENCOUNTER — Other Ambulatory Visit: Payer: Self-pay

## 2021-06-06 DIAGNOSIS — M25551 Pain in right hip: Secondary | ICD-10-CM

## 2021-06-06 DIAGNOSIS — G8929 Other chronic pain: Secondary | ICD-10-CM

## 2021-06-07 DIAGNOSIS — M87050 Idiopathic aseptic necrosis of pelvis: Secondary | ICD-10-CM | POA: Diagnosis not present

## 2021-06-07 DIAGNOSIS — M25551 Pain in right hip: Secondary | ICD-10-CM | POA: Diagnosis not present

## 2021-06-07 MED ORDER — HYDROCODONE-ACETAMINOPHEN 10-325 MG PO TABS: 1.0000 | ORAL_TABLET | Freq: Four times a day (QID) | ORAL | 0 refills | Status: DC | PRN

## 2021-06-07 NOTE — Telephone Encounter (Signed)
Refilled patient's chronic Hydrocod/APAP 10/325 tab #120. Patient will need trial gradual dose reduction by PCP in future.

## 2021-06-10 NOTE — Progress Notes (Deleted)
° ° °  SUBJECTIVE:   CHIEF COMPLAINT / HPI:   Blood Pressure F/U Angela Walls is a 40 y.o. female who presents to the clinic today for follow up on her blood pressure. Had ambulatory BP readings performed Sept 6th which showed overall mean 24hr BP 138/93. Daytime mean BP 143/92. Nightime mean BP 124/74. Was started on Amlodipine 2.5 mg daily. This has since been increased to 5 mg. She reports good compliance***. Lifestyle interventions she has done include ***. Home blood pressure readings range *** systolic and *** diastolic. Denies chest pain, shortness of breath, lower extremity edema, headaches, and vision changes.    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 On chronic narcotics for this.   Hypertension    Lupus (HCC)    Followed by Rheum. On chronic Prednisone.    OBJECTIVE:   There were no vitals taken for this visit. ***  General: NAD, pleasant, able to participate in exam Cardiac: RRR, no murmurs. Respiratory: CTAB, normal effort, No wheezes, rales or rhonchi Abdomen: Bowel sounds present, nontender, nondistended, no hepatosplenomegaly. Extremities: no edema or cyanosis. Skin: warm and dry, no rashes noted Neuro: alert, no obvious focal deficits Psych: Normal affect and mood  ASSESSMENT/PLAN:   No problem-specific Assessment & Plan notes found for this encounter.      Sabino Dick, DO [facility]

## 2021-06-10 NOTE — Patient Instructions (Incomplete)
It was wonderful to see you today.  Please bring ALL of your medications with you to every visit.   Today we talked about:  ** -Your blood pressure today was ***. The goal is <120/80. -Continue your efforts to lose weight and limit salt intake. The recommendation is for 150 minutes of moderate intensity exercise a week. This can be broken down into 30 minutes 5x/week or whatever best fits your schedule.  -For weight management or help with nutritional counseling: Call Dr. Gerilyn Pilgrim (our nutritionist) to set up an appointment. Her phone number is: [phone]. She is currently out of the country but will return in February.    Thank you for choosing [facility].   Please call [phone] with any questions about today's appointment.  Please be sure to schedule follow up at the front  desk before you leave today.   Sabino Dick, DO PGY-2 Family Medicine

## 2021-06-11 ENCOUNTER — Ambulatory Visit: Payer: Medicaid Other | Admitting: Family Medicine

## 2021-06-15 ENCOUNTER — Ambulatory Visit
Admission: RE | Admit: 2021-06-15 | Discharge: 2021-06-15 | Disposition: A | Discharge status: Home / Self Care | Payer: Medicaid Other | Source: Ambulatory Visit | Attending: Student | Admitting: Student

## 2021-06-15 ENCOUNTER — Other Ambulatory Visit: Payer: Self-pay

## 2021-06-15 DIAGNOSIS — G8929 Other chronic pain: Secondary | ICD-10-CM

## 2021-06-15 DIAGNOSIS — M19012 Primary osteoarthritis, left shoulder: Secondary | ICD-10-CM

## 2021-06-15 DIAGNOSIS — M7582 Other shoulder lesions, left shoulder: Secondary | ICD-10-CM

## 2021-06-15 DIAGNOSIS — M67814 Other specified disorders of tendon, left shoulder: Secondary | ICD-10-CM | POA: Diagnosis not present

## 2021-06-15 DIAGNOSIS — M7522 Bicipital tendinitis, left shoulder: Secondary | ICD-10-CM | POA: Diagnosis not present

## 2021-06-15 DIAGNOSIS — M25512 Pain in left shoulder: Secondary | ICD-10-CM | POA: Insufficient documentation

## 2021-06-15 DIAGNOSIS — S46012A Strain of muscle(s) and tendon(s) of the rotator cuff of left shoulder, initial encounter: Secondary | ICD-10-CM | POA: Diagnosis not present

## 2021-06-15 MED ORDER — IOHEXOL 180 MG/ML  SOLN
20.0000 mL | Freq: Once | INTRAMUSCULAR | Status: AC | PRN
  Administered 2021-06-15: 11:00:00 15 mL

## 2021-06-15 MED ORDER — LIDOCAINE HCL (PF) 1 % IJ SOLN
10.0000 mL | Freq: Once | INTRAMUSCULAR | Status: AC
  Administered 2021-06-15: 11:00:00 5 mL
  Filled 2021-06-15: qty 10

## 2021-06-15 MED ORDER — SODIUM CHLORIDE (PF) 0.9 % IJ SOLN
20.0000 mL | INTRAMUSCULAR | Status: DC | PRN
  Administered 2021-06-15: 11:00:00 5 mL

## 2021-06-15 MED ORDER — GADOBUTROL 1 MMOL/ML IV SOLN
2.0000 mL/kg | Freq: Once | INTRAVENOUS | Status: AC | PRN
  Administered 2021-06-15: 11:00:00 0.05 mL

## 2021-07-05 ENCOUNTER — Other Ambulatory Visit: Payer: Self-pay

## 2021-07-05 DIAGNOSIS — G8929 Other chronic pain: Secondary | ICD-10-CM

## 2021-07-06 MED ORDER — HYDROCODONE-ACETAMINOPHEN 10-325 MG PO TABS: 1.0000 | ORAL_TABLET | Freq: Four times a day (QID) | ORAL | 0 refills | Status: DC | PRN

## 2021-07-13 DIAGNOSIS — M7582 Other shoulder lesions, left shoulder: Secondary | ICD-10-CM | POA: Diagnosis not present

## 2021-08-08 ENCOUNTER — Other Ambulatory Visit: Payer: Self-pay

## 2021-08-08 DIAGNOSIS — M25551 Pain in right hip: Secondary | ICD-10-CM

## 2021-08-08 DIAGNOSIS — G8929 Other chronic pain: Secondary | ICD-10-CM

## 2021-08-09 MED ORDER — HYDROCODONE-ACETAMINOPHEN 10-325 MG PO TABS: 1.0000 | ORAL_TABLET | Freq: Four times a day (QID) | ORAL | 0 refills | Status: DC | PRN

## 2021-09-05 ENCOUNTER — Other Ambulatory Visit: Payer: Self-pay

## 2021-09-05 DIAGNOSIS — G8929 Other chronic pain: Secondary | ICD-10-CM

## 2021-09-05 MED ORDER — HYDROCODONE-ACETAMINOPHEN 10-325 MG PO TABS: 1.0000 | ORAL_TABLET | Freq: Four times a day (QID) | ORAL | 0 refills | Status: DC | PRN

## 2021-09-10 ENCOUNTER — Telehealth: Payer: Self-pay

## 2021-09-10 ENCOUNTER — Other Ambulatory Visit (HOSPITAL_COMMUNITY): Payer: Self-pay

## 2021-09-10 NOTE — Telephone Encounter (Signed)
Received fax from pharmacy, PA needed on hydrocodone-acetaminophen.  Clinical questions submitted via Cover My Meds.  Waiting on response, could take up to 72 hours. ? ?Cover My Meds info: ?Key: [id number] ? Veronda Prude, RN ? ?

## 2021-09-10 NOTE — Telephone Encounter (Signed)
PA approved from 09/10/21-03/09/22. ? ?Pharmacy called with approval.  ? ?Attempted to call patient to provide with update. No answer and VM for provided number gave a store information. If patient calls back, please let patient know that PA was approved.  ? ?Veronda Prude, RN ? ?

## 2021-10-08 ENCOUNTER — Other Ambulatory Visit: Payer: Self-pay

## 2021-10-08 DIAGNOSIS — G8929 Other chronic pain: Secondary | ICD-10-CM

## 2021-10-08 DIAGNOSIS — M7582 Other shoulder lesions, left shoulder: Secondary | ICD-10-CM | POA: Diagnosis not present

## 2021-10-08 MED ORDER — HYDROCODONE-ACETAMINOPHEN 10-325 MG PO TABS: 1.0000 | ORAL_TABLET | Freq: Four times a day (QID) | ORAL | 0 refills | Status: DC | PRN

## 2021-10-20 ENCOUNTER — Encounter: Payer: Self-pay | Admitting: Family Medicine

## 2021-11-07 ENCOUNTER — Other Ambulatory Visit: Payer: Self-pay

## 2021-11-07 DIAGNOSIS — G8929 Other chronic pain: Secondary | ICD-10-CM

## 2021-11-07 MED ORDER — HYDROCODONE-ACETAMINOPHEN 10-325 MG PO TABS: 1.0000 | ORAL_TABLET | Freq: Four times a day (QID) | ORAL | 0 refills | Status: DC | PRN

## 2021-11-27 ENCOUNTER — Encounter: Payer: Self-pay | Admitting: *Deleted

## 2021-12-06 ENCOUNTER — Other Ambulatory Visit: Payer: Self-pay

## 2021-12-06 DIAGNOSIS — G8929 Other chronic pain: Secondary | ICD-10-CM

## 2021-12-06 MED ORDER — HYDROCODONE-ACETAMINOPHEN 10-325 MG PO TABS: 1.0000 | ORAL_TABLET | Freq: Four times a day (QID) | ORAL | 0 refills | Status: DC | PRN

## 2021-12-19 ENCOUNTER — Encounter: Payer: Self-pay | Admitting: Family Medicine

## 2021-12-19 ENCOUNTER — Ambulatory Visit (INDEPENDENT_AMBULATORY_CARE_PROVIDER_SITE_OTHER): Payer: Medicaid Other | Admitting: Family Medicine

## 2021-12-19 VITALS — BP 139/90 | HR 70 | Resp 18 | Ht 65.0 in | Wt 189.1 lb

## 2021-12-19 DIAGNOSIS — L658 Other specified nonscarring hair loss: Secondary | ICD-10-CM | POA: Diagnosis not present

## 2021-12-19 DIAGNOSIS — M25551 Pain in right hip: Secondary | ICD-10-CM

## 2021-12-19 DIAGNOSIS — G8929 Other chronic pain: Secondary | ICD-10-CM

## 2021-12-19 DIAGNOSIS — L738 Other specified follicular disorders: Secondary | ICD-10-CM

## 2021-12-19 NOTE — Patient Instructions (Addendum)
It was a pleasure to see you today!  The spots under your eyes are due to sebaceous hyperplasia, or the growth of normal skin cells. You can try salicylic acid over the counter at night to see if this helps. Avoid your eye. If these are bothersome and don't get better, the next step is referral to dermatology For your hair thinning, this is from pressure, I recommend you take pressure off the hair to let it heal I have placed a referral for hip therapy. You should receive a phone call in 1-2 weeks to schedule this appointment. If for any reason you do not receive a phone call or need more help scheduling this appointment, please call our office at [phone].    Be Well,  Dr. Leary Roca

## 2021-12-19 NOTE — Progress Notes (Signed)
    SUBJECTIVE:   CHIEF COMPLAINT / HPI:   Bumps under eyes: Patient has had small flesh-colored bumps underneath bilateral eyes for several years and is concerned as to what they might be.  She finds them cosmetically unappealing.  Hair loss: Patient frequently has her hair done in braids, today she has box braids.  She has noticed the last time she had her braids done that she had some hair loss on the vertex of her scalp in between her braids.  She is concerned that the hair dye she used in the last few months could have been responsible.  Chronic R hip pain: Patient has a history of SLE and had an episode of avascular necrosis of the hip several years ago.  She has had chronic pain since then she is previously followed with orthopedics and they recommended hip replacement.  Patient is hesitant for hip replacement as she is only in her 30s and realizes that she may need another hip replacement since they only tend to last about 20 years.  She is starting physical therapy for her shoulder at [facility] in [place] and is interested in pursuing physical therapy for her hip as well.  PERTINENT  PMH / PSH: SLE, history of avascular necrosis of the right hip  OBJECTIVE:   BP 139/90   Pulse 70   Resp 18   Ht 5\' 5"  (1.651 m)   Wt 189 lb 2 oz (85.8 kg)   LMP 11/21/2021   SpO2 100%   BMI 31.47 kg/m   Nursing note and vitals reviewed GEN: Age-appropriate, AA W, resting comfortably in chair, NAD, class I obesity HEENT: NCAT. PERRLA. Sclera without injection or icterus. MMM.  1 to 2 mm flesh-colored papules bilaterally underneath patient's eyes.  Areas of about 1 cm of hair loss x2 on vertex of scalp. Right hip: No obvious rash, erythema, ecchymosis, or edema. ROM limited by pain on internal rotation, otherwise full in all other directions; Strength 5/5 in /ER/Flex/Ext/Abd/Add, 4/5 IR. Limp on right leg.  Greater trochanter without tenderness to palpation. Neuro: AOx3  Ext: no  edema Psych: Pleasant and appropriate      ASSESSMENT/PLAN:   Sebaceous gland hyperplasia of face Patient has prominent stridor patient's glans on her face underneath her eyes bilaterally.  She can try over-the-counter salicylic acid to see if this reduces the size.  Otherwise recommend referral to dermatology if they are sufficiently bothersome to patient.  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  Recommend she remove the braids from her hair and allow care time to heal without as much pressure.  Chronic right hip pain Patient with chronic right hip pain due to SLE and avascular necrosis of the hip.  She would like to try physical therapy again.  This is very reasonable.  Referral sent.     11/23/2021, MD [facility]

## 2021-12-20 DIAGNOSIS — L658 Other specified nonscarring hair loss: Secondary | ICD-10-CM | POA: Insufficient documentation

## 2021-12-20 DIAGNOSIS — L738 Other specified follicular disorders: Secondary | ICD-10-CM | POA: Insufficient documentation

## 2021-12-20 NOTE — Assessment & Plan Note (Signed)
Patient frequently has lots of traction on her hair due to braided hair style.  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  Recommend she remove the braids from her hair and allow care time to heal without as much pressure.

## 2021-12-20 NOTE — Assessment & Plan Note (Signed)
Patient with chronic right hip pain due to SLE and avascular necrosis of the hip.  She would like to try physical therapy again.  This is very reasonable.  Referral sent.

## 2021-12-20 NOTE — Assessment & Plan Note (Signed)
Patient has prominent stridor patient's glans on her face underneath her eyes bilaterally.  She can try over-the-counter salicylic acid to see if this reduces the size.  Otherwise recommend referral to dermatology if they are sufficiently bothersome to patient.

## 2022-01-04 ENCOUNTER — Other Ambulatory Visit: Payer: Self-pay

## 2022-01-04 DIAGNOSIS — G8929 Other chronic pain: Secondary | ICD-10-CM

## 2022-01-04 MED ORDER — HYDROCODONE-ACETAMINOPHEN 10-325 MG PO TABS: 1.0000 | ORAL_TABLET | Freq: Four times a day (QID) | ORAL | 0 refills | Status: DC | PRN

## 2022-01-31 ENCOUNTER — Other Ambulatory Visit: Payer: Self-pay

## 2022-01-31 DIAGNOSIS — G8929 Other chronic pain: Secondary | ICD-10-CM

## 2022-02-01 MED ORDER — HYDROCODONE-ACETAMINOPHEN 10-325 MG PO TABS: 1.0000 | ORAL_TABLET | Freq: Four times a day (QID) | ORAL | 0 refills | Status: DC | PRN

## 2022-02-05 DIAGNOSIS — F411 Generalized anxiety disorder: Secondary | ICD-10-CM | POA: Diagnosis not present

## 2022-02-12 ENCOUNTER — Telehealth: Payer: Medicaid Other | Admitting: Physician Assistant

## 2022-02-12 DIAGNOSIS — K529 Noninfective gastroenteritis and colitis, unspecified: Secondary | ICD-10-CM

## 2022-02-12 NOTE — Progress Notes (Signed)
Because of how long symptoms have been going on and not resolving, I feel your condition warrants further evaluation and I recommend that you be seen in a face to face visit.   NOTE: There will be NO CHARGE for this eVisit   If you are having a true medical emergency please call 911.      For an urgent face to face visit, [place] has seven urgent care centers for your convenience:     [facility] Health Urgent Care Center at [facility] Directions [phone] [address] [place], [place] [address]    [facility] Health Urgent Care Center [facility]) Get Driving Directions [phone] [address] [place], [place] [address]  [facility] Health Urgent Care Center [facility]) Get Driving Directions [phone] [address] [place],  [place]  [address]  [facility] Health Urgent Care Center [facility] at [employer] Directions  [phone] [address] [place],  [place] [address]   [facility] Health Urgent Care at [facility] Get Driving Directions [phone] [address] [place], [place] [address]   [facility] Health Urgent Care at [facility] Get Driving Directions  [phone] [address] [place], [place] [address]   [facility] Health Urgent Care at [facility] Directions [phone] [address], [address] [place], [place] [address]  Your MyChart E-visit questionnaire answers were reviewed by a board certified advanced clinical practitioner to complete your personal care plan based on your specific symptoms.  Thank you for using e-Visits.

## 2022-02-19 ENCOUNTER — Telehealth: Payer: Medicaid Other | Admitting: Physician Assistant

## 2022-02-19 DIAGNOSIS — T63461A Toxic effect of venom of wasps, accidental (unintentional), initial encounter: Secondary | ICD-10-CM

## 2022-02-19 MED ORDER — PREDNISONE 20 MG PO TABS: 40.0000 mg | ORAL_TABLET | Freq: Every day | ORAL | 0 refills | Status: DC

## 2022-02-19 NOTE — Progress Notes (Signed)
E-Visit for Insect Sting  Thank you for describing the insect sting for [place].  Here is how we plan to help!  A sting that we will treat with a short course of prednisone.  The 2 greatest risks from insect stings are allergic reaction, which can be fatal in some people and infection, which is more common and less serious.  Bees, wasps, yellow jackets, and hornets belong to a class of insects called Hymenoptera.  Most insect stings cause only minor discomfort.  Stings can happen anywhere on the body and can be painful.  Most stings are from honey bees or yellow jackets.  Fire ants can sting multiple times.  The sites of the stings are more likely to become infected.    Based on your information I have:, Provided a home care guide for insect stings and instructions on when to call for help., and I have sent in prednisone 40 mg by mouth daily for 5 days to the pharmacy you selected.  Please make sure that you selected a pharmacy that is open now.  What can be used to prevent Insect Stings?  Insect repellant with at least 20% DEET.  Wearing long pants and shirts with socks and shoes.  Wear dark or drab-colored clothes rather than bright colors.  Avoid using perfumes and hair sprays; these attract insects.  HOME CARE ADVICE:  1. Stinger removal: The stinger looks like a tiny black dot in the sting. Use a fingernail, credit card edge, or knife-edge to scrape it off.  Don't pull it out because it squeezes out more venom. If the stinger is below the skin surface, leave it alone.  It will be shed with normal skin healing. 2. Use cold compresses to the area of the sting for 10-20 minutes.  You may repeat this as needed to relieve symptoms of pain and swelling. 3.  For pain relief, take acetominophen 650 mg 4-6 hours as needed or ibuprofen 400 mg every 6-8 hours as needed or naproxen 250-500 mg every 12 hours as needed. 4.  You can also use hydrocortisone cream 0.5% or 1% up to 4 times daily as  needed for itching. 5.  If the sting becomes very itchy, take Benadryl 25-50 mg, follow directions on box. 6.  Wash the area 2-3 times daily with antibacterial soap and warm water. 7. Call your Doctor if: Fever, a severe headache, or rash occur in the next 2 weeks. Sting area begins to look infected. Redness and swelling worsens after home treatment. Your current symptoms become worse.    MAKE SURE YOU:  Understand these instructions. Will watch your condition. Will get help right away if you are not doing well or get worse.  Thank you for choosing an e-visit.  Your e-visit answers were reviewed by a board certified advanced clinical practitioner to complete your personal care plan. Depending upon the condition, your plan could have included both over the counter or prescription medications.  Please review your pharmacy choice. Make sure the pharmacy is open so you can pick up prescription now. If there is a problem, you may contact your provider through [employer] and have the prescription routed to another pharmacy.  Your safety is important to [place]. If you have drug allergies check your prescription carefully.   For the next 24 hours you can use MyChart to ask questions about today's visit, request a non-urgent call back, or ask for a work or school excuse. You will get an email in the next  two days asking about your experience. I hope that your e-visit has been valuable and will speed your recovery.

## 2022-02-19 NOTE — Progress Notes (Signed)
I have spent 5 minutes in review of e-visit questionnaire, review and updating patient chart, medical decision making and response to patient.   Emilianna Barlowe Cody Melaine Mcphee, PA-C    

## 2022-02-28 ENCOUNTER — Telehealth: Payer: Self-pay | Admitting: Student

## 2022-02-28 NOTE — Telephone Encounter (Signed)
..   Medicaid Managed Care   Unsuccessful Outreach Note  02/28/2022 Name: Angela Walls MRN: [id number] DOB: 03/27/1981  Referred by: Levin Erp, MD Reason for referral : High Risk Managed Medicaid (I called the patient today to get her scheduled with the MM Team. I left my name and number on her VM.)   An unsuccessful telephone outreach was attempted today. The patient was referred to the case management team for assistance with care management and care coordination.   Follow Up Plan: The care management team will reach out to the patient again over the next 14 days.    Weston Settle Care Guide, High Risk Medicaid Managed Care Embedded Care Coordination [facility]  [facility]

## 2022-03-04 ENCOUNTER — Other Ambulatory Visit: Payer: Self-pay

## 2022-03-04 NOTE — Patient Instructions (Signed)
Visit Information  Ms. Angela Walls was given information about Medicaid Managed Care team care coordination services as a part of their Healthy [facility] Medicaid benefit. Angela Walls verbally consented to engagement with the [facility] Managed Care team.   If you are experiencing a medical emergency, please call 911 or report to your local emergency department or urgent care.   If you have a non-emergency medical problem during routine business hours, please contact your provider's office and ask to speak with a nurse.   For questions related to your Healthy [facility] health plan, please call: [phone] or visit the homepage here: [email or web address]  If you would like to schedule transportation through your Healthy [facility] plan, please call the following number at least 2 days in advance of your appointment: [phone]  For information about your ride after you set it up, call Ride Assist at [phone]. Use this number to activate a Will Call pickup, or if your transportation is late for a scheduled pickup. Use this number, too, if you need to make a change or cancel a previously scheduled reservation.  If you need transportation services right away, call [phone]. The after-hours call center is staffed 24 hours to handle ride assistance and urgent reservation requests (including discharges) 365 days a year. Urgent trips include sick visits, hospital discharge requests and life-sustaining treatment.  Call the [facility] Line at [phone], at any time, 24 hours a day, 7 days a week. If you are in danger or need immediate medical attention call 911.  If you would like help to quit smoking, call 1-800-QUIT-NOW (209-632-4983) OR Espaol: 1-855-Djelo-Ya (9-833-825-0539) o para ms informacin haga clic aqu or Text READY to 767-341 to register via text  Angela Walls - following are the goals we discussed in your visit today:   Goals  Addressed   None      Social Worker will follow up in 14 days .   Angela Walls, BSW, [place] [facility]  [place]  High Risk Managed Medicaid Team  [phone]   Following is a copy of your plan of care:  There are no care plans that you recently modified to display for this patient.

## 2022-03-04 NOTE — Patient Outreach (Signed)
Medicaid Managed Care Social Work Note  03/04/2022 Name:  Angela Walls MRN:  [id number] DOB:  04/25/1981  Angela Walls is an 41 y.o. year old female who is a primary patient of Levin Erp, MD.  The Medicaid Managed Care Coordination team was consulted for assistance with:  Community Resources   Ms. Bauer was given information about Medicaid Managed Care Coordination team services today. [name] Patient agreed to services and verbal consent obtained.  Engaged with patient  for by telephone forinitial visit in response to referral for case management and/or care coordination services.   Assessments/Interventions:  Review of past medical history, allergies, medications, health status, including review of consultants reports, laboratory and other test data, was performed as part of comprehensive evaluation and provision of chronic care management services.  SDOH: (Social Determinant of Health) assessments and interventions performed: BSW completed a telephone outreach with patient. She stated she is currently out of work due to an injury and is trying to find something else. Patient stated she would go online to look for employment. Patient stated she needs assistance with her rent. She has already went through [employer] and stated they were rude and she would not want assistance from them. BSW will email patient rent resources to [email or web address].  Advanced Directives Status:  Not addressed in this encounter.  Care Plan                 Allergies  Allergen Reactions   Cinnamon Swelling   Penicillins Hives and Swelling    Has patient had a PCN reaction causing immediate rash, facial/tongue/throat swelling, SOB or lightheadedness with hypotension: Yes Has patient had a PCN reaction causing severe rash involving mucus membranes or skin necrosis: No Has patient had a PCN reaction that required hospitalization: No Has patient had a PCN reaction occurring within the  last 10 years: No If all of the above answers are "NO", then may proceed with Cephalosporin use.     Medications Reviewed Today     Reviewed by Noel Journey (Physician Assistant Certified) on 02/19/22 at 1336  Med List Status: <None>   Medication Order Taking? Sig Documenting Provider Last Dose Status Informant  amLODipine (NORVASC) 5 MG tablet [id number] No TAKE 1 TABLET BY MOUTH EVERYDAY AT BEDTIME Shirlean Mylar, MD Taking Active   diclofenac Sodium (VOLTAREN) 1 % GEL [id number] No Apply 2 g topically 4 (four) times daily. Carney Living, MD Taking Active   EPINEPHrine 0.3 mg/0.3 mL IJ SOAJ injection [id number] No Inject 0.3 mLs (0.3 mg total) into the muscle as needed for anaphylaxis.  Patient not taking: No sig reported   Caro Laroche, DO Not Taking Active   HYDROcodone-acetaminophen (NORCO) 10-325 MG tablet [id number]  Take 1 tablet by mouth every 6 (six) hours as needed for severe pain. 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   Follow up:  Patient agrees to Care Plan and Follow-up.  Plan: The Managed Medicaid care management team will reach out to the patient again over the next 14 days.  Date/time of next scheduled Social Work care management/care coordination outreach:  03/22/22  Gus Puma, Kenard Gower, [facility]  [facility]  High Risk Managed Medicaid Team  [phone]

## 2022-03-06 ENCOUNTER — Other Ambulatory Visit: Payer: Self-pay

## 2022-03-06 DIAGNOSIS — G8929 Other chronic pain: Secondary | ICD-10-CM

## 2022-03-07 ENCOUNTER — Other Ambulatory Visit (HOSPITAL_COMMUNITY): Payer: Self-pay

## 2022-03-07 MED ORDER — HYDROCODONE-ACETAMINOPHEN 10-325 MG PO TABS: 1.0000 | ORAL_TABLET | Freq: Four times a day (QID) | ORAL | 0 refills | Status: DC | PRN

## 2022-03-08 ENCOUNTER — Encounter: Payer: Self-pay | Admitting: Student

## 2022-03-12 ENCOUNTER — Other Ambulatory Visit: Payer: Self-pay | Admitting: Student

## 2022-03-12 NOTE — Telephone Encounter (Signed)
Patient calls nurse line in regards to letter.   Patient reports she needs a letter stating [facility] participates in her care for Lupus and Vascular Necrosis. Patient reports she needs this ASAP for work.   Patient advised to make an apt per PCP request. However, patient stated she doesn't "have time left"  to make an apt, she reports she needs this letter now. Patient did state she will make an apt for next month for 3 month check up with PCP.   Will forward to PCP.

## 2022-03-22 ENCOUNTER — Other Ambulatory Visit: Payer: Self-pay

## 2022-03-22 NOTE — Patient Instructions (Signed)
Visit Information  Ms. Angela Walls  - as a part of your Medicaid benefit, you are eligible for care management and care coordination services at no cost or copay. I was unable to reach you by phone today but would be happy to help you with your health related needs. Please feel free to call me @ [phone]).   A member of the Managed Medicaid care management team will reach out to you again over the next 7 days.  Mickel Fuchs, BSW, [place] Managed Medicaid Team  [phone]

## 2022-03-22 NOTE — Patient Outreach (Signed)
Care Coordination  03/22/2022  Angela Walls 05-Jun-1981 [id number]    Medicaid Managed Care   Unsuccessful Outreach Note  03/22/2022 Name: Angela Walls MRN: [id number] DOB: 04-01-1981  Referred by: Gerrit Heck, MD Reason for referral : High Risk Managed Medicaid   An unsuccessful telephone outreach was attempted today. The patient was referred to the case management team for assistance with care management and care coordination.   Follow Up Plan: The care management team will reach out to the patient again over the next 30 days.   Mickel Fuchs, BSW, [place] Managed Medicaid Team  [phone]

## 2022-04-02 ENCOUNTER — Ambulatory Visit: Payer: Medicaid Other | Admitting: Student

## 2022-04-02 ENCOUNTER — Encounter: Payer: Self-pay | Admitting: Student

## 2022-04-02 VITALS — BP 136/76 | HR 82 | Ht 65.0 in | Wt 189.0 lb

## 2022-04-02 DIAGNOSIS — M25551 Pain in right hip: Secondary | ICD-10-CM | POA: Diagnosis not present

## 2022-04-02 DIAGNOSIS — M329 Systemic lupus erythematosus, unspecified: Secondary | ICD-10-CM

## 2022-04-02 DIAGNOSIS — G8929 Other chronic pain: Secondary | ICD-10-CM

## 2022-04-02 DIAGNOSIS — M87051 Idiopathic aseptic necrosis of right femur: Secondary | ICD-10-CM

## 2022-04-02 DIAGNOSIS — I10 Essential (primary) hypertension: Secondary | ICD-10-CM | POA: Diagnosis not present

## 2022-04-02 MED ORDER — SENNA 8.6 MG PO TABS: 1.0000 | ORAL_TABLET | Freq: Every day | ORAL | 0 refills | Status: DC

## 2022-04-02 MED ORDER — POLYETHYLENE GLYCOL 3350 17 GM/SCOOP PO POWD: 17.0000 g | Freq: Every day | ORAL | 1 refills | Status: DC

## 2022-04-02 NOTE — Assessment & Plan Note (Signed)
Patient has not followed with rheumatology in a while.  I discussed that it would be in her best care to establish care with rheumatologist and she did specify a certain 1 that she wanted to try [facility].   -I placed an external referral to rheumatology with Dr. Barbaraann Barthel information -Currently continuing Norco 10-3 25 4  times daily I have added on a bowel regimen for her of MiraLAX and senna -PT referral placed for hip I discussed that surgical intervention should be something she should consider as well

## 2022-04-02 NOTE — Patient Instructions (Addendum)
It was great to see you! Thank you for allowing me to participate in your care!   Our plans for today:  - I have placed rheumatology for your preferred provider and PT referral - consider hip replacement as an option as well to help - I have sent in miralax and senna to take for any constipation from your pain meds - Monitor your BP at home and bring in log at next visit  Take care and seek immediate care sooner if you develop any concerns.  Gerrit Heck, MD

## 2022-04-02 NOTE — Assessment & Plan Note (Signed)
She says that she is eating better and not doing takeout as much.  She is not taking her amlodipine currently.  Asymptomatic and no significantly high blood pressure are in office today. -Bring in blood pressure log at next visit I discussed taking a.m. and p.m. pressures and bring this log in

## 2022-04-02 NOTE — Progress Notes (Signed)
    SUBJECTIVE:   CHIEF COMPLAINT / HPI: Meet PCP  Lupus  R Hip Avascular Necrosis Stopped going to rheumatologist a while ago she did not have a great experience at that office.  She is currently not taking any prednisone or DMARDs.  She was previously on methotrexate and prednisone.  She feels okay except for her right hip which does have signs of avascular necrosis which we have been monitoring.  She is currently taking around four 10-325 Norco every day due to the pain.  He would like to come down on this dosage as she does not like to be on medications in general.  She says that she has seen an orthopedist before for potential hip replacement however she would like to try physical therapy before getting surgical interventions.  Would like to try PT first before surgery but she says she did not receive any call from the referral clinic about specifically her hip.  Has been doing PT for her shoulder at the [facility] which was injured from a work injury previously.  HTN Not taking amlodipine. Does not have any chest pan or Hartness of breath.  She does not take her blood pressure at home but she says that in the past she has had whitecoat hypertension.  PERTINENT  PMH / PSH: lupus  OBJECTIVE:   BP 136/76   Pulse 82   Ht 5\' 5"  (1.651 m)   Wt 189 lb (85.7 kg)   LMP 04/01/2022   SpO2 100%   BMI 31.45 kg/m   General: Well appearing, NAD, awake, alert, responsive to questions Head: Normocephalic atraumatic CV: Regular rate and rhythm no murmurs rubs or gallops Respiratory: Clear to ausculation bilaterally, no wheezes rales or crackles, chest rises symmetrically,  no increased work of breathing Extremities: Gait is abnormal with most of pressure on left leg when she is walking and limp present ASSESSMENT/PLAN:   Avascular necrosis of femur head, right ([place]) Patient has not followed with rheumatology in a while.  I discussed that it would be in her best care to establish care with  rheumatologist and she did specify a certain 1 that she wanted to try [facility].   -I placed an external referral to rheumatology with Dr. Barbaraann Barthel information -Currently continuing Norco 10-3 25 4  times daily I have added on a bowel regimen for her of MiraLAX and senna -PT referral placed for hip I discussed that surgical intervention should be something she should consider as well  HYPERTENSION, BENIGN SYSTEMIC She says that she is eating better and not doing takeout as much.  She is not taking her amlodipine currently.  Asymptomatic and no significantly high blood pressure are in office today. -Bring in blood pressure log at next visit I discussed taking a.m. and p.m. pressures and bring this log in   Gerrit Heck, MD [place]

## 2022-04-05 ENCOUNTER — Encounter: Payer: Self-pay | Admitting: *Deleted

## 2022-04-05 ENCOUNTER — Other Ambulatory Visit: Payer: Self-pay

## 2022-04-05 DIAGNOSIS — G8929 Other chronic pain: Secondary | ICD-10-CM

## 2022-04-07 MED ORDER — HYDROCODONE-ACETAMINOPHEN 10-325 MG PO TABS: 1.0000 | ORAL_TABLET | Freq: Four times a day (QID) | ORAL | 0 refills | Status: DC | PRN

## 2022-04-08 ENCOUNTER — Telehealth: Payer: Self-pay

## 2022-04-08 ENCOUNTER — Telehealth: Payer: Self-pay | Admitting: Family Medicine

## 2022-04-08 DIAGNOSIS — G8929 Other chronic pain: Secondary | ICD-10-CM

## 2022-04-08 MED ORDER — HYDROCODONE-ACETAMINOPHEN 10-325 MG PO TABS: 1.0000 | ORAL_TABLET | Freq: Four times a day (QID) | ORAL | 0 refills | Status: DC | PRN

## 2022-04-08 NOTE — Telephone Encounter (Signed)
After-hours/emergency line call  Patient called after-hours line reports that she went to pick up her hydrocodone prescription but it was not available.  [facility] [place]  She was asking where the prescription was sent.  I reviewed the chart and informed her that it looks like the prescription was sent to [facility].

## 2022-04-08 NOTE — Telephone Encounter (Signed)
Medication approved through 10/05/2022.  I called [facility] and they have medication in stock.   Please resend.   [facility]

## 2022-04-08 NOTE — Telephone Encounter (Signed)
Patient calls nurse line regarding Hydrocodone prescription.   This was called in by PCP yesterday, however she reports she has not heard from the pharmacy.   I called the pharmacy. The prescription needs a PA. I have submitted this. Key: [facility].  The pharmacy has no stock of prescription at this time. [facility] the [facility] are [facility].   I attempted to call patient to discuss, however o answer or VM option.

## 2022-04-09 NOTE — Telephone Encounter (Signed)
See after hours telephone note.

## 2022-04-10 ENCOUNTER — Other Ambulatory Visit (HOSPITAL_COMMUNITY): Payer: Self-pay

## 2022-04-21 ENCOUNTER — Telehealth: Payer: Medicaid Other | Admitting: Family

## 2022-04-21 DIAGNOSIS — R399 Unspecified symptoms and signs involving the genitourinary system: Secondary | ICD-10-CM

## 2022-04-21 MED ORDER — NITROFURANTOIN MONOHYD MACRO 100 MG PO CAPS: 100.0000 mg | ORAL_CAPSULE | Freq: Two times a day (BID) | ORAL | 0 refills | Status: DC

## 2022-04-21 NOTE — Progress Notes (Signed)

## 2022-05-08 ENCOUNTER — Encounter: Payer: Self-pay | Admitting: Student

## 2022-05-08 ENCOUNTER — Other Ambulatory Visit: Payer: Self-pay

## 2022-05-08 DIAGNOSIS — G8929 Other chronic pain: Secondary | ICD-10-CM

## 2022-05-08 MED ORDER — HYDROCODONE-ACETAMINOPHEN 10-325 MG PO TABS: 1.0000 | ORAL_TABLET | Freq: Four times a day (QID) | ORAL | 0 refills | Status: DC | PRN

## 2022-05-08 NOTE — Telephone Encounter (Signed)
[facility] does not have stock of medication.  [facility] does report adequate stock.  Will forward to PCP to send to [facility]. I have this pended for you.

## 2022-05-08 NOTE — Telephone Encounter (Signed)
Patient calls nurse line requesting a refill on Norco 10mg -325mg .   [facility] [place] I called them and they do not have stock of medication. I also called (last refill) and they do not have stock either.   Spoke with patient. I will attempt to call [place] and [facility] for stock.   If no stock, we will need to send in an alternative or perhaps a different dosage.

## 2022-05-09 MED ORDER — HYDROCODONE-ACETAMINOPHEN 10-325 MG PO TABS: 1.0000 | ORAL_TABLET | Freq: Four times a day (QID) | ORAL | 0 refills | Status: DC | PRN

## 2022-05-09 NOTE — Addendum Note (Signed)
Addended by: Veronda Prude on: 05/09/2022 10:51 AM   Modules accepted: Orders

## 2022-05-09 NOTE — Telephone Encounter (Signed)
Patient returns call to nurse line regarding medication. She states that she has still not been able to get medication due to stock issues. [facility]. They report that they misunderstood our request yesterday and that they actually do not have stock of hydrocodone-acetaminophen 10/325 mg.   Canceled prescription at [facility].   [facility] on [address] has prescription in stock.   Please send refill to this location.   Veronda Prude, RN

## 2022-05-29 ENCOUNTER — Encounter: Payer: Self-pay | Admitting: Student

## 2022-06-06 ENCOUNTER — Other Ambulatory Visit: Payer: Self-pay

## 2022-06-06 DIAGNOSIS — G8929 Other chronic pain: Secondary | ICD-10-CM

## 2022-06-06 MED ORDER — HYDROCODONE-ACETAMINOPHEN 10-325 MG PO TABS: 1.0000 | ORAL_TABLET | Freq: Four times a day (QID) | ORAL | 0 refills | Status: DC | PRN

## 2022-06-07 ENCOUNTER — Telehealth: Payer: Self-pay

## 2022-06-07 NOTE — Telephone Encounter (Signed)
Spoke with patient . Made appt for 12/27 at 9:30 for follow up on right hip pain. Aquilla Solian, CMA

## 2022-06-07 NOTE — Telephone Encounter (Signed)
-----   Message from Levin Erp, MD sent at 06/06/2022  2:01 PM EST ----- Can you set patient up for follow up of hip?  Thank you!

## 2022-06-19 ENCOUNTER — Ambulatory Visit: Payer: Medicaid Other | Admitting: Student

## 2022-06-19 NOTE — Progress Notes (Deleted)
    SUBJECTIVE:   CHIEF COMPLAINT / HPI:   ***  PERTINENT  PMH / PSH: ***  OBJECTIVE:   There were no vitals taken for this visit.  ***  ASSESSMENT/PLAN:   No problem-specific Assessment & Plan notes found for this encounter.     Deaven Barron, MD [facility]  

## 2022-07-08 ENCOUNTER — Telehealth: Payer: Self-pay

## 2022-07-08 ENCOUNTER — Ambulatory Visit: Payer: Medicaid Other | Admitting: Student

## 2022-07-08 ENCOUNTER — Encounter: Payer: Self-pay | Admitting: Student

## 2022-07-08 VITALS — BP 149/63 | HR 71 | Ht 65.0 in | Wt 186.0 lb

## 2022-07-08 DIAGNOSIS — M25551 Pain in right hip: Secondary | ICD-10-CM

## 2022-07-08 DIAGNOSIS — G8929 Other chronic pain: Secondary | ICD-10-CM | POA: Diagnosis not present

## 2022-07-08 DIAGNOSIS — I10 Essential (primary) hypertension: Secondary | ICD-10-CM | POA: Diagnosis not present

## 2022-07-08 DIAGNOSIS — M329 Systemic lupus erythematosus, unspecified: Secondary | ICD-10-CM

## 2022-07-08 DIAGNOSIS — M87051 Idiopathic aseptic necrosis of right femur: Secondary | ICD-10-CM | POA: Diagnosis not present

## 2022-07-08 MED ORDER — AMLODIPINE BESYLATE 5 MG PO TABS: ORAL_TABLET | ORAL | 3 refills | Status: DC

## 2022-07-08 MED ORDER — HYDROCODONE-ACETAMINOPHEN 10-325 MG PO TABS: 1.0000 | ORAL_TABLET | Freq: Four times a day (QID) | ORAL | 0 refills | Status: DC | PRN

## 2022-07-08 NOTE — Assessment & Plan Note (Signed)
Patient seems to be appropriately using her narcotics.  She is now interested in getting a hip replacement.  She says that she knows the office that she went to for orthopedics and can call them but if she has any issues with that she will let us know and we can always place a referral for her. -Refilled Norco for 30-day supply

## 2022-07-08 NOTE — Patient Instructions (Signed)
It was great to see you! Thank you for allowing me to participate in your care!   Our plans for today:  -I am refilling your amlodipine and Norco -I am placing an order for another referral for rheumatology -Please let me know if you have any issues contacting her orthopedic doctor, I can always place another referral once you are ready for your hip replacement -I would like to follow-up with you in about a month for blood pressure monitoring, if it continues to be elevated I would like to talk about increasing your dose of amlodipine or do another BP monitor -Please bring you BP cuff with you to clinic next time  Take care and seek immediate care sooner if you develop any concerns.  Gerrit Heck, MD

## 2022-07-08 NOTE — Telephone Encounter (Signed)
Patient Angela Walls on nurse line in regards to her pain medication.   [facility]. She reports she got the prescription from [facility] last month.   [facility] [address] and [facility] sufficient stock.   I have [facility].

## 2022-07-08 NOTE — Assessment & Plan Note (Signed)
Rheumatologist that was referred to last visit is not covered on her plan.  She is okay with seeing any rheumatologist including once she is seen before. She is not on any steroids currently. -Ambulatory referral for rheumatology

## 2022-07-08 NOTE — Assessment & Plan Note (Signed)
Initially 164/101 and on recheck was 149/63.  She is adherent to her amlodipine daily.  She is on a low-dose currently so if still elevated we can consider increasing next time. -Continue amlodipine 5 mg, refilled today -BP follow-up in 1 month, consider increasing amlodipine at this time -BMP

## 2022-07-08 NOTE — Progress Notes (Signed)
    SUBJECTIVE:   CHIEF COMPLAINT / HPI: Hip pain follow up  Avascular necrosis  SLE Patient has SLE and here for follow up of avascular necrosis. Currently on Norco 10-325 every 6 hours as needed.  Was seen last 04/02/2022 for this and she said she wanted to try physical therapy before an orthopedics referral.  She also said that she would like to follow-up with a rheumatologist specifically but her insurance did not cover this rheumatologist.  She is okay with another referral to rheumatology and seeing anyone in [place]/anywhere that covers her insurance.  Was seen by orthopedics previously who had recommended hip replacement but patient was not ready for that. She got another referral to Dr. Len Childs in 2022 for potential hip resurfacing however her disease process at that time involved over 50% of the femoral head. Due to her young age they had recommended waiting as long as possible to proceed with total hip arthroplasty. She was told to call for intra-articular injections as needed.  She says that she is currently interested in hip replacement now.  She says that she is going to obtain some many early this year hopefully and will then reach out to orthopedics after this.  She says that her pain currently is a 6 out of 10 and she is having to take her Norco scheduled every 6 hours.  She is taking bowel regimen alongside this.  HTN Blood pressures at home have been 672C to 947S systolic. No headaches.  She says she is taking her 5 mg of amlodipine every day. Denies any headache, vision changes, shortness of breath, lower extremity swelling or chest pain. Did not get BMP last year.  PERTINENT  PMH / PSH: SLE, R hip avascular necrosis  OBJECTIVE:   BP (!) 149/63   Pulse 71   Ht 5\' 5"  (1.651 m)   Wt 186 lb (84.4 kg)   LMP 07/08/2022   SpO2 100%   BMI 30.95 kg/m   General: NAD, awake, alert, responsive to questions Head: Normocephalic atraumatic CV: Regular rate and rhythm no murmurs  rubs or gallops Respiratory: Clear to ausculation bilaterally,chest rises symmetrically,  no increased work of breathing Extremities: Moves upper and lower extremities freely, some pain upon walking on right hip, nontender to palpation of femoral head, sensation and strength intact, warm and well perfused Neuro: No focal deficits Skin: No rashes or lesions visualized   ASSESSMENT/PLAN:   Avascular necrosis of femur head, right ([place]) Patient seems to be appropriately using her narcotics.  She is now interested in getting a hip replacement.  She says that she knows the office that she went to for orthopedics and can call them but if she has any issues with that she will let us know and we can always place a referral for her. -Refilled Norco for 30-day supply  SLE Rheumatologist that was referred to last visit is not covered on her plan.  She is okay with seeing any rheumatologist including once she is seen before. She is not on any steroids currently. -Ambulatory referral for rheumatology  HYPERTENSION, BENIGN SYSTEMIC Initially 164/101 and on recheck was 149/63.  She is adherent to her amlodipine daily.  She is on a low-dose currently so if still elevated we can consider increasing next time. -Continue amlodipine 5 mg, refilled today -BP follow-up in 1 month, consider increasing amlodipine at this time -BMP     Gerrit Heck, MD [place]

## 2022-07-09 LAB — BASIC METABOLIC PANEL
BUN/Creatinine Ratio: 13 (ref 9–23)
BUN: 12 mg/dL (ref 6–24)
CO2: 21 mmol/L (ref 20–29)
Calcium: 9.2 mg/dL (ref 8.7–10.2)
Chloride: 106 mmol/L (ref 96–106)
Creatinine, Ser: 0.9 mg/dL (ref 0.57–1.00)
Glucose: 86 mg/dL (ref 70–99)
Potassium: 3.8 mmol/L (ref 3.5–5.2)
Sodium: 140 mmol/L (ref 134–144)
eGFR: 83 mL/min/{1.73_m2} (ref >59)

## 2022-07-29 ENCOUNTER — Encounter: Payer: Self-pay | Admitting: Student

## 2022-08-02 ENCOUNTER — Encounter: Payer: Self-pay | Admitting: *Deleted

## 2022-08-02 NOTE — Telephone Encounter (Signed)
Patient called and informed that forms are ready for pick up. Copy made and placed in batch scanning. Original placed at front desk for pick up.   Jleigh Striplin C Yarisa Lynam, RN  

## 2022-08-05 ENCOUNTER — Ambulatory Visit: Payer: Medicaid Other | Admitting: Student

## 2022-08-08 ENCOUNTER — Other Ambulatory Visit: Payer: Self-pay

## 2022-08-08 DIAGNOSIS — G8929 Other chronic pain: Secondary | ICD-10-CM

## 2022-08-08 NOTE — Telephone Encounter (Signed)
Please send to [facility] on [address].

## 2022-08-10 MED ORDER — HYDROCODONE-ACETAMINOPHEN 10-325 MG PO TABS: 1.0000 | ORAL_TABLET | Freq: Four times a day (QID) | ORAL | 0 refills | Status: DC | PRN

## 2022-08-19 ENCOUNTER — Ambulatory Visit: Payer: Medicaid Other | Admitting: Student

## 2022-09-05 ENCOUNTER — Other Ambulatory Visit: Payer: Self-pay

## 2022-09-05 DIAGNOSIS — G8929 Other chronic pain: Secondary | ICD-10-CM

## 2022-09-07 MED ORDER — HYDROCODONE-ACETAMINOPHEN 10-325 MG PO TABS: 1.0000 | ORAL_TABLET | Freq: Four times a day (QID) | ORAL | 0 refills | Status: DC | PRN

## 2022-09-30 ENCOUNTER — Other Ambulatory Visit: Payer: Self-pay

## 2022-09-30 MED ORDER — DICLOFENAC SODIUM 1 % EX GEL: 2.0000 g | Freq: Four times a day (QID) | CUTANEOUS | 0 refills | Status: DC

## 2022-10-07 ENCOUNTER — Other Ambulatory Visit: Payer: Self-pay

## 2022-10-07 DIAGNOSIS — G8929 Other chronic pain: Secondary | ICD-10-CM

## 2022-10-07 MED ORDER — HYDROCODONE-ACETAMINOPHEN 10-325 MG PO TABS: 1.0000 | ORAL_TABLET | Freq: Four times a day (QID) | ORAL | 0 refills | Status: DC | PRN

## 2022-10-09 ENCOUNTER — Encounter: Payer: Self-pay | Admitting: Student

## 2022-10-09 DIAGNOSIS — G8929 Other chronic pain: Secondary | ICD-10-CM

## 2022-10-10 NOTE — Telephone Encounter (Signed)
PA submitted for Norco 10-325mg .  Key: [id number]

## 2022-10-22 ENCOUNTER — Telehealth: Payer: Medicaid Other | Admitting: Physician Assistant

## 2022-10-22 DIAGNOSIS — R3989 Other symptoms and signs involving the genitourinary system: Secondary | ICD-10-CM | POA: Diagnosis not present

## 2022-10-23 MED ORDER — NITROFURANTOIN MONOHYD MACRO 100 MG PO CAPS: 100.0000 mg | ORAL_CAPSULE | Freq: Two times a day (BID) | ORAL | 0 refills | Status: DC

## 2022-10-23 NOTE — Progress Notes (Signed)

## 2022-10-23 NOTE — Progress Notes (Signed)
I have spent 5 minutes in review of e-visit questionnaire, review and updating patient chart, medical decision making and response to patient.   Angela Walls Cody Joci Dress, PA-C    

## 2022-11-08 MED ORDER — HYDROCODONE-ACETAMINOPHEN 10-325 MG PO TABS: 1.0000 | ORAL_TABLET | Freq: Four times a day (QID) | ORAL | 0 refills | Status: DC | PRN

## 2022-11-19 ENCOUNTER — Encounter: Payer: Medicaid Other | Admitting: Internal Medicine

## 2022-11-19 NOTE — Progress Notes (Deleted)
Office Visit Note  Patient: Angela Walls             Date of Birth: 1980-11-16           MRN: [id number]             PCP: Levin Erp, MD Referring: Latrelle Dodrill, MD Visit Date: 11/19/2022 Occupation: @GUAROCC @  Subjective:  No chief complaint on file.   History of Present Illness: Angela Walls is a 42 y.o. female ***     Activities of Daily Living:  Patient reports morning stiffness for *** {minute/hour:19697}.   Patient {ACTIONS;DENIES/REPORTS:21021675::"Denies"} nocturnal pain.  Difficulty dressing/grooming: {ACTIONS;DENIES/REPORTS:21021675::"Denies"} Difficulty climbing stairs: {ACTIONS;DENIES/REPORTS:21021675::"Denies"} Difficulty getting out of chair: {ACTIONS;DENIES/REPORTS:21021675::"Denies"} Difficulty using hands for taps, buttons, cutlery, and/or writing: {ACTIONS;DENIES/REPORTS:21021675::"Denies"}  No Rheumatology ROS completed.   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 On chronic narcotics for this.   Hypertension    Lupus (HCC)     Followed by Rheum. On chronic Prednisone.    Family History  Problem Relation Age of Onset   Hypertension Father    Hypertension Maternal Grandmother    Hypertension Paternal Grandmother    Stroke Neg Hx    Heart disease Neg Hx    Cancer Neg Hx    Breast cancer Neg Hx    Past Surgical History:  Procedure Laterality Date   APPENDECTOMY  2001   BREAST CYST EXCISION Left 08/31/2018   Procedure: EXCISION LEFT BREAST CYST;  Surgeon: Abigail Miyamoto, MD;  Location: [facility] OR;  Service: General;  Laterality: Left;   CESAREAN SECTION  2010   LIPOMA EXCISION Right 08/31/2018   Procedure: Excision right arm cyst;  Surgeon: Abigail Miyamoto, MD;  Location: [facility] OR;  Service: General;  Laterality: Right;   Social History   Social History Narrative   Lives with 41 year old and 41 year old.  In a relationship with the 64 year old's father.   Bachelor's in [employer].  Works at [employer] now.    Immunization History  Administered Date(s) Administered   Influenza Whole 04/28/2007   Influenza, Seasonal, Injecte, Preservative Fre 07/15/2012   PPD Test 10/21/2012   Td 06/25/1999   Tdap 12/07/2018     Objective: Vital Signs: There were no vitals taken for this visit.   Physical Exam   Musculoskeletal Exam: ***  CDAI Exam: CDAI Score: -- Patient Global: --; Provider Global: -- Swollen: --; Tender: -- Joint Exam 11/19/2022   No joint exam has been documented for this visit   There is currently no information documented on  the homunculus. Go to the Rheumatology activity and complete the homunculus joint exam.  Investigation: No additional findings.  Imaging: No results found.  Recent Labs: Lab Results  Component Value Date   WBC 5.2 08/25/2018   HGB 10.6 (L) 08/25/2018   PLT 334 08/25/2018   NA 140 07/08/2022   K 3.8 07/08/2022   CL 106 07/08/2022   CO2 21 07/08/2022   GLUCOSE 86 07/08/2022   BUN 12 07/08/2022   CREATININE 0.90 07/08/2022   BILITOT 0.3 12/16/2017    ALKPHOS 56 12/16/2017   AST 15 12/16/2017   ALT 7 12/16/2017   PROT 6.4 12/16/2017   ALBUMIN 4.1 12/16/2017   CALCIUM 9.2 07/08/2022   GFRAA 94 12/07/2018    Speciality Comments: No specialty comments available.  Procedures:  No procedures performed Allergies: Cinnamon and Penicillins   Assessment / Plan:     Visit Diagnoses: No diagnosis found.  Orders: No orders of the defined types were placed in this encounter.  No orders of the defined types were placed in this encounter.   Face-to-face time spent with patient was *** minutes. Greater than 50% of time was spent in counseling and coordination of care.  Follow-Up Instructions: No follow-ups on file.   Fuller Plan, MD  Note - This record has been created using [employer].  Chart creation errors have been sought, but may not always  have been located. Such creation errors do not reflect on  the standard of medical care.

## 2022-12-01 NOTE — Progress Notes (Unsigned)
Office Visit Note  Patient: Angela Walls             Date of Birth: 07-07-1980           MRN: [id number]             PCP: Levin Erp, MD Referring: Latrelle Dodrill, MD Visit Date: 12/02/2022 Occupation: Mail courier/cleaning  Subjective:  New Patient (Initial Visit) (Patient states her right hip is bothering her. Patient states she was told to make an appointment because she has not seen a rheumatologist in years and patient states she took herself off of the medication that she used to take. Patient states she has taken plaquenil, methotrexate, and prednisone. )   History of Present Illness: Angela Walls is a 42 y.o. female here for evaluation management with history of systemic lupus.  She was originally diagnosed in 2003 by review with symptoms of inflammatory arthritis, Raynaud's, and sounds like probably had proteinuria as well. No history of renal biopsy or any chronic renal insufficiency. Treatments have included hydroxychloroquine, methotrexate, and long-term prednisone.  Treatment course was complicated by avascular necrosis of the hip related to her lupus and prolonged steroid use.  Otherwise symptoms have pretty much gone into remission for the past 10 years although has some significant residual joint pain from this.  She has been off any maintenance treatment for years most recently taking steroids but those also off for at least the past 1 year. Chronic hip pain is her main joint issue, previously seen by orthopedics but delaying arthroplasty due to young age. On low dose norco for this and has had intraarticular injections but nothing recent.   Lupus manifestations Arthritis Raynauds Proteinuria  DMARD Hx HCQ methotrexate GC   Activities of Daily Living:  Patient reports morning stiffness for 5-10 minutes.   Patient Reports nocturnal pain.  Difficulty dressing/grooming: Denies Difficulty climbing stairs: Denies Difficulty getting out of chair:  Denies Difficulty using hands for taps, buttons, cutlery, and/or writing: Denies  Review of Systems  Constitutional:  Positive for fatigue.  HENT:  Positive for mouth dryness. Negative for mouth sores.   Eyes:  Positive for dryness.  Respiratory:  Positive for shortness of breath.   Cardiovascular:  Negative for chest pain and palpitations.  Gastrointestinal:  Negative for blood in stool, constipation and diarrhea.  Endocrine: Negative for increased urination.  Genitourinary:  Negative for involuntary urination.  Musculoskeletal:  Positive for joint pain, joint pain, myalgias, morning stiffness and myalgias. Negative for gait problem, joint swelling, muscle weakness and muscle tenderness.  Skin:  Positive for sensitivity to sunlight. Negative for color change, rash and hair loss.  Allergic/Immunologic: Negative for susceptible to infections.  Neurological:  Negative for dizziness and headaches.  Hematological:  Negative for swollen glands.  Psychiatric/Behavioral:  Negative for depressed mood and sleep disturbance. The patient is nervous/anxious.     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   Chronic headaches 02/19/2021   Atypical squamous cell changes of undetermined significance (ASCUS) on vaginal cytology 11/30/2020   Encounter for long-term opiate analgesic use 05/10/2020   Low back pain 02/23/2020   Hives 01/19/2019   Cyst of left breast, fibroadenoma 06/23/2017   Hair loss 06/23/2017   Greater trochanteric bursitis of right hip 08/15/2016   Aseptic necrosis of head and neck of femur, right 05/23/2015   Routine screening for STI (sexually transmitted  infection) 07/29/2014   Heart murmur 11/15/2013   Chronic right hip pain 02/06/2009   Avascular necrosis of femur head, right (HCC) 08/28/2006   HYPERTENSION, BENIGN SYSTEMIC 08/21/2006   SLE 08/21/2006   Proteinuria  08/21/2006    Past Medical History:  Diagnosis Date   Anemia    Avascular necrosis of femoral head (HCC)    Bilateral. On chronic narcotics for this.   Hypertension    Lupus (HCC)    Followed by Rheum. On chronic Prednisone.    Family History  Problem Relation Age of Onset   Hypertension Father    Hypertension Maternal Grandmother    Hypertension Paternal Grandmother    Stroke Neg Hx    Heart disease Neg Hx    Cancer Neg Hx    Breast cancer Neg Hx    Past Surgical History:  Procedure Laterality Date   APPENDECTOMY  2001   BREAST CYST EXCISION Left 08/31/2018   Procedure: EXCISION LEFT BREAST CYST;  Surgeon: Abigail Miyamoto, MD;  Location: [facility] OR;  Service: General;  Laterality: Left;   CESAREAN SECTION  2010   LIPOMA EXCISION Right 08/31/2018   Procedure: Excision right arm cyst;  Surgeon: Abigail Miyamoto, MD;  Location: [facility];  Service: General;  Laterality: Right;   Social History   Social History Narrative   Lives with 41 year old and 41 year old.  In a relationship with the 79 year old's father.   Bachelor's in [employer].  Works at [employer] now.    Immunization History  Administered Date(s) Administered   Influenza Whole 04/28/2007   Influenza, Seasonal, Injecte, Preservative Fre 07/15/2012   PPD Test 10/21/2012   Td 06/25/1999   Tdap 12/07/2018     Objective: Vital Signs: BP (!) 163/101 (BP Location: Right Arm, Patient Position: Sitting, Cuff Size: Normal)   Pulse 65   Resp 16   Ht 5\' 5"  (1.651 m)   Wt 190 lb (86.2 kg)   LMP 11/25/2022   BMI 31.62 kg/m    Physical Exam HENT:     Nose: Nose normal.     Mouth/Throat:     Mouth: Mucous membranes are moist.     Pharynx: Oropharynx is clear.  Eyes:     Conjunctiva/sclera: Conjunctivae normal.  Cardiovascular:     Rate and Rhythm: Normal rate and regular rhythm.  Pulmonary:     Effort: Pulmonary effort is normal.     Breath sounds: Normal breath sounds.  Musculoskeletal:     Right lower leg: No  edema.     Left lower leg: No edema.  Lymphadenopathy:     Cervical: No cervical adenopathy.  Skin:    General: Skin is warm and dry.     Findings: No rash.  Neurological:     Mental Status: She is alert.  Psychiatric:        Mood and Affect: Mood normal.      Musculoskeletal Exam:  Shoulders full ROM no tenderness or swelling Elbows full ROM no tenderness or swelling Wrists full ROM no tenderness or swelling Fingers full ROM no tenderness or swelling Decreased hip internal rotation, with some pain provoked worse with weight bearing, no lateral hip tenderness Knees full ROM no tenderness or swelling   Investigation: No additional findings.  Imaging: No results found.  Recent Labs: Lab Results  Component Value Date   WBC 5.2 08/25/2018   HGB 10.6 (L) 08/25/2018   PLT 334 08/25/2018   NA 140 07/08/2022   K 3.8  07/08/2022   CL 106 07/08/2022   CO2 21 07/08/2022   GLUCOSE 86 07/08/2022   BUN 12 07/08/2022   CREATININE 0.90 07/08/2022   BILITOT 0.3 12/16/2017   ALKPHOS 56 12/16/2017   AST 15 12/16/2017   ALT 7 12/16/2017   PROT 6.4 12/16/2017   ALBUMIN 4.1 12/16/2017   CALCIUM 9.2 07/08/2022   GFRAA 94 12/07/2018    Speciality Comments: No specialty comments available.  Procedures:  No procedures performed Allergies: Cinnamon and Penicillins   Assessment / Plan:     Visit Diagnoses: Systemic lupus erythematosus, unspecified SLE type, unspecified organ involvement status (HCC) - Plan: Anti-DNA antibody, double-stranded, C3 and C4, Sedimentation rate, C-reactive protein, ANA  History of systemic lupus there is no very obvious joint or skin inflammation on exam today.  Checking labs including ANA double-stranded ENA complements sed rate and CRP for serologic evidence of lupus disease activity.  If active would have low threshold to add on steroid sparing DMARD such as hydroxychloroquine would hopefully avoid prednisone due to the existing complications.  If labs  are all reassuring could just monitor for now off disease specific treatment.  Proteinuria, unspecified type - Plan: Protein / creatinine ratio, urine  Previous history of proteinuria without any significant renal disease never had specific biopsy for lupus nephritis.  Does not have significant pedal edema and most recent metabolic panel reviewed were normal.  Will check urine protein creatinine ratio.  Avascular necrosis of femur head, right (HCC)  Right hip is the worst complaint has existing AVN by previous imaging and notes reviewed involvement greater than 50% of joint surface area.  Will need orthopedic follow-up.  Orders: Orders Placed This Encounter  Procedures   Anti-DNA antibody, double-stranded   C3 and C4   Sedimentation rate   C-reactive protein   Protein / creatinine ratio, urine   ANA   No orders of the defined types were placed in this encounter.    Follow-Up Instructions: Return if symptoms worsen or fail to improve.   Fuller Plan, MD  Note - This record has been created using [employer].  Chart creation errors have been sought, but may not always  have been located. Such creation errors do not reflect on  the standard of medical care.

## 2022-12-02 ENCOUNTER — Encounter: Payer: Self-pay | Admitting: Internal Medicine

## 2022-12-02 ENCOUNTER — Ambulatory Visit: Payer: Medicaid Other | Attending: Internal Medicine | Admitting: Internal Medicine

## 2022-12-02 VITALS — BP 163/101 | HR 65 | Resp 16 | Ht 65.0 in | Wt 190.0 lb

## 2022-12-02 DIAGNOSIS — M329 Systemic lupus erythematosus, unspecified: Secondary | ICD-10-CM | POA: Diagnosis not present

## 2022-12-02 DIAGNOSIS — M87051 Idiopathic aseptic necrosis of right femur: Secondary | ICD-10-CM | POA: Diagnosis not present

## 2022-12-02 DIAGNOSIS — R809 Proteinuria, unspecified: Secondary | ICD-10-CM

## 2022-12-03 ENCOUNTER — Other Ambulatory Visit: Payer: Self-pay

## 2022-12-03 ENCOUNTER — Encounter: Payer: Self-pay | Admitting: Student

## 2022-12-03 DIAGNOSIS — M79673 Pain in unspecified foot: Secondary | ICD-10-CM

## 2022-12-03 LAB — ANTI-DNA ANTIBODY, DOUBLE-STRANDED: ds DNA Ab: 4 IU/mL

## 2022-12-03 LAB — SEDIMENTATION RATE: Sed Rate: 6 mm/h (ref 0–20)

## 2022-12-03 LAB — PROTEIN / CREATININE RATIO, URINE
Creatinine, Urine: 381 mg/dL — ABNORMAL HIGH (ref 20–275)
Protein/Creat Ratio: 66 mg/g creat (ref 24–184)
Protein/Creatinine Ratio: 0.066 mg/mg creat (ref 0.024–0.184)
Total Protein, Urine: 25 mg/dL — ABNORMAL HIGH (ref 5–24)

## 2022-12-03 LAB — C3 AND C4
C3 Complement: 109 mg/dL (ref 83–193)
C4 Complement: 22 mg/dL (ref 15–57)

## 2022-12-03 LAB — C-REACTIVE PROTEIN: CRP: 3.4 mg/L (ref <8.0)

## 2022-12-03 LAB — ANA: Anti Nuclear Antibody (ANA): NEGATIVE

## 2022-12-03 MED ORDER — DICLOFENAC SODIUM 1 % EX GEL: 2.0000 g | Freq: Four times a day (QID) | CUTANEOUS | 0 refills | Status: DC

## 2022-12-05 ENCOUNTER — Telehealth: Payer: Medicaid Other | Admitting: Physician Assistant

## 2022-12-05 DIAGNOSIS — L84 Corns and callosities: Secondary | ICD-10-CM

## 2022-12-05 NOTE — Progress Notes (Signed)
Because this looks like development of a callus between the toes due to chronic friction and substantial pain, I feel your condition warrants further evaluation and I recommend that you be seen in a face to face visit.   NOTE: There will be NO CHARGE for this eVisit   If you are having a true medical emergency please call 911.      For an urgent face to face visit, [place] has eight urgent care centers for your convenience:   NEW!! [facility] Center at [facility] Get Driving Directions [phone] [address], [address] [place], [address]    [facility] Care Center at [facility] Get Driving Directions [phone] [address] [place], [place] [address]   [facility] Center [facility]) Get Driving Directions [phone] [address] [place], [place] [address]  [facility] Health Urgent Care Center [facility]) Get Driving Directions [phone] [address] [place],  [place]  [address]  [facility] - at [facility]  [phone] [address] [place],  [place] [address]   [facility] at [facility] Get Driving Directions [phone] [address] [place], [place] [address]   [facility] Health Urgent Care at [facility] Get Driving Directions  [phone] [address] [place], [place] [address]   [facility] Health Urgent Care at [facility] Directions [phone] [address] [place], [place] [address]  Your MyChart E-visit questionnaire answers were reviewed by a board certified advanced clinical practitioner to complete your personal care plan based on your specific symptoms.  Thank you for using e-Visits.

## 2022-12-08 ENCOUNTER — Other Ambulatory Visit: Payer: Self-pay | Admitting: Student

## 2022-12-08 DIAGNOSIS — G8929 Other chronic pain: Secondary | ICD-10-CM

## 2022-12-10 MED ORDER — HYDROCODONE-ACETAMINOPHEN 10-325 MG PO TABS
1.0000 | ORAL_TABLET | Freq: Four times a day (QID) | ORAL | 0 refills | Status: DC | PRN
Start: 2022-12-10 — End: 2023-01-03

## 2023-01-03 ENCOUNTER — Other Ambulatory Visit: Payer: Self-pay

## 2023-01-03 DIAGNOSIS — G8929 Other chronic pain: Secondary | ICD-10-CM

## 2023-01-06 DIAGNOSIS — L84 Corns and callosities: Secondary | ICD-10-CM | POA: Diagnosis not present

## 2023-01-07 ENCOUNTER — Telehealth: Payer: Self-pay | Admitting: Student

## 2023-01-07 ENCOUNTER — Other Ambulatory Visit: Payer: Self-pay | Admitting: Student

## 2023-01-07 MED ORDER — HYDROCODONE-ACETAMINOPHEN 10-325 MG PO TABS
1.0000 | ORAL_TABLET | Freq: Four times a day (QID) | ORAL | 0 refills | Status: DC | PRN
Start: 2023-01-07 — End: 2023-01-31

## 2023-01-07 MED ORDER — HYDROCODONE-ACETAMINOPHEN 10-325 MG PO TABS
1.0000 | ORAL_TABLET | Freq: Four times a day (QID) | ORAL | 0 refills | Status: DC | PRN
Start: 2023-01-07 — End: 2023-01-07

## 2023-01-07 NOTE — Addendum Note (Signed)
Addended by: Levin Erp on: 01/07/2023 02:19 PM   Modules accepted: Orders

## 2023-01-07 NOTE — Telephone Encounter (Signed)
Norco Rx sent as "print" - I called pharmacy and confirmed with pharmacist that this be cancelled so I can resend electronically.

## 2023-01-07 NOTE — Telephone Encounter (Signed)
Patient is here today requesting a referral to see a foot doctor.  Went to UC yesterday and the doctor said he would send the notes to [facility] doctor.  Please call patient regarding referral to a foot doctor.  Need refill for medication (Hydrocodone) called on Friday.

## 2023-01-10 ENCOUNTER — Encounter: Payer: Self-pay | Admitting: Student

## 2023-01-10 ENCOUNTER — Ambulatory Visit (INDEPENDENT_AMBULATORY_CARE_PROVIDER_SITE_OTHER): Payer: Medicaid Other | Admitting: Student

## 2023-01-10 VITALS — BP 148/99 | HR 69 | Ht 65.0 in | Wt 186.2 lb

## 2023-01-10 DIAGNOSIS — I10 Essential (primary) hypertension: Secondary | ICD-10-CM

## 2023-01-10 DIAGNOSIS — L84 Corns and callosities: Secondary | ICD-10-CM

## 2023-01-10 NOTE — Progress Notes (Signed)
  SUBJECTIVE:   CHIEF COMPLAINT / HPI:   Foot Concern:  Seen by Urgent Care for this but came here because she may need a referral to podiatry  History of bunionectomy on left  Fourth toe on right foot callus formation, painful for several weeks, preventing wearing certain shoes, no ulcers or skin breakdown  Never happened before  No fever or chills   PERTINENT  PMH / PSH:  SLE Hip avascular necrosis  HTN Anemia   Patient Care Team: Levin Erp, MD as PCP - General (Family Medicine) Shaune Leeks as Social Worker OBJECTIVE:  BP (!) 148/99   Pulse 69   Ht 5\' 5"  (1.651 m)   Wt 186 lb 4 oz (84.5 kg)   LMP 01/10/2023   SpO2 100%   BMI 30.99 kg/m  Physical Exam  General: Alert and oriented in no apparent distress Heart: Regular rate and rhythm with no murmurs appreciated Lungs: normal WOB Foot: DP pulses palpable  No ulcerations  Normal ROM of toes and ankle  No skin breakdown Callus formation on the right fourth toe laterally   ASSESSMENT/PLAN:  Callus between toes Assessment & Plan: Refer to podiatry and use bandaids for protection over the toe, where wide sole shoes No evidence of infection  Orders: -     Ambulatory referral to Podiatry  HYPERTENSION, BENIGN SYSTEMIC Assessment & Plan: Elevation in BP x2, patient reports of white coat hypertension. With shared decision making, hold off on increasing medications as she checks and they are normal at home. Sending to PCP to keep her in the loop, recheck BP on next visit.     Return if symptoms worsen or fail to improve. Alfredo Martinez, MD 01/11/2023, 6:55 PM PGY-3, [facility] Family Medicine

## 2023-01-10 NOTE — Patient Instructions (Addendum)
It was great to see you today! Thank you for choosing [facility] for your primary care. Angela Walls was seen for follow up.  Today we addressed: Use a few bandaids around the toe  I have referred to Podiatry to be seen   If you haven't already, sign up for My Chart to have easy access to your labs results, and communication with your primary care physician.  I recommend that you always bring your medications to each appointment as this makes it easy to ensure you are on the correct medications and helps [place] not miss refills when you need them. Call the clinic at [phone] if your symptoms worsen or you have any concerns.  You should return to our clinic Return if symptoms worsen or fail to improve. Please arrive 15 minutes before your appointment to ensure smooth check in process.  We appreciate your efforts in making this happen.  Thank you for allowing me to participate in your care, Alfredo Martinez, MD 01/10/2023, 4:28 PM PGY-3, [facility]

## 2023-01-10 NOTE — Assessment & Plan Note (Signed)
Refer to podiatry and use bandaids for protection over the toe, where wide sole shoes No evidence of infection

## 2023-01-11 NOTE — Assessment & Plan Note (Signed)
Elevation in BP x2, patient reports of white coat hypertension. With shared decision making, hold off on increasing medications as she checks and they are normal at home. Sending to PCP to keep her in the loop, recheck BP on next visit.

## 2023-01-30 ENCOUNTER — Ambulatory Visit: Payer: Medicaid Other | Admitting: Podiatry

## 2023-01-31 ENCOUNTER — Other Ambulatory Visit: Payer: Self-pay

## 2023-01-31 DIAGNOSIS — G8929 Other chronic pain: Secondary | ICD-10-CM

## 2023-02-04 MED ORDER — HYDROCODONE-ACETAMINOPHEN 10-325 MG PO TABS
1.0000 | ORAL_TABLET | Freq: Four times a day (QID) | ORAL | 0 refills | Status: DC | PRN
Start: 2023-02-04 — End: 2023-03-07

## 2023-02-06 ENCOUNTER — Ambulatory Visit: Payer: Medicaid Other | Admitting: Podiatry

## 2023-02-06 DIAGNOSIS — M7751 Other enthesopathy of right foot: Secondary | ICD-10-CM | POA: Diagnosis not present

## 2023-02-06 DIAGNOSIS — L84 Corns and callosities: Secondary | ICD-10-CM | POA: Diagnosis not present

## 2023-02-06 NOTE — Progress Notes (Signed)
  Subjective:  Patient ID: Angela Walls, female    DOB: 1980/12/27,  MRN: [id number]  Chief Complaint  Patient presents with   Callouses    Right foot 4 toe callus between toes. Painful.     42 y.o. female presents with right foot callus between the fourth and fifth toe.  She has a painful lesion on the fourth toe on the lateral aspect.  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 On chronic narcotics for this.   Hypertension    Lupus (HCC)    Followed by Rheum. On chronic Prednisone.    Allergies  Allergen Reactions   Cinnamon Swelling   Penicillins Hives and Swelling    Has patient had a PCN reaction causing immediate rash, facial/tongue/throat swelling, SOB or lightheadedness with hypotension: Yes Has patient had a PCN reaction causing severe rash involving mucus membranes or skin necrosis: No Has patient had a PCN reaction that required hospitalization: No Has patient had a PCN reaction occurring within the last 10 years: No If all of the above answers are "NO", then may proceed with Cephalosporin use.     ROS: Negative except as per HPI above  Objective:  General: AAO x3, NAD  Dermatological: Hyperkeratotic lesion present at the lateral aspect of the fourth toe proximal phalangeal joint.  This is related to the fourth and fifth toes rubbing together and causing pressure in between the fourth PIPJ and the fifth DIPJ  Vascular:  Dorsalis Pedis artery and Posterior Tibial artery pedal pulses are 2/4 bilateral.  Capillary fill time < 3 sec to all digits.   Neruologic: Grossly intact via light touch bilateral. Protective threshold intact to all sites bilateral.   Musculoskeletal: Osseous spurring noted in between the fourth and fifth toes with prominence subcutaneously  Gait: Unassisted, Nonantalgic.   No images are attached to the encounter.  Radiographs:  Deferred Assessment:   1. Callus between toes    2. Bone spur of toe of right foot      Plan:  Patient was evaluated and treated and all questions answered.  # Callus between fourth and fifth toe secondary to bone spur # -All symptomatic hyperkeratoses were safely debrided with a sterile #15 blade to patient's level of comfort without incident. We discussed preventative and palliative care of these lesions including supportive and accommodative shoegear, padding, prefabricated and custom molded accommodative orthoses, use of a pumice stone and lotions/creams daily. -Discussed with patient that she does have a hyperkeratotic lesion in between the fourth and fifth toe related to a bone spur in between and pressure between the joints -Discussed conservative measures with the gel toe cap which I dispensed -Surgical consideration for fourth toe PIPJ arthroplasty  Return if symptoms worsen or fail to improve.          Corinna Gab, DPM [facility]

## 2023-03-07 ENCOUNTER — Other Ambulatory Visit: Payer: Self-pay

## 2023-03-07 DIAGNOSIS — G8929 Other chronic pain: Secondary | ICD-10-CM

## 2023-03-07 MED ORDER — HYDROCODONE-ACETAMINOPHEN 10-325 MG PO TABS: 1.0000 | ORAL_TABLET | Freq: Four times a day (QID) | ORAL | 0 refills | Status: DC | PRN

## 2023-03-24 ENCOUNTER — Other Ambulatory Visit: Payer: Medicaid Other | Admitting: *Deleted

## 2023-03-24 NOTE — Patient Outreach (Signed)
Medicaid Managed Care   Nurse Care Manager Note  03/24/2023 Name:  Angela Walls MRN:  [id number] DOB:  [birth year]  Angela Walls is an 42 y.o. year old female who is a primary Walls of Levin Erp, MD.  The [facility] Managed Care Coordination team was consulted for assistance with:    Pain  Angela Walls was given information about Medicaid Managed Care Coordination team services today. Angela Walls agreed to services and verbal consent obtained.  Engaged with Walls by telephone for initial visit in response to provider referral for case management and/or care coordination services.   Assessments/Interventions:  Review of past medical history, allergies, medications, health status, including review of consultants reports, laboratory and other test data, was performed as part of comprehensive evaluation and provision of chronic care management services.  SDOH (Social Determinants of Health) assessments and interventions performed: SDOH Interventions    Flowsheet Row Walls Outreach Telephone from 03/24/2023 in [place] POPULATION HEALTH DEPARTMENT  SDOH Interventions   Food Insecurity Interventions Other (Comment)  [BSW referral, provided with Food and Nutrition [phone]  Housing Interventions Intervention Not Indicated  Transportation Interventions Intervention Not Indicated  Utilities Interventions Intervention Not Indicated       Care Plan  Allergies  Allergen Reactions   Cinnamon Swelling   Penicillins Hives and Swelling    Has Walls had a PCN reaction causing immediate rash, facial/tongue/throat swelling, SOB or lightheadedness with hypotension: Yes Has Walls had a PCN reaction causing severe rash involving mucus membranes or skin necrosis: No Has Walls had a PCN reaction that required hospitalization: No Has Walls had a PCN reaction occurring within the last 10 years: No If all of the above answers are "NO", then may proceed with Cephalosporin  use.     Medications Reviewed Today     Reviewed by Heidi Dach, RN (Registered Nurse) on 03/24/23 at 1348  Med List Status: <None>   Medication Order Taking? Sig Documenting Provider Last Dose Status Informant  amLODipine (NORVASC) 5 MG tablet [id number] No TAKE 1 TABLET BY MOUTH EVERYDAY AT BEDTIME  Walls not taking: Reported on 12/02/2022   Levin Erp, MD Not Taking Active   diclofenac Sodium (VOLTAREN) 1 % GEL [id number] Yes Apply 2 g topically 4 (four) times daily. Levin Erp, MD Taking Active   EPINEPHrine 0.3 mg/0.3 mL IJ SOAJ injection [id number] Yes Inject 0.3 mLs (0.3 mg total) into the muscle as needed for anaphylaxis. Caro Laroche, DO Taking Active   HYDROcodone-acetaminophen [facility]) 10-325 MG tablet [id number] Yes Take 1 tablet by mouth every 6 (six) hours as needed for severe pain. Levin Erp, MD Taking Active   naproxen sodium (ALEVE) 220 MG tablet [id number] Yes Take 220 mg by mouth. [provider] Taking Active   VITAMIN D, CHOLECALCIFEROL, PO [id number] Yes Take by mouth. 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, right 05/23/2015   Routine screening for STI (sexually transmitted infection) 07/29/2014   Heart murmur 11/15/2013   Chronic right hip pain 02/06/2009   Avascular necrosis of femur head, right (HCC) 08/28/2006   HYPERTENSION, BENIGN SYSTEMIC  08/21/2006   SLE 08/21/2006   Proteinuria 08/21/2006    Conditions to be addressed/monitored per PCP order:   Pain  Care Plan : RN Care Manager Plan of Care  Updates made by Heidi Dach, RN since 03/24/2023 12:00 AM     Problem: Health Management needs related to Pain      Long-Range Goal: Development of Plan of Care to address Health Management needs related to Pain   Start Date: 03/24/2023  Expected End Date: 06/22/2023  Note:   Current Barriers:  Chronic Disease Management support and education needs related to Pain Angela Walls suffered a work related injury to her left shoulder and is unable to provide for her family as she once did. She is waiting on disability determination before she moves forward with Orthopedic follow up for right hip pain. She knows that she will need a hip replacement, but is not financially ready to move forward. She did not receive benefit from hip injections in the past.  RNCM Clinical Goal(s):  Walls will verbalize basic understanding of  Pain disease process and self health management plan as evidenced by Walls reports continue to work with RN Care Manager to address care management and care coordination needs related to  Pain as evidenced by adherence to CM Team Scheduled appointments work with Child psychotherapist to address  related to the management of Limited access to food related to the management of Pain as evidenced by review of EMR and Walls or Child psychotherapist report through collaboration with Medical illustrator, provider, and care team.   Interventions: Evaluation of current treatment plan related to  self management and Walls's adherence to plan as established by provider   Pain Interventions:  (Status:  New goal.) Long Term Goal-Right hip and left shoulder Pain assessment performed Medications reviewed Reviewed provider established plan for pain management Discussed importance of adherence to all scheduled medical appointments Counseled on  the importance of reporting any/all new or changed pain symptoms or management strategies to pain management provider Advised Walls to report to care team affect of pain on daily activities Discussed use of relaxation techniques and/or diversional activities to assist with pain reduction (distraction, imagery, relaxation, massage, acupressure, TENS, heat, and cold application Reviewed with Walls prescribed pharmacological and nonpharmacological pain relief strategies Assessed social determinant of health barriers Discussed follow up with Orthopedic provider regarding right hip pain-Walls will follow up when she is finances are in order Reviewed provider notes and discussed with Walls Advised Walls to contact [employer] member services [phone] for member benefits BSW referral for assistance with food benefits-scheduled on 03/27/23  Walls Goals/Self-Care Activities: Call provider office for new concerns or questions  Work with the social worker to address care coordination needs and will continue to work with the clinical team to address health care and disease management related needs Utilize heat or ice to assist with pain management  Follow Up Plan:  Telephone follow up appointment with care management team member scheduled for:  04/23/23 at 1:15pm      Follow Up:  Walls agrees to Care Plan and Follow-up.  Plan: The Managed Medicaid care management team will reach out to the Walls again over the next 30 days.  Date/time of next scheduled RN care  management/care coordination outreach:  04/23/23 at 1:15pm  Estanislado Emms RN, BSN [place]  Value-Based Care [facility] RN Care Coordinator [phone]

## 2023-03-24 NOTE — Patient Instructions (Signed)
Visit Information  Ms. Chiem was given information about Medicaid Managed Care team care coordination services as a part of their Healthy [facility] Medicaid benefit. [name] verbally consented to engagement with the [facility] Managed Care team.   If you are experiencing a medical emergency, please call 911 or report to your local emergency department or urgent care.   If you have a non-emergency medical problem during routine business hours, please contact your provider's office and ask to speak with a nurse.   For questions related to your Healthy [facility] health plan, please call: [phone] or visit the homepage here: [email or web address]  If you would like to schedule transportation through your Healthy [facility] plan, please call the following number at least 2 days in advance of your appointment: [phone]  For information about your ride after you set it up, call Ride Assist at [phone]. Use this number to activate a Will Call pickup, or if your transportation is late for a scheduled pickup. Use this number, too, if you need to make a change or cancel a previously scheduled reservation.  If you need transportation services right away, call [phone]. The after-hours call center is staffed 24 hours to handle ride assistance and urgent reservation requests (including discharges) 365 days a year. Urgent trips include sick visits, hospital discharge requests and life-sustaining treatment.  Call the [facility] Line at [phone], at any time, 24 hours a day, 7 days a week. If you are in danger or need immediate medical attention call 911.  If you would like help to quit smoking, call 1-800-QUIT-NOW (661-158-7723) OR Espaol: 1-855-Djelo-Ya (1-660-630-1601) o para ms informacin haga clic aqu or Text READY to 093-235 to register via text  Ms. Arlana Pouch,   Please see education materials related to pain provided by MyChart  link.  Patient verbalizes understanding of instructions and care plan provided today and agrees to view in MyChart. Active MyChart status and patient understanding of how to access instructions and care plan via MyChart confirmed with patient.     Telephone follow up appointment with Managed Medicaid care management team member scheduled for:04/23/23 at 1:15pm  Estanislado Emms RN, BSN [place]  Value-Based Care [facility] RN Care Coordinator [phone]   Following is a copy of your plan of care:  Care Plan : RN Care Manager Plan of Care  Updates made by Heidi Dach, RN since 03/24/2023 12:00 AM     Problem: Health Management needs related to Pain      Long-Range Goal: Development of Plan of Care to address Health Management needs related to Pain   Start Date: 03/24/2023  Expected End Date: 06/22/2023  Note:   Current Barriers:  Chronic Disease Management support and education needs related to Pain Ms. Lafavor suffered a work related injury to her left shoulder and is unable to provide for her family as she once did. She is waiting on disability determination before she moves forward with Orthopedic follow up for right hip pain. She knows that she will need a hip replacement, but is not financially ready to move forward. She did not receive benefit from hip injections in the past.  RNCM Clinical Goal(s):  Patient will verbalize basic understanding of  Pain disease process and self health management plan as evidenced by patient reports continue to work with RN Care Manager to address care management and care coordination needs related to  Pain as evidenced by adherence to CM Team Scheduled appointments work with Child psychotherapist  to address  related to the management of Limited access to food related to the management of Pain as evidenced by review of EMR and patient or social worker report through collaboration with Medical illustrator, provider, and care team.    Interventions: Evaluation of current treatment plan related to  self management and patient's adherence to plan as established by provider   Pain Interventions:  (Status:  New goal.) Long Term Goal-Right hip and left shoulder Pain assessment performed Medications reviewed Reviewed provider established plan for pain management Discussed importance of adherence to all scheduled medical appointments Counseled on the importance of reporting any/all new or changed pain symptoms or management strategies to pain management provider Advised patient to report to care team affect of pain on daily activities Discussed use of relaxation techniques and/or diversional activities to assist with pain reduction (distraction, imagery, relaxation, massage, acupressure, TENS, heat, and cold application Reviewed with patient prescribed pharmacological and nonpharmacological pain relief strategies Assessed social determinant of health barriers Discussed follow up with Orthopedic provider regarding right hip pain-patient will follow up when she is finances are in order Reviewed provider notes and discussed with patient Advised patient to contact [employer] member services [phone] for member benefits BSW referral for assistance with food benefits-scheduled on 03/27/23  Patient Goals/Self-Care Activities: Call provider office for new concerns or questions  Work with the social worker to address care coordination needs and will continue to work with the clinical team to address health care and disease management related needs Utilize heat or ice to assist with pain management  Follow Up Plan:  Telephone follow up appointment with care management team member scheduled for:  04/23/23 at 1:15pm

## 2023-03-27 ENCOUNTER — Other Ambulatory Visit: Payer: Medicaid Other

## 2023-03-27 NOTE — Patient Outreach (Signed)
  Medicaid Managed Care   Unsuccessful Outreach Note  03/27/2023 Name: Angela Walls MRN: [id number] DOB: 1981-04-10  Referred by: Levin Erp, MD Reason for referral : High Risk Managed Medicaid (MM Social work unsuccessful telephone outreach)   An unsuccessful telephone outreach was attempted today. The patient was referred to the case management team for assistance with care management and care coordination.   Follow Up Plan: A HIPAA compliant phone message was left for the patient providing contact information and requesting a return call.   Abelino Derrick, MHA [facility]  Managed [facility] Social Worker [phone]

## 2023-03-27 NOTE — Patient Instructions (Signed)
  Medicaid Managed Care   Unsuccessful Outreach Note  03/27/2023 Name: Angela Walls MRN: [id number] DOB: 1981-04-10  Referred by: Levin Erp, MD Reason for referral : High Risk Managed Medicaid (MM Social work unsuccessful telephone outreach)   An unsuccessful telephone outreach was attempted today. The patient was referred to the case management team for assistance with care management and care coordination.   Follow Up Plan: A HIPAA compliant phone message was left for the patient providing contact information and requesting a return call.   Abelino Derrick, MHA [facility]  Managed [facility] Social Worker [phone]

## 2023-04-02 ENCOUNTER — Telehealth: Payer: Self-pay

## 2023-04-02 NOTE — Telephone Encounter (Signed)
..   Medicaid Managed Care   Unsuccessful Outreach Note  04/02/2023 Name: Angela Walls MRN: [id number] DOB: 07/20/1980  Referred by: Levin Erp, MD Reason for referral : Appointment   A second unsuccessful telephone outreach was attempted today. The patient was referred to the case management team for assistance with care management and care coordination.   Follow Up Plan: A HIPAA compliant phone message was left for the patient providing contact information and requesting a return call.  The care management team will reach out to the patient again over the next 7 days.   Weston Settle Care Guide  [facility] Managed  Care Guide [facility]  [phone]

## 2023-04-07 ENCOUNTER — Other Ambulatory Visit: Payer: Self-pay

## 2023-04-07 DIAGNOSIS — G8929 Other chronic pain: Secondary | ICD-10-CM

## 2023-04-07 MED ORDER — HYDROCODONE-ACETAMINOPHEN 10-325 MG PO TABS: 1.0000 | ORAL_TABLET | Freq: Four times a day (QID) | ORAL | 0 refills | Status: DC | PRN

## 2023-04-08 ENCOUNTER — Telehealth: Payer: Self-pay

## 2023-04-08 NOTE — Telephone Encounter (Signed)
PA approved through 10/05/2023.  Pharmacy and patient have been updated.

## 2023-04-08 NOTE — Telephone Encounter (Signed)
PA for Hydrocodone-Acetaminophen 10-325mg  submitted via covermymeds.   Key: [id number]

## 2023-04-09 ENCOUNTER — Telehealth: Payer: Self-pay | Admitting: Family Medicine

## 2023-04-09 MED ORDER — HYDROCODONE-ACETAMINOPHEN 10-325 MG PO TABS: 1.0000 | ORAL_TABLET | Freq: Four times a day (QID) | ORAL | 0 refills | Status: DC | PRN

## 2023-04-09 NOTE — Telephone Encounter (Addendum)
After hours call:  Spoke with patient regarding Norco rx. She states insurance told her that the PA had went through but that "only 120 tablets had been approved, not Norco." I called the insurance company who told me the PA had been approved but only for 5 days but the script was sent for 30 days. They also told me the prescriber had "a problem with their NPI" which was also contributing to the difficulty getting the medication. I asked if I could send in a 5 day rx but insurance stated the patient would be unlikely to get the remainder of the month covered if I were to send it in. I discussed with attending Dr. Lum Babe who will take a look into the situation and see if she can send in a couple days of Norco until the insurance issue can be figured out. I sent a message to patient informing her of situation.  ADDENDUM 4:49 PM: Contacted patient to inform her of Dr. Donnetta Hail conversation with pharmacy.  Pharmacy stated similar instructions as the did to me above.  Dr. Lum Babe has sent in short course of Norco.  Advised patient she can pick this up.  Otherwise, would recommend going to ED to prevent opiate withdrawal.  Will send message to PCP Dr. Willene Hatchet so she can look more into prior authorization going forward.  Appreciate everyone's assistance.

## 2023-04-09 NOTE — Addendum Note (Signed)
Addended by: Janit Pagan T on: 04/09/2023 04:12 PM   Modules accepted: Orders

## 2023-04-09 NOTE — Telephone Encounter (Signed)
Patient calls nurse line in regards to Hydrocodone prescription.   Prescription is due to be filled today and I obtained a prior authorization approval yesterday and informed pharmacy. See details below.   Patient reports the pharmacy stated this morning that the medication was still being rejected by insurance due to needing a prior authorization.   I called the pharmacy and spoke with pharmacy tech who informed me a PA was still needed. Pharmacy tech stated, "there is nothing we can do maybe it will go through tomorrow."   I called the patient and explained the situation. Patient understandably frustrated. Patient will contact her insurance, all PA information was given on approval.   Patient will call after hours line if problem continues for a one time dose at a different pharmacy as we are closed the rest of the day today.

## 2023-04-11 ENCOUNTER — Telehealth: Payer: Self-pay

## 2023-04-11 NOTE — Telephone Encounter (Signed)
Patient calls nurse line in regards to PA on hydrocodone (see previous notes.)   Patient reports she went ahead and picked up the original #120, however had to pay cash.   Patient is now requesting we obtain another PA with back dates so the pharmacy will refund her the money she paid.   I attempted a second PA, however this one was denied.   I have attempted to call her insurance company x3, however either the call was dropped or I was not able to speak to a live agent.  [facility] confirmed they deleted out Hydrocodone #60 written by Lum Babe to help rectify situation on 10/16.   Patient only picked up prescription written originally by [facility].   Will continue to reach out to her insurance.

## 2023-04-16 ENCOUNTER — Telehealth: Payer: Self-pay

## 2023-04-17 NOTE — Telephone Encounter (Signed)
Spoke with [place] with patients insurance in regards to prior auth complications.   She reports on their end the PA was approved effective 10/15. I advised them [facility] continued to state multiple times that the PA was not approved and ultimately the patient had to pay cash out of pocket.   Mechele Dawley informed me it appears [facility] did not add the necessary over ride codes for PCP DEA #.   We spoke with [employer] for [facility] and ultimately we were able to get a refund for the patient.   The refund can take up to 2-4 weeks and will be mailed to the patient.   Patient has been updated and given the contact information for [employer] to track her refund.   [phone]- RBD

## 2023-04-18 ENCOUNTER — Telehealth: Payer: Self-pay

## 2023-04-18 NOTE — Telephone Encounter (Signed)
Spoke with patient made appt to discuss opioid denial from insurance. Set for 11/19 at 11:10. Aquilla Solian, CMA

## 2023-04-18 NOTE — Telephone Encounter (Signed)
-----   Message from [facility] sent at 04/18/2023 12:45 PM EDT ----- Regarding: Appt Patient need in patient appointment for opioid denial from insurance

## 2023-04-23 ENCOUNTER — Other Ambulatory Visit: Payer: Self-pay | Admitting: *Deleted

## 2023-04-23 NOTE — Patient Instructions (Signed)
Thank you for taking time to speak with me about care coordination and care management services available to you at no cost as part of your Medicaid benefit. These services are voluntary. I see that you declined case management on 04/16/23. Our team is available to provide assistance regarding your health care needs at any time. Please do not hesitate to reach out to me if we can be of service to you at any time in the future.   Estanislado Emms RN, BSN [place]  Value-Based Care Institute [facility] RN Care Coordinator [phone]

## 2023-04-23 NOTE — Patient Outreach (Signed)
   Care Management/Care Coordination  Managed Medicaid Care Management Case Closure Note  04/23/2023 Name: Angela Walls MRN: [id number] DOB: 27-Jun-1980  Angela Walls is a 42 y.o. year old female who is a primary care patient of Levin Erp, MD. The care management/care coordination team was consulted for assistance with chronic disease management and/or care coordination needs.   Care Plan : RN Care Manager Plan of Care  Updates made by Heidi Dach, RN since 04/23/2023 12:00 AM  Completed 04/23/2023   Problem: Health Management needs related to Pain Resolved 04/23/2023     Long-Range Goal: Development of Plan of Care to address Health Management needs related to Pain Completed 04/23/2023  Start Date: 03/24/2023  Expected End Date: 06/22/2023  Note:   Current Barriers:  Chronic Disease Management support and education needs related to Pain  Patient declined CM-per note on 04/16/23  RNCM Clinical Goal(s):  Patient will verbalize basic understanding of  Pain disease process and self health management plan as evidenced by patient reports continue to work with RN Care Manager to address care management and care coordination needs related to  Pain as evidenced by adherence to CM Team Scheduled appointments work with Child psychotherapist to address  related to the management of Limited access to food related to the management of Pain as evidenced by review of EMR and patient or Child psychotherapist report through collaboration with Medical illustrator, provider, and care team.   Interventions: Evaluation of current treatment plan related to  self management and patient's adherence to plan as established by provider   Pain Interventions:  (Status:  Patient declined further engagement on this goal.) Long Term Goal-Right hip and left shoulder Pain assessment performed Medications reviewed Reviewed provider established plan for pain management Discussed importance of adherence to all scheduled  medical appointments Counseled on the importance of reporting any/all new or changed pain symptoms or management strategies to pain management provider Advised patient to report to care team affect of pain on daily activities Discussed use of relaxation techniques and/or diversional activities to assist with pain reduction (distraction, imagery, relaxation, massage, acupressure, TENS, heat, and cold application Reviewed with patient prescribed pharmacological and nonpharmacological pain relief strategies Assessed social determinant of health barriers Discussed follow up with Orthopedic provider regarding right hip pain-patient will follow up when she is finances are in order Reviewed provider notes and discussed with patient Advised patient to contact [employer] member services [phone] for member benefits BSW referral for assistance with food benefits-scheduled on 03/27/23  Patient Goals/Self-Care Activities: Call provider office for new concerns or questions  Work with the social worker to address care coordination needs and will continue to work with the clinical team to address health care and disease management related needs Utilize heat or ice to assist with pain management  Follow Up Plan:  Telephone follow up appointment with care management team member scheduled for:  04/23/23 at 1:15pm      Plan: The patient declines further follow up and engagement by the care management team and the case will be closed. Appropriate care team members and provider have been notified via electronic communication and the patient has been provided contact information for the care management team. The care management team is available to at any time in the future should needs arise.   Estanislado Emms RN, BSN [facility] RN Care Coordinator [phone]

## 2023-05-08 ENCOUNTER — Other Ambulatory Visit: Payer: Self-pay | Admitting: Student

## 2023-05-08 ENCOUNTER — Other Ambulatory Visit: Payer: Self-pay

## 2023-05-08 DIAGNOSIS — G8929 Other chronic pain: Secondary | ICD-10-CM

## 2023-05-08 MED ORDER — HYDROCODONE-ACETAMINOPHEN 10-325 MG PO TABS
1.0000 | ORAL_TABLET | Freq: Four times a day (QID) | ORAL | 0 refills | Status: DC | PRN
Start: 2023-05-08 — End: 2023-05-20

## 2023-05-08 NOTE — Telephone Encounter (Signed)
Patient calls nurse line regarding rx refill.   Per insurance guidelines, patient needs attending to send in prescription.   Will forward to Dr. Manson Passey (PM preceptor).   Patient has upcoming appointment on 11/25 for follow up.   Veronda Prude, RN

## 2023-05-09 MED ORDER — DICLOFENAC SODIUM 1 % EX GEL: 2.0000 g | Freq: Four times a day (QID) | CUTANEOUS | 0 refills | Status: AC

## 2023-05-13 ENCOUNTER — Ambulatory Visit: Payer: Medicaid Other | Admitting: Student

## 2023-05-19 ENCOUNTER — Ambulatory Visit: Payer: Medicaid Other | Admitting: Student

## 2023-05-19 ENCOUNTER — Encounter: Payer: Self-pay | Admitting: Student

## 2023-05-19 ENCOUNTER — Ambulatory Visit (HOSPITAL_COMMUNITY)
Admission: RE | Admit: 2023-05-19 | Discharge: 2023-05-19 | Disposition: A | Discharge status: Home / Self Care | Payer: Medicaid Other | Source: Ambulatory Visit | Attending: Family Medicine

## 2023-05-19 VITALS — BP 165/101 | HR 76 | Ht 65.0 in | Wt 187.2 lb

## 2023-05-19 DIAGNOSIS — R079 Chest pain, unspecified: Secondary | ICD-10-CM | POA: Diagnosis not present

## 2023-05-19 DIAGNOSIS — I10 Essential (primary) hypertension: Secondary | ICD-10-CM

## 2023-05-19 DIAGNOSIS — R0789 Other chest pain: Secondary | ICD-10-CM | POA: Diagnosis not present

## 2023-05-19 DIAGNOSIS — Z79899 Other long term (current) drug therapy: Secondary | ICD-10-CM

## 2023-05-19 MED ORDER — AMLODIPINE BESYLATE 10 MG PO TABS
10.0000 mg | ORAL_TABLET | Freq: Every day | ORAL | 0 refills | Status: DC
Start: 2023-05-19 — End: 2023-07-14

## 2023-05-19 NOTE — Addendum Note (Signed)
Addended by: Levin Erp on: 05/19/2023 12:58 PM   Modules accepted: Level of Service

## 2023-05-19 NOTE — Patient Instructions (Signed)
It was great to see you! Thank you for allowing me to participate in your care!   Our plans for today:  - I am referring to cardiology for evaluation, your EKG looks okay right now - Please keep me updated on your medication  Take care and seek immediate care sooner if you develop any concerns.  Levin Erp, MD

## 2023-05-19 NOTE — Progress Notes (Addendum)
    SUBJECTIVE:   CHIEF COMPLAINT / HPI: Hip AVN  SOB  Chest Pain Reports breathing difficulty, reports increased exhaustion and difficulty breathing over the past four weeks. They have scheduled an appointment with a rheumatologist to address these concerns. States that she has left sided chest pain with ambulating that resolves quickly after stopping walking. Denies any radiation of pain. Denies any swelling or orthopnea. Denies any chest pain at rest or currently.  AVN The patient is also dealing with a worker's compensation case, which has been a source of stress and financial strain. They are awaiting a decision on their case, which is expected in January or February. The patient's hip pain has been worsening, to the point where over-the-counter medications like Aleve no longer provide relief. They have been managing the pain with Norco, which they take approximately four times a day, particularly at night when the pain disrupts their sleep. The patient is aware that they need surgery for their hip, but have been delaying it due to their financial situation and the stress of their worker's compensation case. They plan to schedule the surgery once their case is resolved. They express a desire to stop taking medication and resolve their health issues with surgery.  HTN Takes amlodipine 5 mg daily.  States that her blood pressures have been ranging from SBP's 120s to 130s at home.  Does have history of whitecoat hypertension show none ambulatory blood pressure cuff.  Does have many SBP's in the 130s per patient.  PERTINENT  PMH / PSH: SLE  OBJECTIVE:   BP (!) 165/101   Pulse 76   Ht 5\' 5"  (1.651 m)   Wt 187 lb 3.2 oz (84.9 kg)   SpO2 98%   BMI 31.15 kg/m   General: Well appearing, NAD, awake, alert, responsive to questions Head: Normocephalic atraumatic CV: Regular rate and rhythm no murmurs rubs or gallops Respiratory:chest rises symmetrically,  no increased work of  breathing Extremities: Moves upper and lower extremities freely, no edema in LE  ASSESSMENT/PLAN:   Assessment & Plan Chest pain on exertion Concerning for angina.  EKG today with nonspecific T wave changes but no acute ST elevations. -Cardiology referral High risk medication use Continuing Norco 10-3 25.  Only got 60 tablets on last refill by attending.  Plan to refill full 120 at next need for refill. -Toxassure obtained today Essential hypertension, benign BP elevated x 2.  Does have history of whitecoat hypertension.  She does have home blood pressures that have been over 130s. -Increase amlodipine to 10 mg daily -1 month blood pressure recheck   Levin Erp, MD [facility]

## 2023-05-19 NOTE — Assessment & Plan Note (Signed)
BP elevated x 2.  Does have history of whitecoat hypertension.  She does have home blood pressures that have been over 130s. -Increase amlodipine to 10 mg daily -1 month blood pressure recheck

## 2023-05-20 ENCOUNTER — Telehealth: Payer: Self-pay

## 2023-05-20 DIAGNOSIS — G8929 Other chronic pain: Secondary | ICD-10-CM

## 2023-05-20 NOTE — Telephone Encounter (Signed)
Patient calls nurse line requesting the remainder of Hydrocodone prescription.   She reports #60 was called in on 11/14, however she gets #120 a month.   She reports she will be out by Thanksgiving and is requesting a refill to be picked up tomorrow before Holiday closures.   Advised will forward to PCP.   Please have an attending send in.

## 2023-05-21 ENCOUNTER — Other Ambulatory Visit: Payer: Self-pay

## 2023-05-21 DIAGNOSIS — G8929 Other chronic pain: Secondary | ICD-10-CM

## 2023-05-21 LAB — TOXASSURE SELECT 13 (MW), URINE

## 2023-05-21 MED ORDER — HYDROCODONE-ACETAMINOPHEN 10-325 MG PO TABS: 1.0000 | ORAL_TABLET | Freq: Four times a day (QID) | ORAL | 0 refills | Status: DC | PRN

## 2023-05-21 NOTE — Addendum Note (Signed)
Addended by: Levin Erp on: 05/21/2023 09:38 AM   Modules accepted: Orders

## 2023-05-21 NOTE — Telephone Encounter (Signed)
Patient calls nurse line regarding prescription.   Patient reports that rx was supposed to be sent to [facility] [address]. Rx was sent to [facility]. Called and canceled prescription.   Patient also recently picked up #60 hydrocodone-acetaminophen on 05/08/23.  Patient will need updated prescription [facility] to [facility] [address].    Veronda Prude, RN

## 2023-05-26 ENCOUNTER — Telehealth: Payer: Self-pay | Admitting: Student

## 2023-05-26 NOTE — Telephone Encounter (Signed)
Can you please schedule patient a visit to repeat toxassure prior to next Norco fill? Thanks!

## 2023-06-01 ENCOUNTER — Encounter: Payer: Self-pay | Admitting: Student

## 2023-06-02 ENCOUNTER — Ambulatory Visit: Payer: Medicaid Other | Admitting: Student

## 2023-06-13 ENCOUNTER — Telehealth: Payer: Self-pay

## 2023-06-13 NOTE — Telephone Encounter (Signed)
Patient calls nurse line to schedule follow up appointment per Dr. Laroy Apple.   PCP does not have availability until the beginning of January. Spoke with Dr. Laroy Apple to determine if patient needs provider appointment or lab visit.   Dr. Laroy Apple states that patient needs to see provider. As patient is due for refill of pain meds on 12/27, scheduled for Monday afternoon with Dr. Elliot Gurney.   Please see the following result message: Spoke with [name] about negative tox assure.  Does have low urinary creatinine (normal is above 20).  Plan for patient to repeat again before next fill.  Will message her CMA's to schedule this.   Please document date/time of last dose prior to tox assure being obtained at next visit.   Forwarding to Dr. Elliot Gurney.   Veronda Prude, RN

## 2023-06-16 ENCOUNTER — Encounter: Payer: Self-pay | Admitting: Student

## 2023-06-16 ENCOUNTER — Ambulatory Visit: Payer: Medicaid Other | Admitting: Student

## 2023-06-16 VITALS — BP 153/100 | HR 75 | Wt 185.4 lb

## 2023-06-16 DIAGNOSIS — G8929 Other chronic pain: Secondary | ICD-10-CM | POA: Diagnosis not present

## 2023-06-16 DIAGNOSIS — M25551 Pain in right hip: Secondary | ICD-10-CM

## 2023-06-16 NOTE — Patient Instructions (Signed)
Pleasure to meet you today.  Today we collected urine sample to thanks for toxicology screening.  Blood pressure today was slightly elevated.  Please continue to take your medications as prescribed.

## 2023-06-16 NOTE — Progress Notes (Addendum)
    SUBJECTIVE:   CHIEF COMPLAINT / HPI: Right Hip Pain  42 year old female with history of Chronic right hip pain on long term opiate. Presenting today for follow up. Patient report continued hip pain requiring frequent use of opioid which provides relieve. Patient stated she occasionally take her pain meds at night when it it bothersome and last time she took her pain med was last night. She stated she has seen orthopedic surgery in past and aware she will require surgery but putting off surgery at this time due to worker's comp.   PERTINENT  PMH / PSH: Reviewed   OBJECTIVE:   BP (!) 153/100   Pulse 75   Wt 185 lb 6.4 oz (84.1 kg)   LMP 06/08/2023   SpO2 100%   BMI 30.85 kg/m    Physical Exam General: Alert, well appearing, NAD Cardiovascular: RRR, well perfused Respiratory: Normal WOB on RA  Extremities: Muscle strength on all extremities 5/5. Limited ROM on the right hip more prominent with abduction   ASSESSMENT/PLAN:   Chronic right hip pain Chronic hip pain on opoid. Endorses good pain control with Norco. Most recent Urine toxicology screen appeared diluted and negative opiate on sample. Will re-obtain urine toxicology today due to abnormal or inconsistent result from previous test.  -Order Urine Toxassure -Discussed need for surgery given persistent pain and prior discussion with ortho     Jerre Simon, MD [facility]

## 2023-06-17 ENCOUNTER — Encounter: Payer: Self-pay | Admitting: Student

## 2023-06-18 NOTE — Assessment & Plan Note (Signed)
Chronic hip pain on opoid. Endorses good pain control with Norco. Most recent Urine toxicology screen appeared diluted and negative opiate on sample. Will re-obtain urine toxicology today due to abnormal or inconsistent result from previous test.  -Order Urine Toxassure -Discussed need for surgery given persistent pain and prior discussion with ortho

## 2023-06-19 ENCOUNTER — Other Ambulatory Visit: Payer: Self-pay

## 2023-06-19 DIAGNOSIS — G8929 Other chronic pain: Secondary | ICD-10-CM

## 2023-06-19 LAB — TOXASSURE SELECT 13 (MW), URINE

## 2023-06-19 MED ORDER — HYDROCODONE-ACETAMINOPHEN 10-325 MG PO TABS: 1.0000 | ORAL_TABLET | Freq: Four times a day (QID) | ORAL | 0 refills | Status: DC | PRN

## 2023-06-22 ENCOUNTER — Telehealth: Payer: Self-pay | Admitting: Student

## 2023-06-22 DIAGNOSIS — G8929 Other chronic pain: Secondary | ICD-10-CM

## 2023-06-22 MED ORDER — HYDROCODONE-ACETAMINOPHEN 10-325 MG PO TABS: 1.0000 | ORAL_TABLET | Freq: Four times a day (QID) | ORAL | 0 refills | Status: DC | PRN

## 2023-06-22 NOTE — Telephone Encounter (Signed)
Received after hours call. Called patient back and confirmed identity.  Patient has been unable to obtain her hydrocodone-acetaminophen 10-325 mg tablets.  She was recently seen and a urine drug screen showed metabolites of her medication, and refill was sent by Dr. Yetta Barre behalf of Dr. Laroy Apple.  Unfortunately, the pharmacy will not recognize Dr. Yetta Barre DEA number.  I informed patient that I will try to send a prescription today, however this may be an issue for this provider as well.  I will forward this to PCP in hopes of resolving the issue.  Acknowledged patient's frustration, and thanked patient for her patience.   - Refilled prescription previously sent by Dr. Yetta Barre

## 2023-06-23 ENCOUNTER — Other Ambulatory Visit: Payer: Self-pay | Admitting: Student

## 2023-06-23 DIAGNOSIS — G8929 Other chronic pain: Secondary | ICD-10-CM

## 2023-06-23 MED ORDER — HYDROCODONE-ACETAMINOPHEN 10-325 MG PO TABS: 1.0000 | ORAL_TABLET | Freq: Four times a day (QID) | ORAL | 0 refills | Status: DC | PRN

## 2023-07-14 ENCOUNTER — Other Ambulatory Visit: Payer: Self-pay | Admitting: Student

## 2023-07-14 DIAGNOSIS — I10 Essential (primary) hypertension: Secondary | ICD-10-CM

## 2023-07-15 ENCOUNTER — Telehealth: Payer: Self-pay

## 2023-07-15 NOTE — Telephone Encounter (Signed)
Called and left a voice mail informing the patient that Dr.Jagadish would like for her to schedule a bp f/u. If patient call back she can schedule with any doctor if Laroy Apple is not available.

## 2023-07-15 NOTE — Telephone Encounter (Signed)
-----   Message from [facility] sent at 07/14/2023  4:57 PM EST ----- Schedule with whoever for BP follow up

## 2023-07-23 ENCOUNTER — Other Ambulatory Visit: Payer: Self-pay

## 2023-07-23 DIAGNOSIS — G8929 Other chronic pain: Secondary | ICD-10-CM

## 2023-07-23 MED ORDER — HYDROCODONE-ACETAMINOPHEN 10-325 MG PO TABS: 1.0000 | ORAL_TABLET | Freq: Four times a day (QID) | ORAL | 0 refills | Status: DC | PRN

## 2023-07-30 ENCOUNTER — Other Ambulatory Visit: Payer: Self-pay | Admitting: Student

## 2023-07-30 DIAGNOSIS — I10 Essential (primary) hypertension: Secondary | ICD-10-CM

## 2023-08-21 ENCOUNTER — Other Ambulatory Visit: Payer: Self-pay

## 2023-08-21 DIAGNOSIS — G8929 Other chronic pain: Secondary | ICD-10-CM

## 2023-08-21 MED ORDER — HYDROCODONE-ACETAMINOPHEN 10-325 MG PO TABS: 1.0000 | ORAL_TABLET | Freq: Four times a day (QID) | ORAL | 0 refills | Status: DC | PRN

## 2023-08-23 ENCOUNTER — Telehealth: Payer: Self-pay | Admitting: Student

## 2023-08-23 ENCOUNTER — Encounter: Payer: Self-pay | Admitting: Student

## 2023-08-23 NOTE — Telephone Encounter (Signed)
**  After Hours/ Emergency Line Call**  Received a page to call [phone]) - [phone].  Patient: Angela Walls  Caller: Self  Confirmed name & DOB of patient with caller  Subjective:  Patient reports that she is out of her opioid pain medication.  She states that she is unable to get it filled due to an issue with her PCP prescribing.  She reports pharmacy states that PCP is not able to prescribe this medication.  This has happened in the past, specifically received similar call on 06/22/2023-at that time medication was refilled.  Discussed that this is an after-hours emergency line, and empathized that patient is out of her medication however due to the recurrence of this problem it would be better resolved on Monday when PCP can reply.  Notified patient that I would forward this message to PCP and I will be addressed on Monday.  Patient stated that she would not wait that long, and that this is not her problem.  She stated she would send message to Dr. Willene Hatchet on MyChart and then hung up.  Objective:  Observations: NAD  Assessment & Plan  Angela Walls is a 43 y.o. female who calls with the following complaints and concerns: Out of prescription pain medication.  Recommendations:  Recommend patient anticipate refill needs and contact office during regular business hours, as she acknowledges this is a recurring problem. Will forward to PCP and attempt to resolve prescribing issue  Tiffany Kocher, DO [facility], PGY-1

## 2023-08-25 NOTE — Telephone Encounter (Signed)
 Called pharmacy. Patient was able to pick up prescription on Sunday, 08/24/23.  Veronda Prude, RN

## 2023-08-28 ENCOUNTER — Other Ambulatory Visit: Payer: Self-pay

## 2023-08-28 ENCOUNTER — Ambulatory Visit: Payer: Medicaid Other | Attending: Cardiology | Admitting: Cardiology

## 2023-08-28 ENCOUNTER — Encounter: Payer: Self-pay | Admitting: Cardiology

## 2023-08-28 VITALS — BP 140/90 | HR 67 | Resp 16 | Ht 65.0 in | Wt 180.0 lb

## 2023-08-28 DIAGNOSIS — D649 Anemia, unspecified: Secondary | ICD-10-CM

## 2023-08-28 DIAGNOSIS — R072 Precordial pain: Secondary | ICD-10-CM

## 2023-08-28 DIAGNOSIS — I10 Essential (primary) hypertension: Secondary | ICD-10-CM | POA: Diagnosis not present

## 2023-08-28 MED ORDER — ASPIRIN 81 MG PO TBEC: 81.0000 mg | DELAYED_RELEASE_TABLET | Freq: Every day | ORAL | Status: AC

## 2023-08-28 MED ORDER — METOPROLOL TARTRATE 25 MG PO TABS: 25.0000 mg | ORAL_TABLET | Freq: Two times a day (BID) | ORAL | 3 refills | Status: AC

## 2023-08-28 MED ORDER — METOPROLOL TARTRATE 25 MG PO TABS: 25.0000 mg | ORAL_TABLET | Freq: Two times a day (BID) | ORAL | 3 refills | Status: DC

## 2023-08-28 NOTE — Patient Instructions (Addendum)
 Medication Instructions:  Your physician has recommended you make the following change in your medication:   START Aspirin 81 mg once daily   START Metoprolol Tartrate (Lopressor) 25 mg twice daily starting now until your coronary CTA is complete.   *Hold for SBP less than 100 and/or heart rate less than 55.  *If you need a refill on your cardiac medications before your next appointment, please call your pharmacy*  Lab Work: None ordered today. If you have labs (blood work) drawn today and your tests are completely normal, you will receive your results only by: MyChart Message (if you have MyChart) OR A paper copy in the mail If you have any lab test that is abnormal or we need to change your treatment, we will call you to review the results.  Testing/Procedures: Your physician has requested that you have cardiac CTA. Cardiac computed tomography (CT) is a painless test that uses an x-ray machine to take clear, detailed pictures of your heart. For further information please visit [email or web address] Please follow instruction sheet as given.   Your physician has requested that you have an echocardiogram. Echocardiography is a painless test that uses sound waves to create images of your heart. It provides your doctor with information about the size and shape of your heart and how well your heart's chambers and valves are working. This procedure takes approximately one hour. There are no restrictions for this procedure. Please do NOT wear cologne, perfume, aftershave, or lotions (deodorant is allowed). Please arrive 15 minutes prior to your appointment time.  Please note: We ask at that you not bring children with you during ultrasound (echo/ vascular) testing. Due to room size and safety concerns, children are not allowed in the ultrasound rooms during exams. Our front office staff cannot provide observation of children in our lobby area while testing is being conducted. An adult accompanying a  patient to their appointment will only be allowed in the ultrasound room at the discretion of the ultrasound technician under special circumstances. We apologize for any inconvenience.   Follow-Up: At [facility], you and your health needs are our priority.  As part of our continuing mission to provide you with exceptional heart care, we have created designated Provider Care Teams.  These Care Teams include your primary Cardiologist (physician) and Advanced Practice Providers (APPs -  Physician Assistants and Nurse Practitioners) who all work together to provide you with the care you need, when you need it.  We recommend signing up for the patient portal called "MyChart".  Sign up information is provided on this After Visit Summary.  MyChart is used to connect with patients for Virtual Visits (Telemedicine).  Patients are able to view lab/test results, encounter notes, upcoming appointments, etc.  Non-urgent messages can be sent to your provider as well.   To learn more about what you can do with MyChart, go to [email or web address].    Your next appointment:   8 week(s)  The format for your next appointment:   In Person  Provider:   Tessa Lerner, DO {  Other Instructions .  Your cardiac CT will be scheduled at one of the below locations:   [facility] [address] [place], [place] [address] [phone]  If scheduled at [facility], please arrive at the [facility] and Children's Entrance (Entrance C2) of [facility] 30 minutes prior to test start time. You can use the FREE valet parking offered at entrance C (encouraged to control the heart rate  for the test)  Proceed to the [facility] Radiology Department (first floor) to check-in and test prep.  All radiology patients and guests should use entrance C2 at [facility], accessed from [facility], even though the hospital's physical address listed is [address]     Please follow these instructions carefully (unless otherwise directed):   On the Night Before the Test: Be sure to Drink plenty of water. Do not consume any caffeinated/decaffeinated beverages or chocolate 12 hours prior to your test. Do not take any antihistamines 12 hours prior to your test.  On the Day of the Test: Drink plenty of water until 1 hour prior to the test. Do not eat any food 1 hour prior to test. You may take your regular medications prior to the test.  Take metoprolol (Lopressor) 25 mg two hours prior to test. FEMALES- please wear underwire-free bra if available, avoid dresses & tight clothing  After the Test: Drink plenty of water. After receiving IV contrast, you may experience a mild flushed feeling. This is normal. On occasion, you may experience a mild rash up to 24 hours after the test. This is not dangerous. If this occurs, you can take Benadryl 25 mg, Zyrtec, Claritin, or Allegra and increase your fluid intake. (Patients taking Tikosyn should avoid Benadryl, and may take Zyrtec, Claritin, or Allegra) If you experience trouble breathing, this can be serious. If it is severe call 911 IMMEDIATELY. If it is mild, please call our office.  We will call to schedule your test 2-4 weeks out understanding that some insurance companies will need an authorization prior to the service being performed.   For more information and frequently asked questions, please visit our website : [email or web address]  For non-scheduling related questions, please contact the cardiac imaging nurse navigator should you have any questions/concerns: Cardiac Imaging Nurse Navigators Direct Office Dial: [phone]   For scheduling needs, including cancellations and rescheduling, please call [place], [phone].

## 2023-08-28 NOTE — Progress Notes (Signed)
 Cardiology Office Note:    Date:  08/31/2023  NAME:  Angela Walls    MRN: [id number] DOB:  03-22-1981   PCP:  Levin Erp, MD  Former Cardiology Providers: None Primary Cardiologist:  Tessa Lerner, DO, [facility] (established care 08/28/2023) Electrophysiologist:  None   Referring MD: Nestor Ramp, MD  Reason of Consult: Chest Pain.   Chief Complaint  Patient presents with   Chest Pain   New Patient (Initial Visit)    History of Present Illness:    Angela Walls is a 43 y.o. African-American female whose past medical history and cardiovascular risk factors includes: HTN, Lupus (cutaneous). She is being seen today for the evaluation of chest pain at the request of Nestor Ramp, MD.  Patient is accompanied by her son at today's office visit.  She provides verbal consent with having him present during today's office encounter.  Chest pain: Started January 2025. Averaging 2-3 episodes per week. Left-sided discomfort anteriorly. Intensity 5 out of 10. Worse with exertion and prolonged walks. Improves with resting. Discomfort is nonradiating. No active chest pain  For reasons unknown she did not go to the ER over the last 2 months for more expedited evaluation.  Office blood pressures are not well-controlled.  Patient states that home blood pressures are better controlled and that she may have whitecoat hypertension.  I do not have a blood pressure log to review but patient states that home blood pressures are around 130/80 mmHg.  She is compliant with the P 10 mg p.o. daily.  No family history of premature coronary disease or sudden cardiac death.  Physical activity: Walks on a treadmill at least once a week for 15 minutes.    Current Medications: Current Meds  Medication Sig   amLODipine (NORVASC) 10 MG tablet Take 1 tablet (10 mg total) by mouth daily. NEEDS PCP APPT BEFORE LONGER FILL   aspirin EC 81 MG tablet Take 1 tablet (81 mg total) by mouth daily. Swallow whole.    diclofenac Sodium (VOLTAREN) 1 % GEL Apply 2 g topically 4 (four) times daily.   EPINEPHrine 0.3 mg/0.3 mL IJ SOAJ injection Inject 0.3 mLs (0.3 mg total) into the muscle as needed for anaphylaxis.   HYDROcodone-acetaminophen (NORCO) 10-325 MG tablet Take 1 tablet by mouth every 6 (six) hours as needed for severe pain (pain score 7-10).   naproxen sodium (ALEVE) 220 MG tablet Take 220 mg by mouth.   VITAMIN D, CHOLECALCIFEROL, PO Take by mouth.   [DISCONTINUED] metoprolol tartrate (LOPRESSOR) 25 MG tablet Take 1 tablet (25 mg total) by mouth 2 (two) times daily.     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 On chronic narcotics for this.   Hypertension    Lupus    Followed by Rheum. On chronic Prednisone.   SOB (shortness of breath)     Past Surgical History: Past Surgical History:  Procedure Laterality Date   APPENDECTOMY  2001   BREAST CYST EXCISION Left 08/31/2018   Procedure: EXCISION LEFT BREAST CYST;  Surgeon: Abigail Miyamoto, MD;  Location: [facility] OR;  Service: General;  Laterality: Left;   CESAREAN SECTION  2010   LIPOMA EXCISION Right 08/31/2018   Procedure: Excision right arm cyst;  Surgeon: Abigail Miyamoto, MD;  Location: [facility] OR;  Service: General;  Laterality: Right;  Social History: Social History   Tobacco Use   Smoking status: Never    Passive exposure: Never   Smokeless tobacco: Never  Vaping Use   Vaping status: Never Used  Substance Use Topics   Alcohol use: No   Drug use: No    Family History: Family History  Problem Relation Age of Onset   Hypertension Father    Hypertension Maternal Grandmother    Hypertension Paternal Grandmother    Stroke Neg Hx    Heart disease Neg Hx    Cancer Neg Hx    Breast cancer Neg Hx     ROS:   Review of Systems  Constitutional: Positive for malaise/fatigue.  Cardiovascular:   Positive for chest pain. Negative for claudication, irregular heartbeat, leg swelling, near-syncope, orthopnea, palpitations, paroxysmal nocturnal dyspnea and syncope.  Respiratory:  Negative for shortness of breath.   Hematologic/Lymphatic: Negative for bleeding problem.    EKGs/Labs/Other Studies Reviewed:   EKG: EKG Interpretation Date/Time:  Thursday August 28 2023 14:28:41 EST Ventricular Rate:  67 PR Interval:  174 QRS Duration:  82 QT Interval:  394 QTC Calculation: 416 R Axis:   18  Text Interpretation: Normal sinus rhythm When compared with ECG of 19-May-2023 11:58, Nonspecific T wave abnormality no longer evident in Lateral leads Confirmed by Tessa Lerner [phone]) on 08/28/2023 2:33:15 PM  Echocardiogram: None  Labs:    Latest Ref Rng & Units 08/25/2018   10:05 AM 12/16/2017   11:12 AM 04/28/2015   10:13 AM  CBC  WBC 4.0 - 10.5 K/uL 5.2  5.0  6.8   Hemoglobin 12.0 - 15.0 g/dL 91.4  78.2  95.6   Hematocrit 36.0 - 46.0 % 33.6  33.8  34.4   Platelets 150 - 400 K/uL 334  368  347        Latest Ref Rng & Units 07/08/2022    2:05 PM 12/07/2018    2:46 PM 12/16/2017   11:12 AM  BMP  Glucose 70 - 99 mg/dL 86  88  62   BUN 6 - 24 mg/dL 12  13  9    Creatinine 0.57 - 1.00 mg/dL 2.13  0.86  5.78   BUN/Creat Ratio 9 - 23 13  14  11    Sodium 134 - 144 mmol/L 140  138  141   Potassium 3.5 - 5.2 mmol/L 3.8  4.1  3.8   Chloride 96 - 106 mmol/L 106  105  107   CO2 20 - 29 mmol/L 21  20  21    Calcium 8.7 - 10.2 mg/dL 9.2  9.2  9.1       Latest Ref Rng & Units 07/08/2022    2:05 PM 12/07/2018    2:46 PM 12/16/2017   11:12 AM  CMP  Glucose 70 - 99 mg/dL 86  88  62   BUN 6 - 24 mg/dL 12  13  9    Creatinine 0.57 - 1.00 mg/dL 4.69  6.29  5.28   Sodium 134 - 144 mmol/L 140  138  141   Potassium 3.5 - 5.2 mmol/L 3.8  4.1  3.8   Chloride 96 - 106 mmol/L 106  105  107   CO2 20 - 29 mmol/L 21  20  21    Calcium 8.7 - 10.2 mg/dL 9.2  9.2  9.1   Total Protein 6.0 - 8.5 g/dL   6.4   Total  Bilirubin 0.0 - 1.2 mg/dL   0.3   Alkaline Phos 39 - 117  IU/L   56   AST 0 - 40 IU/L   15   ALT 0 - 32 IU/L   7     Lab Results  Component Value Date   CHOL 172 09/25/2007   HDL 49 09/25/2007   LDLCALC 107 (H) 09/25/2007   TRIG 79 09/25/2007   CHOLHDL 3.5 Ratio 09/25/2007   No results for input(s): "LIPOA" in the last 8760 hours. No components found for: "NTPROBNP" No results for input(s): "PROBNP" in the last 8760 hours. No results for input(s): "TSH" in the last 8760 hours.  Physical Exam:    Today's Vitals   08/28/23 1426 08/28/23 1430  BP: (!) 150/90 (!) 140/90  Pulse: 73 67  Resp: 16   SpO2: 99%   Weight: 180 lb (81.6 kg)   Height: 5\' 5"  (1.651 m)    Body mass index is 29.95 kg/m. Wt Readings from Last 3 Encounters:  08/28/23 180 lb (81.6 kg)  06/16/23 185 lb 6.4 oz (84.1 kg)  05/19/23 187 lb 3.2 oz (84.9 kg)    Physical Exam  Constitutional: No distress.  hemodynamically stable  Neck: No JVD present.  Cardiovascular: Normal rate, regular rhythm, S1 normal and S2 normal. Exam reveals no gallop, no S3 and no S4.  No murmur heard. Pulmonary/Chest: Effort normal and breath sounds normal. No stridor. She has no wheezes. She has no rales.  Musculoskeletal:        General: No edema.     Cervical back: Neck supple.  Skin: Skin is warm.   Impression & Recommendation(s):  Impression:   ICD-10-CM   1. Precordial pain  R07.2 EKG 12-Lead    ECHOCARDIOGRAM COMPLETE    CT CORONARY MORPH W/CTA COR W/SCORE W/CA W/CM &/OR WO/CM    Basic Metabolic Panel (BMET)    2. Benign hypertension  I10 Basic Metabolic Panel (BMET)    3. Anemia, unspecified type  D64.9        Recommendation(s):  Precordial pain Exertional chest pain since January 2025, located on the left side, worsens with exertion and improves with rest. No syncope. -Order Echocardiogram and Coronary CTA to evaluate for possible coronary artery disease. -EKG today is nonischemic. -Start Metoprolol 25mg  BID  now for symptom control. -If pain worsens or becomes more frequent, patient advised to go to the hospital. -Recommend starting aspirin 81 mg p.o. daily until the workup is complete. -Reemphasized importance of blood pressure management. -Patient has not had her lipids checked since 2009, patient states that she will follow-up with PCP.   Benign essential hypertension Currently on amlodipine 10 mg p.o. daily. Office blood pressures are not at goal. States home blood pressures are better controlled. She follows up with PCP in the coming weeks for a repeat blood pressure check. Advised her to keep a log of her blood pressures and to review with PCP to see if additional medication titration is warranted. Reemphasized importance of low-salt diet.  Anemia History of low hemoglobin, recent status unknown. -She will discuss this further with PCP at the upcoming visit.  Orders Placed:  Orders Placed This Encounter  Procedures   CT CORONARY MORPH W/CTA COR W/SCORE W/CA W/CM &/OR WO/CM    Standing Status:   Future    Expected Date:   08/28/2023    Expiration Date:   08/27/2024    If indicated for the ordered procedure, I authorize the administration of contrast media per Radiology protocol:   Yes    Initiate Coronary CTA Adult Protocol:  Yes    If indicated initiate Post Coronary CTA Hypotension Adult Protocol:   Yes    Does the patient have a contrast media/X-ray dye allergy?:   No    Is patient pregnant?:   No    Preferred Imaging Location?:   [facility]    Authorization::   FFR will be ordered if deemed medically necessary   Basic Metabolic Panel (BMET)    Standing Status:   Future    Number of Occurrences:   1    Expected Date:   08/28/2023    Expiration Date:   08/27/2024   EKG 12-Lead   ECHOCARDIOGRAM COMPLETE    Standing Status:   Future    Expected Date:   09/04/2023    Expiration Date:   08/27/2024    Where should this test be performed:   [facility])     Does the patient weigh less than or greater than 250 lbs?:   Patient weighs less than 250 lbs    Perflutren DEFINITY (image enhancing agent) should be administered unless hypersensitivity or allergy exist:   Administer Perflutren    Reason for exam-Echo:   Chest Pain  R07.9   Final Medication List:    Meds ordered this encounter  Medications   aspirin EC 81 MG tablet    Sig: Take 1 tablet (81 mg total) by mouth daily. Swallow whole.   DISCONTD: metoprolol tartrate (LOPRESSOR) 25 MG tablet    Sig: Take 1 tablet (25 mg total) by mouth 2 (two) times daily.    Dispense:  180 tablet    Refill:  3   metoprolol tartrate (LOPRESSOR) 25 MG tablet    Sig: Take 1 tablet (25 mg total) by mouth 2 (two) times daily.    Dispense:  60 tablet    Refill:  3    Medications Discontinued During This Encounter  Medication Reason   metoprolol tartrate (LOPRESSOR) 25 MG tablet      Current Outpatient Medications:    amLODipine (NORVASC) 10 MG tablet, Take 1 tablet (10 mg total) by mouth daily. NEEDS PCP APPT BEFORE LONGER FILL, Disp: 30 tablet, Rfl: 0   aspirin EC 81 MG tablet, Take 1 tablet (81 mg total) by mouth daily. Swallow whole., Disp: , Rfl:    diclofenac Sodium (VOLTAREN) 1 % GEL, Apply 2 g topically 4 (four) times daily., Disp: 50 g, Rfl: 0   EPINEPHrine 0.3 mg/0.3 mL IJ SOAJ injection, Inject 0.3 mLs (0.3 mg total) into the muscle as needed for anaphylaxis., Disp: 1 each, Rfl: 0   HYDROcodone-acetaminophen (NORCO) 10-325 MG tablet, Take 1 tablet by mouth every 6 (six) hours as needed for severe pain (pain score 7-10)., Disp: 120 tablet, Rfl: 0   naproxen sodium (ALEVE) 220 MG tablet, Take 220 mg by mouth., Disp: , Rfl:    VITAMIN D, CHOLECALCIFEROL, PO, Take by mouth., Disp: , Rfl:    metoprolol tartrate (LOPRESSOR) 25 MG tablet, Take 1 tablet (25 mg total) by mouth 2 (two) times daily., Disp: 60 tablet, Rfl: 3  Consent:   NA  Disposition:   8-week follow-up sooner if needed  Her  questions and concerns were addressed to her satisfaction. She voices understanding of the recommendations provided during this encounter.    Signed, Tessa Lerner, DO, [facility]  [facility]  [address] [place], [place] [address] 08/31/2023 12:06 PM

## 2023-09-02 ENCOUNTER — Telehealth: Payer: Self-pay | Admitting: Student

## 2023-09-02 ENCOUNTER — Encounter: Payer: Self-pay | Admitting: Student

## 2023-09-02 DIAGNOSIS — Z79899 Other long term (current) drug therapy: Secondary | ICD-10-CM

## 2023-09-02 NOTE — Telephone Encounter (Signed)
 Called patient x 2. Dr. Lum Babe present. No answer. Left HIPAA compliant VM that I will send mychart message.

## 2023-09-11 ENCOUNTER — Encounter (HOSPITAL_COMMUNITY): Payer: Self-pay

## 2023-09-15 ENCOUNTER — Ambulatory Visit (HOSPITAL_COMMUNITY): Admission: RE | Admit: 2023-09-15 | Source: Ambulatory Visit

## 2023-09-17 ENCOUNTER — Telehealth: Admitting: Physician Assistant

## 2023-09-17 ENCOUNTER — Ambulatory Visit (HOSPITAL_COMMUNITY)

## 2023-09-17 DIAGNOSIS — S99929A Unspecified injury of unspecified foot, initial encounter: Secondary | ICD-10-CM

## 2023-09-17 NOTE — Progress Notes (Signed)
   Thank you for the details you included in the comment boxes. Those details are very helpful in determining the best course of treatment for you and help [place] to provide the best care. We recommend that you schedule a Virtual Urgent Care video visit in order for the provider to better assess what is going on.  The provider will be able to give you a more accurate diagnosis and treatment plan if we can more freely discuss your symptoms and with the addition of a virtual examination.   If you change your visit to a video visit, we will bill your insurance (similar to an office visit) and you will not be charged for this e-Visit. You will be able to stay at home and speak with the first available [facility] advanced practice provider. The link to do a video visit is in the drop down Menu tab of your Welcome screen in MyChart.       I have spent 5 minutes in review of e-visit questionnaire, review and updating patient chart, medical decision making and response to patient.   Margaretann Loveless, PA-C

## 2023-09-22 ENCOUNTER — Ambulatory Visit (INDEPENDENT_AMBULATORY_CARE_PROVIDER_SITE_OTHER): Admitting: Family Medicine

## 2023-09-22 VITALS — BP 125/75 | Ht 65.0 in | Wt 176.2 lb

## 2023-09-22 DIAGNOSIS — I10 Essential (primary) hypertension: Secondary | ICD-10-CM | POA: Diagnosis not present

## 2023-09-22 DIAGNOSIS — M87051 Idiopathic aseptic necrosis of right femur: Secondary | ICD-10-CM | POA: Diagnosis not present

## 2023-09-22 DIAGNOSIS — Z0000 Encounter for general adult medical examination without abnormal findings: Secondary | ICD-10-CM

## 2023-09-22 DIAGNOSIS — Z79891 Long term (current) use of opiate analgesic: Secondary | ICD-10-CM | POA: Diagnosis not present

## 2023-09-22 NOTE — Assessment & Plan Note (Signed)
 Controlled.  Continue amlodipine

## 2023-09-22 NOTE — Patient Instructions (Signed)
 We renewed your pain contract today and collected a urine sample. I will send a message to Dr. Laroy Apple about our visit today. I will also send a message to our clinic leadership to help with prescriber number issues as you and Dr. Laroy Apple wean off the opioid medications. I am happy that you are connected with orthopedics to help with your hip! Let us know if you have any other questions.

## 2023-09-22 NOTE — Progress Notes (Signed)
  SUBJECTIVE:   CHIEF COMPLAINT / HPI:   Toe injury Was walking last week in her home and accidentally stumped her fourth toe on the left foot. It was painful at the time. It is no longer painful. It does seem darker now under the nail. There has not been any pus or continued bleeding; the skin was not broken. No spreading redness or fevers.  Norco refills Was asked by her PCP to come in to discuss refills for Norco. She has been on this for over 5 years. She takes it due to SLE and avascular necrosis of the R hip. She also has issues with her shoulder and ankle.  She has been having a lot of difficulty obtaining her medications due to problems with the prescriber number, per the pharmacy.  PERTINENT  PMH / PSH: Hypertension, avascular necrosis of femur head, SLE, chronic pain  OBJECTIVE:   BP 125/75   Ht 5\' 5"  (1.651 m)   Wt 176 lb 3.2 oz (79.9 kg)   SpO2 97%   BMI 29.32 kg/m   General: Alert and oriented, in NAD Skin: Warm, dry, and intact HEENT: NCAT, EOM grossly normal, midline nasal septum Cardiac: RRR, no m/r/g appreciated on my exam Respiratory: CTAB, breathing and speaking comfortably on RA Abdominal: Soft, nontender, nondistended, normoactive bowel sounds Extremities/MSK: Moves all extremities grossly equally, minimal tenderness to palpation over entire right hip Neurological: No gross focal deficit, gait normal Psychiatric: Appropriate mood and affect  ASSESSMENT/PLAN:   Assessment & Plan Avascular necrosis of femur head, right (HCC) Patient appears comfortable on exam.  Plan for surgical intervention with orthopedics when finances allow. Chronic prescription opiate use Updated pain contract today with patient and scanned into chart.  Advised that I would follow-up with PCP regarding her visit today for further opioid refills.  Discussed long-term goal of weaning the dosage and coming in for check-in's every 1 to 3 months with PCP.  Will also send message to clinic  leadership to hopefully correct any issues and prescriber numbers so she can get her prescriptions in a timely manner.  Urine toxicology screening collected today. Patient very understanding and appreciative of plan. HYPERTENSION, BENIGN SYSTEMIC Controlled.  Continue amlodipine. Health maintenance examination Patient politely declines COVID-19 vaccination today.  Janeal Holmes, MD [facility]

## 2023-09-22 NOTE — Assessment & Plan Note (Addendum)
 Patient appears comfortable on exam.  Plan for surgical intervention with orthopedics when finances allow.

## 2023-09-23 ENCOUNTER — Telehealth: Payer: Self-pay

## 2023-09-23 DIAGNOSIS — G8929 Other chronic pain: Secondary | ICD-10-CM

## 2023-09-23 MED ORDER — HYDROCODONE-ACETAMINOPHEN 10-325 MG PO TABS: 1.0000 | ORAL_TABLET | Freq: Four times a day (QID) | ORAL | 0 refills | Status: AC | PRN

## 2023-09-23 NOTE — Telephone Encounter (Signed)
 Patient Angela Walls on nurse line in regards to her pain medication.   She reports when she left the office yesterday she was under the impression her Norco would be sent in.   Will forward to provider who saw patient. Appears a tox screen was done, unsure if we are waiting on results before prescribing.

## 2023-09-23 NOTE — Telephone Encounter (Signed)
 Discussed with attending Dr. Miquel Dunn. OK to fill Norco without results of UDS. Will start wean with 10 tablets decrease per month. Patient will need 1 month follow up with me (PCP), routing to [facility] team.

## 2023-09-24 NOTE — Telephone Encounter (Signed)
 Called patient to set up a 1 month f/u patient stated that she does not see a reason of coming to that appointment. Patient stated that she has reached out to you through [facility] and that you was informing her that she will have to go to a pain management clinic. Patient did not want to set up an appointment to follow up being that she was just here yesterday she stated.

## 2023-09-25 LAB — TOXASSURE SELECT 13 (MW), URINE

## 2023-10-15 ENCOUNTER — Ambulatory Visit (HOSPITAL_COMMUNITY): Attending: Cardiovascular Disease

## 2023-10-15 DIAGNOSIS — R072 Precordial pain: Secondary | ICD-10-CM | POA: Insufficient documentation

## 2023-10-15 LAB — ECHOCARDIOGRAM COMPLETE
Area-P 1/2: 3.75 cm2
S' Lateral: 2.8 cm

## 2023-10-16 ENCOUNTER — Encounter (HOSPITAL_COMMUNITY): Payer: Self-pay

## 2023-10-20 ENCOUNTER — Other Ambulatory Visit (HOSPITAL_COMMUNITY)

## 2023-10-21 DIAGNOSIS — M25551 Pain in right hip: Secondary | ICD-10-CM | POA: Diagnosis not present

## 2023-10-21 DIAGNOSIS — Z3200 Encounter for pregnancy test, result unknown: Secondary | ICD-10-CM | POA: Diagnosis not present

## 2023-10-21 DIAGNOSIS — Z79899 Other long term (current) drug therapy: Secondary | ICD-10-CM | POA: Diagnosis not present

## 2023-10-21 DIAGNOSIS — M129 Arthropathy, unspecified: Secondary | ICD-10-CM | POA: Diagnosis not present

## 2023-10-21 DIAGNOSIS — Z131 Encounter for screening for diabetes mellitus: Secondary | ICD-10-CM | POA: Diagnosis not present

## 2023-10-21 DIAGNOSIS — E559 Vitamin D deficiency, unspecified: Secondary | ICD-10-CM | POA: Diagnosis not present

## 2023-10-21 DIAGNOSIS — R030 Elevated blood-pressure reading, without diagnosis of hypertension: Secondary | ICD-10-CM | POA: Diagnosis not present

## 2023-10-21 DIAGNOSIS — Z6828 Body mass index (BMI) 28.0-28.9, adult: Secondary | ICD-10-CM | POA: Diagnosis not present

## 2023-10-21 DIAGNOSIS — M25512 Pain in left shoulder: Secondary | ICD-10-CM | POA: Diagnosis not present

## 2023-10-22 ENCOUNTER — Encounter (HOSPITAL_COMMUNITY): Payer: Self-pay

## 2023-10-23 ENCOUNTER — Ambulatory Visit (HOSPITAL_COMMUNITY)
Admission: RE | Admit: 2023-10-23 | Discharge: 2023-10-23 | Disposition: A | Discharge status: Home / Self Care | Source: Ambulatory Visit | Attending: Cardiology | Admitting: Cardiology

## 2023-10-23 ENCOUNTER — Encounter (HOSPITAL_COMMUNITY): Payer: Self-pay

## 2023-10-23 DIAGNOSIS — R931 Abnormal findings on diagnostic imaging of heart and coronary circulation: Secondary | ICD-10-CM | POA: Diagnosis present

## 2023-10-23 DIAGNOSIS — I2510 Atherosclerotic heart disease of native coronary artery without angina pectoris: Secondary | ICD-10-CM

## 2023-10-23 DIAGNOSIS — R072 Precordial pain: Secondary | ICD-10-CM | POA: Diagnosis present

## 2023-10-23 MED ORDER — NITROGLYCERIN 0.4 MG SL SUBL
0.8000 mg | SUBLINGUAL_TABLET | Freq: Once | SUBLINGUAL | Status: AC
  Administered 2023-10-23: 0.8 mg via SUBLINGUAL

## 2023-10-23 MED ORDER — NITROGLYCERIN 0.4 MG SL SUBL
SUBLINGUAL_TABLET | SUBLINGUAL | Status: AC
  Filled 2023-10-23: qty 2

## 2023-10-23 MED ORDER — IOHEXOL 350 MG/ML SOLN
100.0000 mL | Freq: Once | INTRAVENOUS | Status: AC | PRN
Start: 2023-10-23 — End: 2023-10-23
  Administered 2023-10-23: 100 mL via INTRAVENOUS

## 2023-10-24 ENCOUNTER — Other Ambulatory Visit: Payer: Self-pay | Admitting: Cardiology

## 2023-10-24 ENCOUNTER — Ambulatory Visit (HOSPITAL_COMMUNITY)
Admission: RE | Admit: 2023-10-24 | Discharge: 2023-10-24 | Disposition: A | Discharge status: Home / Self Care | Payer: Self-pay | Source: Ambulatory Visit | Attending: Cardiology | Admitting: Cardiology

## 2023-10-24 ENCOUNTER — Ambulatory Visit: Admitting: Cardiology

## 2023-10-24 ENCOUNTER — Other Ambulatory Visit: Payer: Self-pay

## 2023-10-24 DIAGNOSIS — R931 Abnormal findings on diagnostic imaging of heart and coronary circulation: Secondary | ICD-10-CM

## 2023-10-24 DIAGNOSIS — R072 Precordial pain: Secondary | ICD-10-CM

## 2023-10-24 DIAGNOSIS — Z79899 Other long term (current) drug therapy: Secondary | ICD-10-CM | POA: Diagnosis not present

## 2023-10-24 MED ORDER — ROSUVASTATIN CALCIUM 20 MG PO TABS: 20.0000 mg | ORAL_TABLET | Freq: Every day | ORAL | 3 refills | Status: AC

## 2023-10-27 ENCOUNTER — Ambulatory Visit: Attending: Cardiology | Admitting: Cardiology

## 2023-10-27 ENCOUNTER — Encounter: Payer: Self-pay | Admitting: Cardiology

## 2023-10-27 VITALS — BP 160/82 | HR 62 | Resp 16 | Ht 65.0 in | Wt 176.0 lb

## 2023-10-27 DIAGNOSIS — I10 Essential (primary) hypertension: Secondary | ICD-10-CM | POA: Diagnosis not present

## 2023-10-27 DIAGNOSIS — I25118 Atherosclerotic heart disease of native coronary artery with other forms of angina pectoris: Secondary | ICD-10-CM | POA: Insufficient documentation

## 2023-10-27 DIAGNOSIS — R072 Precordial pain: Secondary | ICD-10-CM | POA: Diagnosis not present

## 2023-10-27 MED ORDER — NITROGLYCERIN 0.4 MG SL SUBL: 0.4000 mg | SUBLINGUAL_TABLET | SUBLINGUAL | 0 refills | Status: AC | PRN

## 2023-10-27 NOTE — Progress Notes (Signed)
 Cardiology Office Note:    Date:  10/27/2023  NAME:  Angela Walls    MRN: [id number] DOB:  [birth year]   PCP:  Genora Kidd, MD  Former Cardiology Providers: None Primary Cardiologist:  Olinda Bertrand, DO, [facility] (established care 08/28/2023) Electrophysiologist:  None   Chief Complaint  Patient presents with   Results   Follow-up    Re-evaluate CP     History of Present Illness:    Angela Walls is a 43 y.o. African-American female whose past medical history and cardiovascular risk factors includes: HTN, Lupus (cutaneous). She is being seen today for the evaluation of chest pain at the request of Genora Kidd, MD.  Patient was referred to the practice for evaluation of precordial pain.  She has been experiencing exertional chest pain since January 2025 which is felt to be predominantly cardiac.  At the last office visit was recommended to undergo echo and coronary CTA.  She was called and earlier than scheduled office visit today to review her coronary CTA results and to be discussed her symptoms.  Patient is that she still has exertional chest pain but overall intensity frequency and duration has not changed much.  She denies heart failure symptoms.  Last Friday she had a coronary CTA which noted moderate disease in the second obtuse marginal branch predominately due to soft plaque.  CT FFR was positive for hemodynamically significant stenosis.  She was asked to start Lopressor  25 mg p.o. twice daily and rosuvastatin 20 mg p.o. nightly.  And to seek medical attention if she has ongoing precordial pain but going to the closest ER via EMS.  Over the weekend she has remained relatively stable.  She had fasting lipids earlier this morning.  And plans to pick up her Lopressor  and rosuvastatin later today.  No family history of premature coronary disease or sudden cardiac death.  Physical activity: Walks on a treadmill at least once a week for 15 minutes.    Current  Medications: Current Meds  Medication Sig   amLODipine  (NORVASC ) 10 MG tablet Take 1 tablet (10 mg total) by mouth daily. NEEDS PCP APPT BEFORE LONGER FILL   aspirin  EC 81 MG tablet Take 1 tablet (81 mg total) by mouth daily. Swallow whole.   diclofenac  Sodium (VOLTAREN ) 1 % GEL Apply 2 g topically 4 (four) times daily.   EPINEPHrine  0.3 mg/0.3 mL IJ SOAJ injection Inject 0.3 mLs (0.3 mg total) into the muscle as needed for anaphylaxis.   HYDROcodone -acetaminophen  (NORCO) 10-325 MG tablet Take 1 tablet by mouth every 6 (six) hours as needed for severe pain (pain score 7-10).   metoprolol  tartrate (LOPRESSOR ) 25 MG tablet Take 1 tablet (25 mg total) by mouth 2 (two) times daily.   naproxen  sodium (ALEVE ) 220 MG tablet Take 220 mg by mouth.   nitroGLYCERIN  (NITROSTAT ) 0.4 MG SL tablet Place 1 tablet (0.4 mg total) under the tongue every 5 (five) minutes as needed for chest pain.   rosuvastatin (CRESTOR) 20 MG tablet Take 1 tablet (20 mg total) by mouth at bedtime.   VITAMIN D, CHOLECALCIFEROL, PO Take by mouth.     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 On chronic narcotics for this.   Hypertension    Lupus    Followed by Rheum. On chronic Prednisone .   SOB (shortness of breath)  Past Surgical History: Past Surgical History:  Procedure Laterality Date   APPENDECTOMY  2001   BREAST CYST EXCISION Left 08/31/2018   Procedure: EXCISION LEFT BREAST CYST;  Surgeon: Oza Blumenthal, MD;  Location: [facility] OR;  Service: General;  Laterality: Left;   CESAREAN SECTION  2010   LIPOMA EXCISION Right 08/31/2018   Procedure: Excision right arm cyst;  Surgeon: Oza Blumenthal, MD;  Location: [facility] OR;  Service: General;  Laterality: Right;    Social History: Social History   Tobacco Use   Smoking status: Never    Passive exposure: Never   Smokeless tobacco: Never   Vaping Use   Vaping status: Never Used  Substance Use Topics   Alcohol use: No   Drug use: No    Family History: Family History  Problem Relation Age of Onset   Hypertension Father    Hypertension Maternal Grandmother    Hypertension Paternal Grandmother    Stroke Neg Hx    Heart disease Neg Hx    Cancer Neg Hx    Breast cancer Neg Hx     ROS:   Review of Systems  Constitutional: Positive for malaise/fatigue.  Cardiovascular:  Positive for chest pain. Negative for claudication, irregular heartbeat, leg swelling, near-syncope, orthopnea, palpitations, paroxysmal nocturnal dyspnea and syncope.  Respiratory:  Negative for shortness of breath.   Hematologic/Lymphatic: Negative for bleeding problem.    EKGs/Labs/Other Studies Reviewed:   EKG: EKG Interpretation Date/Time:  Monday Oct 27 2023 08:36:52 EDT Ventricular Rate:  61 PR Interval:  172 QRS Duration:  82 QT Interval:  416 QTC Calculation: 418 R Axis:   58  Text Interpretation: Normal sinus rhythm with sinus arrhythmia Nonspecific ST abnormality When compared with ECG of 28-Aug-2023 14:28, No significant change was found Confirmed by Olinda Bertrand [phone]) on 10/27/2023 8:52:58 AM  Echocardiogram: 10/15/2023  1. Left ventricular ejection fraction, by estimation, is 55 to 60%. Left  ventricular ejection fraction by 3D volume is 56 %. The left ventricle has  normal function. The left ventricle has no regional wall motion  abnormalities. Left ventricular diastolic   parameters were normal. The average left ventricular global longitudinal  strain is -24.7 %. The global longitudinal strain is normal.   2. Right ventricular systolic function is normal. The right ventricular  size is normal. There is normal pulmonary artery systolic pressure. The  estimated right ventricular systolic pressure is 18.1 mmHg.   3. Left atrial size was mildly dilated.   4. The mitral valve is normal in structure. Moderate mitral valve   regurgitation. No evidence of mitral stenosis.   5. The aortic valve is tricuspid. Aortic valve regurgitation is not  visualized. No aortic stenosis is present.   6. The inferior vena cava is normal in size with greater than 50%  respiratory variability, suggesting right atrial pressure of 3 mmHg.   CCTA: 10/23/2023 1. Coronary calcium score of 0. This was 1st percentile for age and sex matched control.   2. Total plaque volume 129 mm3 which is 93rd percentile for age- and sex-matched controls. TPV is moderate.   3. Normal coronary origins with right dominance.   4. CAD-RADS 3 Moderate CAD.   5. CT FFR will be performed and reported separately - further evaluate the LCx/OM.  6.  Radiology overread: Pending   RECOMMENDATION: Consider symptom-guided anti-ischemic pharmacotherapy as well as risk factor modification per guideline directed care. Additional analysis with CT FFR will be submitted.  CT-FFR 10/23/2023: 1. Left Main: FFR = 0.98  2. LAD: Proximal FFR = 0.94, Mid FFR = 0.90, Distal FFR = 0.84 3. LCX: Proximal FFR = 0.97, Distal FFR = 0.91 4. OM1: Not modeled due to small caliber vessel 5. OM2: Proximal FFR = 0.90, Distal FFR = 0.61 6. RCA: Proximal FFR = 1.0, Mid FFR =0.93, Distal FFR = 0.92   IMPRESSION: 1. CT FFR analysis shows significant stenosis within the proximal segment of the second obtuse marginal branch (OM2).   RECOMMENDATIONS: Guideline-directed medical therapy and aggressive risk factor modification for secondary prevention of coronary artery disease.   If symptoms refractory to medical therapy strongly consider invasive angiography for further evaluation. Clinical correlation required.  Labs:    Latest Ref Rng & Units 08/25/2018   10:05 AM 12/16/2017   11:12 AM 04/28/2015   10:13 AM  CBC  WBC 4.0 - 10.5 K/uL 5.2  5.0  6.8   Hemoglobin 12.0 - 15.0 g/dL 14.7  82.9  56.2   Hematocrit 36.0 - 46.0 % 33.6  33.8  34.4   Platelets 150 - 400 K/uL 334  368  347         Latest Ref Rng & Units 07/08/2022    2:05 PM 12/07/2018    2:46 PM 12/16/2017   11:12 AM  BMP  Glucose 70 - 99 mg/dL 86  88  62   BUN 6 - 24 mg/dL 12  13  9    Creatinine 0.57 - 1.00 mg/dL 1.30  8.65  7.84   BUN/Creat Ratio 9 - 23 13  14  11    Sodium 134 - 144 mmol/L 140  138  141   Potassium 3.5 - 5.2 mmol/L 3.8  4.1  3.8   Chloride 96 - 106 mmol/L 106  105  107   CO2 20 - 29 mmol/L 21  20  21    Calcium 8.7 - 10.2 mg/dL 9.2  9.2  9.1       Latest Ref Rng & Units 07/08/2022    2:05 PM 12/07/2018    2:46 PM 12/16/2017   11:12 AM  CMP  Glucose 70 - 99 mg/dL 86  88  62   BUN 6 - 24 mg/dL 12  13  9    Creatinine 0.57 - 1.00 mg/dL 6.96  2.95  2.84   Sodium 134 - 144 mmol/L 140  138  141   Potassium 3.5 - 5.2 mmol/L 3.8  4.1  3.8   Chloride 96 - 106 mmol/L 106  105  107   CO2 20 - 29 mmol/L 21  20  21    Calcium 8.7 - 10.2 mg/dL 9.2  9.2  9.1   Total Protein 6.0 - 8.5 g/dL   6.4   Total Bilirubin 0.0 - 1.2 mg/dL   0.3   Alkaline Phos 39 - 117 IU/L   56   AST 0 - 40 IU/L   15   ALT 0 - 32 IU/L   7     Lab Results  Component Value Date   CHOL 172 09/25/2007   HDL 49 09/25/2007   LDLCALC 107 (H) 09/25/2007   TRIG 79 09/25/2007   CHOLHDL 3.5 Ratio 09/25/2007   No results for input(s): "LIPOA" in the last 8760 hours. No components found for: "NTPROBNP" No results for input(s): "PROBNP" in the last 8760 hours. No results for input(s): "TSH" in the last 8760 hours.  Physical Exam:    Today's Vitals   10/27/23 0833  BP: (!) 160/82  Pulse: 62  Resp: 16  SpO2: 99%  Weight: 176 lb (79.8 kg)  Height: 5\' 5"  (1.651 m)    Body mass index is 29.29 kg/m. Wt Readings from Last 3 Encounters:  10/27/23 176 lb (79.8 kg)  09/22/23 176 lb 3.2 oz (79.9 kg)  08/28/23 180 lb (81.6 kg)    Physical Exam  Constitutional: No distress.  hemodynamically stable  Neck: No JVD present.  Cardiovascular: Normal rate, regular rhythm, S1 normal and S2 normal. Exam reveals no gallop, no S3  and no S4.  No murmur heard. Pulmonary/Chest: Effort normal and breath sounds normal. No stridor. She has no wheezes. She has no rales.  Musculoskeletal:        General: No edema.     Cervical back: Neck supple.  Skin: Skin is warm.   Impression & Recommendation(s):  Impression:   ICD-10-CM   1. Atherosclerosis of native coronary artery of native heart with other form of angina pectoris (HCC)  I25.118 EKG 12-Lead    2. Precordial pain  R07.2     3. Benign hypertension  I10       Recommendation(s):  Coronary artery disease with chest pain Exertional chest pain since January 2025, located on the left side, worsens with exertion and improves with rest.  Overall intensity, frequency, and duration has not changed. Patient is medication nave. Results of the coronary CTA discussed with the patient.  Majority the disease is localized to the second obtuse marginal branch and distal LCx distribution.  Given the fact that she is symptomatic recommended left heart catheterization to reevaluate LCx distribution and consider angioplasty/stenting if vessel size and anatomy is suitable for intervention.   However, patient would like to proceed forward with medical management and if refractory to medical therapy she is agreeable to undergo heart catheterization.   All of his blood pressures are not well-controlled today as she has not taken her morning medications. Patient is advised to keep a log of her blood pressures and to see if further medication titration is warranted. Start Lopressor  25 mg p.o. twice daily for now and will transition to Toprol -XL at the next visit. Start Crestor 20 mg p.o. nightly. Baseline fasting lipids were checked earlier this morning  Sublingual nitroglycerin  tablets to use on as needed basis. Patient is advised that she can take the sublingual nitroglycerin  tablets 5 minutes apart; however, if she is requiring more than 2 tablets he should go to the closest ER via EMS  for further evaluation and management.   Patient agreeable with the plan of care and provides verbal feedback.   Echocardiogram results reviewed.    Benign essential hypertension Chronic Office blood pressures are not well-controlled, she is n.p.o. and has not taken her morning medications. Currently on amlodipine  10 mg p.o. daily. Advised her to keep a log of her blood pressures and to see if further medication titration is warranted, she will follow-up with PCP.    Patient called in sooner to review her coronary CTA results, discussed proceeding forward left heart catheterization with possible intervention; the patient refused at the current time, discussed uptitration of antianginal/GDMT, prescription drug management, discussed symptoms that would warrant more immediate evaluation but go to the closest ER via EMS, coordination of care, we will see her back in close follow-up in 8 weeks sooner if needed  Orders Placed:  Orders Placed This Encounter  Procedures   EKG 12-Lead   Final Medication List:    Meds ordered this encounter  Medications   nitroGLYCERIN  (NITROSTAT ) 0.4 MG SL  tablet    Sig: Place 1 tablet (0.4 mg total) under the tongue every 5 (five) minutes as needed for chest pain.    Dispense:  30 tablet    Refill:  0    There are no discontinued medications.    Current Outpatient Medications:    amLODipine  (NORVASC ) 10 MG tablet, Take 1 tablet (10 mg total) by mouth daily. NEEDS PCP APPT BEFORE LONGER FILL, Disp: 30 tablet, Rfl: 0   aspirin  EC 81 MG tablet, Take 1 tablet (81 mg total) by mouth daily. Swallow whole., Disp: , Rfl:    diclofenac  Sodium (VOLTAREN ) 1 % GEL, Apply 2 g topically 4 (four) times daily., Disp: 50 g, Rfl: 0   EPINEPHrine  0.3 mg/0.3 mL IJ SOAJ injection, Inject 0.3 mLs (0.3 mg total) into the muscle as needed for anaphylaxis., Disp: 1 each, Rfl: 0   HYDROcodone -acetaminophen  (NORCO) 10-325 MG tablet, Take 1 tablet by mouth every 6 (six) hours as  needed for severe pain (pain score 7-10)., Disp: 110 tablet, Rfl: 0   metoprolol  tartrate (LOPRESSOR ) 25 MG tablet, Take 1 tablet (25 mg total) by mouth 2 (two) times daily., Disp: 60 tablet, Rfl: 3   naproxen  sodium (ALEVE ) 220 MG tablet, Take 220 mg by mouth., Disp: , Rfl:    nitroGLYCERIN  (NITROSTAT ) 0.4 MG SL tablet, Place 1 tablet (0.4 mg total) under the tongue every 5 (five) minutes as needed for chest pain., Disp: 30 tablet, Rfl: 0   rosuvastatin (CRESTOR) 20 MG tablet, Take 1 tablet (20 mg total) by mouth at bedtime., Disp: 30 tablet, Rfl: 3   VITAMIN D, CHOLECALCIFEROL, PO, Take by mouth., Disp: , Rfl:   Consent:   NA  Disposition:   8-week follow-up sooner if needed  Her questions and concerns were addressed to her satisfaction. She voices understanding of the recommendations provided during this encounter.    Signed, Olinda Bertrand, DO, [facility]  [facility]  [address] [place], [place] [address] 10/27/2023 12:59 PM

## 2023-10-27 NOTE — Patient Instructions (Signed)
 Medication Instructions:  Your physician has recommended you make the following change in your medication:   AS NEEDED Nitroglycerin  0.4 SL The proper use and anticipated side effects of nitroglycerine has been carefully explained.  If a single episode of chest pain is not relieved by one tablet, the patient will try another within 5 minutes; and if this doesn't relieve the pain, the patient is instructed to call 911 for transportation to an emergency department.    *If you need a refill on your cardiac medications before your next appointment, please call your pharmacy*  Lab Work: None ordered today. If you have labs (blood work) drawn today and your tests are completely normal, you will receive your results only by: MyChart Message (if you have MyChart) OR A paper copy in the mail If you have any lab test that is abnormal or we need to change your treatment, we will call you to review the results.  Testing/Procedures: None ordered today.  Follow-Up: At [facility], you and your health needs are our priority.  As part of our continuing mission to provide you with exceptional heart care, we have created designated Provider Care Teams.  These Care Teams include your primary Cardiologist (physician) and Advanced Practice Providers (APPs -  Physician Assistants and Nurse Practitioners) who all work together to provide you with the care you need, when you need it.  We recommend signing up for the patient portal called "MyChart".  Sign up information is provided on this After Visit Summary.  MyChart is used to connect with patients for Virtual Visits (Telemedicine).  Patients are able to view lab/test results, encounter notes, upcoming appointments, etc.  Non-urgent messages can be sent to your provider as well.   To learn more about what you can do with MyChart, go to [email or web address].    Your next appointment:   8 week(s)  The format for your next appointment:   In  Person  Provider:   Olinda Bertrand, [facility]

## 2023-10-28 LAB — COMPREHENSIVE METABOLIC PANEL WITH GFR
ALT: 7 IU/L (ref 0–32)
AST: 11 IU/L (ref 0–40)
Albumin: 3.6 g/dL — ABNORMAL LOW (ref 3.9–4.9)
Alkaline Phosphatase: 51 IU/L (ref 44–121)
BUN/Creatinine Ratio: 17 (ref 9–23)
BUN: 13 mg/dL (ref 6–24)
Bilirubin Total: 0.2 mg/dL (ref 0.0–1.2)
CO2: 22 mmol/L (ref 20–29)
Calcium: 8.7 mg/dL (ref 8.7–10.2)
Chloride: 108 mmol/L — ABNORMAL HIGH (ref 96–106)
Creatinine, Ser: 0.78 mg/dL (ref 0.57–1.00)
Globulin, Total: 2.1 g/dL (ref 1.5–4.5)
Glucose: 91 mg/dL (ref 70–99)
Potassium: 4 mmol/L (ref 3.5–5.2)
Sodium: 140 mmol/L (ref 134–144)
Total Protein: 5.7 g/dL — ABNORMAL LOW (ref 6.0–8.5)
eGFR: 98 mL/min/{1.73_m2} (ref >59)

## 2023-10-28 LAB — LIPID PANEL
Chol/HDL Ratio: 2.9 ratio (ref 0.0–4.4)
Cholesterol, Total: 147 mg/dL (ref 100–199)
HDL: 50 mg/dL (ref >39)
LDL Chol Calc (NIH): 86 mg/dL (ref 0–99)
Triglycerides: 49 mg/dL (ref 0–149)
VLDL Cholesterol Cal: 11 mg/dL (ref 5–40)

## 2023-10-28 LAB — LDL CHOLESTEROL, DIRECT: LDL Direct: 92 mg/dL (ref 0–99)

## 2023-10-29 ENCOUNTER — Other Ambulatory Visit: Payer: Self-pay

## 2023-10-29 DIAGNOSIS — I25118 Atherosclerotic heart disease of native coronary artery with other forms of angina pectoris: Secondary | ICD-10-CM

## 2023-10-29 NOTE — Telephone Encounter (Signed)
 Noted  Patient reviewed MyChart message  Awaiting patient response if any  No further nursing outreach at this time

## 2023-10-29 NOTE — Telephone Encounter (Signed)
 Patient returned call. Advised her of mychart message. She states she will review message and send a response if there are any further questions. Please advise.

## 2023-11-09 ENCOUNTER — Ambulatory Visit: Payer: Self-pay | Admitting: Cardiology

## 2023-11-21 DIAGNOSIS — D500 Iron deficiency anemia secondary to blood loss (chronic): Secondary | ICD-10-CM | POA: Diagnosis not present

## 2023-11-21 DIAGNOSIS — Z6828 Body mass index (BMI) 28.0-28.9, adult: Secondary | ICD-10-CM | POA: Diagnosis not present

## 2023-11-21 DIAGNOSIS — Z79899 Other long term (current) drug therapy: Secondary | ICD-10-CM | POA: Diagnosis not present

## 2023-11-21 DIAGNOSIS — Z3200 Encounter for pregnancy test, result unknown: Secondary | ICD-10-CM | POA: Diagnosis not present

## 2023-11-21 DIAGNOSIS — M25551 Pain in right hip: Secondary | ICD-10-CM | POA: Diagnosis not present

## 2023-11-25 DIAGNOSIS — Z79899 Other long term (current) drug therapy: Secondary | ICD-10-CM | POA: Diagnosis not present

## 2023-12-02 ENCOUNTER — Encounter: Payer: Self-pay | Admitting: *Deleted

## 2023-12-22 DIAGNOSIS — R030 Elevated blood-pressure reading, without diagnosis of hypertension: Secondary | ICD-10-CM | POA: Diagnosis not present

## 2023-12-22 DIAGNOSIS — Z6828 Body mass index (BMI) 28.0-28.9, adult: Secondary | ICD-10-CM | POA: Diagnosis not present

## 2023-12-22 DIAGNOSIS — D500 Iron deficiency anemia secondary to blood loss (chronic): Secondary | ICD-10-CM | POA: Diagnosis not present

## 2023-12-22 DIAGNOSIS — M25551 Pain in right hip: Secondary | ICD-10-CM | POA: Diagnosis not present

## 2023-12-22 DIAGNOSIS — I10 Essential (primary) hypertension: Secondary | ICD-10-CM | POA: Diagnosis not present

## 2023-12-22 DIAGNOSIS — Z79899 Other long term (current) drug therapy: Secondary | ICD-10-CM | POA: Diagnosis not present

## 2023-12-22 DIAGNOSIS — Z3200 Encounter for pregnancy test, result unknown: Secondary | ICD-10-CM | POA: Diagnosis not present

## 2023-12-25 DIAGNOSIS — Z79899 Other long term (current) drug therapy: Secondary | ICD-10-CM | POA: Diagnosis not present

## 2023-12-29 ENCOUNTER — Ambulatory Visit: Attending: Cardiology | Admitting: Cardiology

## 2024-01-07 DIAGNOSIS — F411 Generalized anxiety disorder: Secondary | ICD-10-CM | POA: Diagnosis not present

## 2024-01-07 DIAGNOSIS — F331 Major depressive disorder, recurrent, moderate: Secondary | ICD-10-CM | POA: Diagnosis not present

## 2024-01-17 DIAGNOSIS — Z3200 Encounter for pregnancy test, result unknown: Secondary | ICD-10-CM | POA: Diagnosis not present

## 2024-01-17 DIAGNOSIS — D500 Iron deficiency anemia secondary to blood loss (chronic): Secondary | ICD-10-CM | POA: Diagnosis not present

## 2024-01-17 DIAGNOSIS — Z6828 Body mass index (BMI) 28.0-28.9, adult: Secondary | ICD-10-CM | POA: Diagnosis not present

## 2024-01-17 DIAGNOSIS — M25551 Pain in right hip: Secondary | ICD-10-CM | POA: Diagnosis not present

## 2024-01-17 DIAGNOSIS — R030 Elevated blood-pressure reading, without diagnosis of hypertension: Secondary | ICD-10-CM | POA: Diagnosis not present

## 2024-01-17 DIAGNOSIS — Z79899 Other long term (current) drug therapy: Secondary | ICD-10-CM | POA: Diagnosis not present

## 2024-01-17 DIAGNOSIS — I10 Essential (primary) hypertension: Secondary | ICD-10-CM | POA: Diagnosis not present

## 2024-01-22 DIAGNOSIS — Z79899 Other long term (current) drug therapy: Secondary | ICD-10-CM | POA: Diagnosis not present

## 2024-02-05 DIAGNOSIS — F411 Generalized anxiety disorder: Secondary | ICD-10-CM | POA: Diagnosis not present

## 2024-02-05 DIAGNOSIS — F331 Major depressive disorder, recurrent, moderate: Secondary | ICD-10-CM | POA: Diagnosis not present

## 2024-02-17 DIAGNOSIS — M25551 Pain in right hip: Secondary | ICD-10-CM | POA: Diagnosis not present

## 2024-02-17 DIAGNOSIS — M87059 Idiopathic aseptic necrosis of unspecified femur: Secondary | ICD-10-CM | POA: Diagnosis not present

## 2024-02-17 DIAGNOSIS — D500 Iron deficiency anemia secondary to blood loss (chronic): Secondary | ICD-10-CM | POA: Diagnosis not present

## 2024-02-17 DIAGNOSIS — R030 Elevated blood-pressure reading, without diagnosis of hypertension: Secondary | ICD-10-CM | POA: Diagnosis not present

## 2024-02-17 DIAGNOSIS — Z79899 Other long term (current) drug therapy: Secondary | ICD-10-CM | POA: Diagnosis not present

## 2024-02-17 DIAGNOSIS — Z3200 Encounter for pregnancy test, result unknown: Secondary | ICD-10-CM | POA: Diagnosis not present

## 2024-02-19 DIAGNOSIS — Z79899 Other long term (current) drug therapy: Secondary | ICD-10-CM | POA: Diagnosis not present

## 2024-03-16 ENCOUNTER — Telehealth: Payer: Self-pay | Admitting: *Deleted

## 2024-03-16 ENCOUNTER — Ambulatory Visit: Admitting: Family Medicine

## 2024-03-16 NOTE — Telephone Encounter (Signed)
 Pt was referral to [facility] associates for Rheumatology back in 2021 but never went.  She reports that she called them today and they told her if she gets a referral from us  and give them a call back they can see her.  She is requesting a referral be placed for her Lupus.  Advised that she may need an appt first which she is ok with, and states just let me know and I will make one. Harlene Carte, CMA

## 2024-03-16 NOTE — Progress Notes (Deleted)
    SUBJECTIVE:   CHIEF COMPLAINT / HPI: discuss Depo  ***  PERTINENT  PMH / PSH: HTN, AVN Femur Head, SLE  OBJECTIVE:   There were no vitals taken for this visit.  ***  ASSESSMENT/PLAN:   Assessment & Plan  No follow-ups on file.  [name], MD [facility]

## 2024-03-18 ENCOUNTER — Ambulatory Visit: Admitting: Student

## 2024-03-18 ENCOUNTER — Encounter: Payer: Self-pay | Admitting: Student

## 2024-03-18 VITALS — BP 137/88 | HR 75 | Ht 65.0 in | Wt 167.4 lb

## 2024-03-18 DIAGNOSIS — Z30019 Encounter for initial prescription of contraceptives, unspecified: Secondary | ICD-10-CM | POA: Insufficient documentation

## 2024-03-18 LAB — POCT URINE PREGNANCY: Preg Test, Ur: NEGATIVE

## 2024-03-18 MED ORDER — MEDROXYPROGESTERONE ACETATE 150 MG/ML IM SUSP
150.0000 mg | Freq: Once | INTRAMUSCULAR | Status: AC
  Administered 2024-03-18: 150 mg via INTRAMUSCULAR

## 2024-03-18 MED ORDER — METHYLPREDNISOLONE ACETATE 40 MG/ML IJ SUSP: 40.0000 mg | Freq: Once | INTRAMUSCULAR | Status: DC

## 2024-03-18 NOTE — Patient Instructions (Signed)
 Pleasure to see you today.  Your pregnancy test was negative.  Today we gave you your Depo shot.  Your next due date is June 03, 2024-June 17, 2024.

## 2024-03-18 NOTE — Assessment & Plan Note (Signed)
 Pregnancy test today was negative.  No contraindication.  Depo shot administered with good tolerance.  Patient to return for next administration between December 11- June 17, 2024.

## 2024-03-18 NOTE — Progress Notes (Signed)
    SUBJECTIVE:   CHIEF COMPLAINT / HPI:   43 year old female presenting today for contraceptive management.  Patient expressed desire to restart her Depo-Provera  shots.  She has used this in the past but have been without birth control for the past few years.  Denies any history of migraine headaches, blood pressure because of breast cancer. Not sexually active and LMP was was earlier this month.  PERTINENT  PMH / PSH: Reviewed   OBJECTIVE:   BP 137/88   Pulse 75   Ht 5' 5 (1.651 m)   Wt 167 lb 6.4 oz (75.9 kg)   SpO2 99%   BMI 27.86 kg/m    Physical Exam General: Alert, well appearing, NAD Cardiovascular: RRR, No Murmurs, Normal S2/S2 Respiratory: CTAB, No wheezing or Rales Abdomen: No distension or tenderness Extremities: No edema on extremities     ASSESSMENT/PLAN:   Encounter for female birth control Pregnancy test today was negative.  No contraindication.  Depo shot administered with good tolerance.  Patient to return for next administration between December 11- June 17, 2024.     Norleen April, MD [facility]

## 2024-03-19 NOTE — Telephone Encounter (Signed)
 Hey Dr. Rosendo,  Did you discuss this referral with the patient. Harlene Carte, CMA

## 2024-03-22 DIAGNOSIS — Z3200 Encounter for pregnancy test, result unknown: Secondary | ICD-10-CM | POA: Diagnosis not present

## 2024-03-22 DIAGNOSIS — M25551 Pain in right hip: Secondary | ICD-10-CM | POA: Diagnosis not present

## 2024-03-22 DIAGNOSIS — Z79899 Other long term (current) drug therapy: Secondary | ICD-10-CM | POA: Diagnosis not present

## 2024-03-22 DIAGNOSIS — D500 Iron deficiency anemia secondary to blood loss (chronic): Secondary | ICD-10-CM | POA: Diagnosis not present

## 2024-03-22 DIAGNOSIS — M87051 Idiopathic aseptic necrosis of right femur: Secondary | ICD-10-CM | POA: Diagnosis not present

## 2024-03-22 DIAGNOSIS — R030 Elevated blood-pressure reading, without diagnosis of hypertension: Secondary | ICD-10-CM | POA: Diagnosis not present

## 2024-03-22 DIAGNOSIS — Z6827 Body mass index (BMI) 27.0-27.9, adult: Secondary | ICD-10-CM | POA: Diagnosis not present

## 2024-03-24 ENCOUNTER — Ambulatory Visit: Admitting: Student

## 2024-03-24 ENCOUNTER — Other Ambulatory Visit: Payer: Self-pay | Admitting: Student

## 2024-03-24 DIAGNOSIS — M329 Systemic lupus erythematosus, unspecified: Secondary | ICD-10-CM

## 2024-03-24 NOTE — Progress Notes (Signed)
 Called and verified patient.  Discussed her history of SLE, patient is not currently having any flareups and reports she had self weaned off of her medication for at least 5 years now.  Would like to reestablish with rheumatology for routine surveillance.  Placed ambulatory referral for rheumatology.

## 2024-04-09 DIAGNOSIS — F331 Major depressive disorder, recurrent, moderate: Secondary | ICD-10-CM | POA: Diagnosis not present

## 2024-04-09 DIAGNOSIS — F411 Generalized anxiety disorder: Secondary | ICD-10-CM | POA: Diagnosis not present

## 2024-04-15 DIAGNOSIS — R030 Elevated blood-pressure reading, without diagnosis of hypertension: Secondary | ICD-10-CM | POA: Diagnosis not present

## 2024-04-15 DIAGNOSIS — M25551 Pain in right hip: Secondary | ICD-10-CM | POA: Diagnosis not present

## 2024-04-15 DIAGNOSIS — Z79899 Other long term (current) drug therapy: Secondary | ICD-10-CM | POA: Diagnosis not present

## 2024-04-15 DIAGNOSIS — M87051 Idiopathic aseptic necrosis of right femur: Secondary | ICD-10-CM | POA: Diagnosis not present

## 2024-04-15 DIAGNOSIS — D500 Iron deficiency anemia secondary to blood loss (chronic): Secondary | ICD-10-CM | POA: Diagnosis not present

## 2024-04-15 DIAGNOSIS — Z6827 Body mass index (BMI) 27.0-27.9, adult: Secondary | ICD-10-CM | POA: Diagnosis not present

## 2024-04-15 DIAGNOSIS — Z3200 Encounter for pregnancy test, result unknown: Secondary | ICD-10-CM | POA: Diagnosis not present

## 2024-04-19 DIAGNOSIS — Z79899 Other long term (current) drug therapy: Secondary | ICD-10-CM | POA: Diagnosis not present

## 2024-05-12 DIAGNOSIS — D500 Iron deficiency anemia secondary to blood loss (chronic): Secondary | ICD-10-CM | POA: Diagnosis not present

## 2024-05-12 DIAGNOSIS — M25551 Pain in right hip: Secondary | ICD-10-CM | POA: Diagnosis not present

## 2024-05-12 DIAGNOSIS — M87051 Idiopathic aseptic necrosis of right femur: Secondary | ICD-10-CM | POA: Diagnosis not present

## 2024-05-12 DIAGNOSIS — Z3200 Encounter for pregnancy test, result unknown: Secondary | ICD-10-CM | POA: Diagnosis not present

## 2024-05-12 DIAGNOSIS — I10 Essential (primary) hypertension: Secondary | ICD-10-CM | POA: Diagnosis not present

## 2024-05-12 DIAGNOSIS — Z6827 Body mass index (BMI) 27.0-27.9, adult: Secondary | ICD-10-CM | POA: Diagnosis not present

## 2024-05-12 DIAGNOSIS — R030 Elevated blood-pressure reading, without diagnosis of hypertension: Secondary | ICD-10-CM | POA: Diagnosis not present

## 2024-05-12 DIAGNOSIS — Z79899 Other long term (current) drug therapy: Secondary | ICD-10-CM | POA: Diagnosis not present

## 2024-05-14 ENCOUNTER — Telehealth: Admitting: Family Medicine

## 2024-05-14 DIAGNOSIS — R21 Rash and other nonspecific skin eruption: Secondary | ICD-10-CM

## 2024-05-14 DIAGNOSIS — Z79899 Other long term (current) drug therapy: Secondary | ICD-10-CM | POA: Diagnosis not present

## 2024-05-14 MED ORDER — PREDNISONE 10 MG PO TABS: 10.0000 mg | ORAL_TABLET | ORAL | 0 refills | Status: AC

## 2024-05-14 NOTE — Progress Notes (Signed)
 E Visit for Rash  We are sorry that you are not feeling well. Here is how we plan to help!    I recommend you take Benadryl  25 mg - 50 mg every 4 hours to control the symptoms but if they last over 24 hours it is best that you see an office based provider for follow up.    Prednisone  10 mg daily for 6 days (see taper instructions below)   HOME CARE:  Take cool showers and avoid direct sunlight. Apply cool compress or wet dressings. Take a bath in an oatmeal bath.  Sprinkle content of one Aveeno packet under running faucet with comfortably warm water.  Bathe for 15-20 minutes, 1-2 times daily.  Pat dry with a towel. Do not rub the rash. Use hydrocortisone cream. Take an antihistamine like Benadryl  for widespread rashes that itch.  The adult dose of Benadryl  is 25-50 mg by mouth 4 times daily. Caution:  This type of medication may cause sleepiness.  Do not drink alcohol, drive, or operate dangerous machinery while taking antihistamines.  Do not take these medications if you have prostate enlargement.  Read package instructions thoroughly on all medications that you take.  GET HELP RIGHT AWAY IF:  Symptoms don't go away after treatment. Severe itching that persists. If you rash spreads or swells. If you rash begins to smell. If it blisters and opens or develops a yellow-brown crust. You develop a fever. You have a sore throat. You become short of breath.  MAKE SURE YOU:  Understand these instructions. Will watch your condition. Will get help right away if you are not doing well or get worse.  Thank you for choosing an e-visit. Your e-visit answers were reviewed by a board certified advanced clinical practitioner to complete your personal care plan. Depending upon the condition, your plan could have included both over the counter or prescription medications. Please review your pharmacy choice. Be sure that the pharmacy you have chosen is open so that you can pick up your  prescription now.  If there is a problem you may message your provider in MyChart to have the prescription routed to another pharmacy. Your safety is important to us . If you have drug allergies check your prescription carefully.  For the next 24 hours, you can use MyChart to ask questions about today's visit, request a non-urgent call back, or ask for a work or school excuse from your e-visit provider. You will get an email in the next two days asking about your experience. I hope that your e-visit has been valuable and will speed your recovery.  I have spent 5 minutes in review of e-visit questionnaire, review and updating patient chart, medical decision making and response to patient.   Jarmaine Ehrler, FNP

## 2024-05-19 ENCOUNTER — Encounter

## 2024-05-19 ENCOUNTER — Ambulatory Visit: Admitting: Cardiology

## 2024-05-21 DIAGNOSIS — F4323 Adjustment disorder with mixed anxiety and depressed mood: Secondary | ICD-10-CM | POA: Diagnosis not present

## 2024-05-25 DIAGNOSIS — F331 Major depressive disorder, recurrent, moderate: Secondary | ICD-10-CM | POA: Diagnosis not present

## 2024-05-25 DIAGNOSIS — F411 Generalized anxiety disorder: Secondary | ICD-10-CM | POA: Diagnosis not present

## 2024-06-15 ENCOUNTER — Telehealth: Admitting: Physician Assistant

## 2024-06-15 DIAGNOSIS — T7840XA Allergy, unspecified, initial encounter: Secondary | ICD-10-CM | POA: Diagnosis not present

## 2024-06-16 MED ORDER — PREDNISONE 10 MG (21) PO TBPK: ORAL_TABLET | ORAL | 0 refills | Status: DC

## 2024-06-16 NOTE — Progress Notes (Signed)
 E Visit for Rash  We are sorry that you are not feeling well. Here is how we plan to help!  Based on what you shared with me you may an allergic reaction.   I have added on a steroid pack to take as directed. Follow recommendations below. Make sure to follow-up in-person with your PCP for further workup giving this is now a recurrent issue.   HOME CARE:  Take cool showers and avoid direct sunlight. Apply cool compress or wet dressings. Take a bath in an oatmeal bath.  Sprinkle content of one Aveeno packet under running faucet with comfortably warm water.  Bathe for 15-20 minutes, 1-2 times daily.  Pat dry with a towel. Do not rub the rash. Use hydrocortisone cream. Take an antihistamine like Benadryl  for widespread rashes that itch.  The adult dose of Benadryl  is 25-50 mg by mouth 4 times daily. Caution:  This type of medication may cause sleepiness.  Do not drink alcohol, drive, or operate dangerous machinery while taking antihistamines.  Do not take these medications if you have prostate enlargement.  Read package instructions thoroughly on all medications that you take.  GET HELP RIGHT AWAY IF:  Symptoms don't go away after treatment. Severe itching that persists. If you rash spreads or swells. If you rash begins to smell. If it blisters and opens or develops a yellow-brown crust. You develop a fever. You have a sore throat. You become short of breath.  MAKE SURE YOU:  Understand these instructions. Will watch your condition. Will get help right away if you are not doing well or get worse.  Thank you for choosing an e-visit. Your e-visit answers were reviewed by a board certified advanced clinical practitioner to complete your personal care plan. Depending upon the condition, your plan could have included both over the counter or prescription medications. Please review your pharmacy choice. Be sure that the pharmacy you have chosen is open so that you can pick up your  prescription now.  If there is a problem you may message your provider in MyChart to have the prescription routed to another pharmacy. Your safety is important to us . If you have drug allergies check your prescription carefully.  For the next 24 hours, you can use MyChart to ask questions about todays visit, request a non-urgent call back, or ask for a work or school excuse from your e-visit provider. You will get an email in the next two days asking about your experience. I hope that your e-visit has been valuable and will speed your recovery.  I have spent 5 minutes in review of e-visit questionnaire, review and updating patient chart, medical decision making and response to patient.   Elsie Velma Lunger, PA-C

## 2024-06-21 DIAGNOSIS — M25551 Pain in right hip: Secondary | ICD-10-CM | POA: Diagnosis not present

## 2024-06-21 DIAGNOSIS — Z3200 Encounter for pregnancy test, result unknown: Secondary | ICD-10-CM | POA: Diagnosis not present

## 2024-06-21 DIAGNOSIS — R030 Elevated blood-pressure reading, without diagnosis of hypertension: Secondary | ICD-10-CM | POA: Diagnosis not present

## 2024-06-21 DIAGNOSIS — I10 Essential (primary) hypertension: Secondary | ICD-10-CM | POA: Diagnosis not present

## 2024-06-21 DIAGNOSIS — D500 Iron deficiency anemia secondary to blood loss (chronic): Secondary | ICD-10-CM | POA: Diagnosis not present

## 2024-06-21 DIAGNOSIS — Z79899 Other long term (current) drug therapy: Secondary | ICD-10-CM | POA: Diagnosis not present

## 2024-06-21 DIAGNOSIS — Z6827 Body mass index (BMI) 27.0-27.9, adult: Secondary | ICD-10-CM | POA: Diagnosis not present

## 2024-06-29 ENCOUNTER — Ambulatory Visit: Admitting: Cardiology

## 2024-07-12 ENCOUNTER — Ambulatory Visit: Payer: Self-pay | Admitting: Family Medicine

## 2024-07-12 ENCOUNTER — Encounter: Payer: Self-pay | Admitting: Family Medicine

## 2024-07-12 VITALS — BP 130/86 | HR 82 | Ht 65.0 in | Wt 164.6 lb

## 2024-07-12 DIAGNOSIS — N6002 Solitary cyst of left breast: Secondary | ICD-10-CM | POA: Diagnosis not present

## 2024-07-12 DIAGNOSIS — Z30019 Encounter for initial prescription of contraceptives, unspecified: Secondary | ICD-10-CM

## 2024-07-12 DIAGNOSIS — M329 Systemic lupus erythematosus, unspecified: Secondary | ICD-10-CM

## 2024-07-12 DIAGNOSIS — Z1231 Encounter for screening mammogram for malignant neoplasm of breast: Secondary | ICD-10-CM

## 2024-07-12 LAB — POCT URINE PREGNANCY: Preg Test, Ur: NEGATIVE

## 2024-07-12 MED ORDER — MEDROXYPROGESTERONE ACETATE 150 MG/ML IM SUSP
150.0000 mg | Freq: Once | INTRAMUSCULAR | Status: AC
  Administered 2024-07-12: 150 mg via INTRAMUSCULAR

## 2024-07-12 NOTE — Assessment & Plan Note (Signed)
 Can be recently certain patient is not pregnant based on CDC criteria.  Will get urine pregnancy test regardless. Upreg negative. Depo shot administered today.  Next shot due before 09/09/2024.

## 2024-07-12 NOTE — Addendum Note (Signed)
 Addended by: ONEITA ROUSE A on: 07/12/2024 09:18 AM   Modules accepted: Orders

## 2024-07-12 NOTE — Patient Instructions (Addendum)
 It was great to see you! Thank you for allowing me to participate in your care!  Our plans for today:   VISIT SUMMARY: Today, you were seen for a referral to a new rheumatologist due to dissatisfaction with your previous one. We also addressed your, contraceptive management, and scheduled a screening mammogram.  YOUR PLAN: CONTRACEPTIVE MANAGEMENT: You are using Depo-Provera  for contraception and experienced some spotting recently, which is a common side effect. -You received a depot medroxyprogesterone  injection today.  SYSTEMIC LUPUS ERYTHEMATOSUS: You have lupus and are seeking a new rheumatologist. -A referral to a new rheumatologist has been placed.  SCREENING MAMMOGRAM: You have a history of a breast biopsy and need early mammograms. -A screening mammogram has been ordered.    Please arrive 15 minutes PRIOR to your next scheduled appointment time! If you do not, this affects OTHER patients' care.  Take care and seek immediate care sooner if you develop any concerns.   [name], MD, PGY-3 [facility] 9:09 AM 07/12/2024  [facility]

## 2024-07-12 NOTE — Assessment & Plan Note (Signed)
 No active flare.  Patient not manage medically.  Agree that she needs monitoring with rheumatologist.  Repeat referral placed.

## 2024-07-12 NOTE — Assessment & Plan Note (Signed)
 Prior history of left breast fibroadenoma.  Repeat ultrasounds lost to follow-up.  Will repeat diagnostic mammogram today.

## 2024-07-12 NOTE — Progress Notes (Signed)
" ° ° °  SUBJECTIVE:   CHIEF COMPLAINT / HPI: birth control  Discussed the use of AI scribe software for clinical note transcription with the patient, who gave verbal consent to proceed.  History of Present Illness Angela Walls is a 44 year old female with lupus who presents for a referral to a new rheumatologist and contraceptive use  Systemic lupus erythematosus - Requests referral to a different rheumatologist due to dissatisfaction with prior rheumatologist. - Not taking any medications for lupus; discontinued years ago. 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 - Pain is unchanged. - No new joint pain or fever.  Contraceptive use and menstrual history - Uses Depo-Provera  for contraception. - Amenorrheic while on Depo-Provera , with brief spotting last week. - Previously used Depo-Provera , stopped, and then restarted. - No sexual activity for over a year. - Last seen by Dr. Rosendo on 03/18/2024 for Depo injection    PERTINENT  PMH / PSH: Hypertension, SLE  OBJECTIVE:   BP 130/86   Pulse 82   Ht 5' 5 (1.651 m)   Wt 164 lb 9.6 oz (74.7 kg)   SpO2 100%   BMI 27.39 kg/m   Physical Exam General: NAD, well appearing Neuro: A&O Respiratory: normal WOB on RA Extremities: Moving all 4 extremities equally   ASSESSMENT/PLAN:   Assessment & Plan Encounter for female birth control Can be recently certain patient is not pregnant based on CDC criteria.  Will get urine pregnancy test regardless. Upreg negative. Depo shot administered today.  Next shot due before 09/09/2024. Systemic lupus erythematosus, unspecified SLE type, unspecified organ involvement status (HCC) No active flare.  Patient not manage medically.  Agree that she needs monitoring with rheumatologist.  Repeat referral placed. Encounter for screening mammogram for malignant neoplasm of breast Cyst of left breast, fibroadenoma Prior history of left breast fibroadenoma.  Repeat ultrasounds  lost to follow-up.  Will repeat diagnostic mammogram today.  Return in about 4 weeks (around 08/09/2024).  Angela Provencal, MD, PGY-3 [facility] 8:59 AM 07/12/2024  [facility]   "

## 2024-07-13 ENCOUNTER — Telehealth: Payer: Self-pay

## 2024-07-13 DIAGNOSIS — Z1231 Encounter for screening mammogram for malignant neoplasm of breast: Secondary | ICD-10-CM

## 2024-07-13 NOTE — Telephone Encounter (Signed)
 Angela Walls with [facility] calls nurse line in regards to mammography order.   She reports unless the patient is currently having symptoms, the diagnostic mammogram order will need to be changed.   She reports a history of fibroadenoma only has a window of 5 years.   She reports if the patient is asymptomatic a screening mammogram will need to be ordered as the process restarts.   Advised will forward to PCP.

## 2024-07-14 ENCOUNTER — Encounter: Payer: Self-pay | Admitting: Cardiology

## 2024-07-14 ENCOUNTER — Ambulatory Visit: Attending: Cardiology | Admitting: Cardiology

## 2024-07-14 VITALS — BP 110/68 | HR 78 | Resp 16 | Ht 65.0 in | Wt 163.8 lb

## 2024-07-14 DIAGNOSIS — I25112 Atherosclerotic heart disease of native coronary artery with refractory angina pectoris: Secondary | ICD-10-CM | POA: Diagnosis not present

## 2024-07-14 DIAGNOSIS — E782 Mixed hyperlipidemia: Secondary | ICD-10-CM | POA: Insufficient documentation

## 2024-07-14 DIAGNOSIS — I10 Essential (primary) hypertension: Secondary | ICD-10-CM | POA: Diagnosis not present

## 2024-07-14 NOTE — Progress Notes (Signed)
 "   Cardiology Office Note:    Date:  07/14/2024  NAME:  Angela Walls    MRN: [id number] DOB:  Jul 09, 1980   PCP:  Alba Sharper, MD  Former Cardiology Providers: None Primary Cardiologist:  Madonna Large, DO, [facility] (established care 08/28/2023) Electrophysiologist:  None   Chief Complaint  Patient presents with   Atherosclerosis of native coronary artery of native heart w   Follow-up    History of Present Illness:    Angela Walls is a 44 y.o. African-American female whose past medical history and cardiovascular risk factors includes: CAD, HTN, Lupus (cutaneous).   Patient was referred to the practice for evaluation of precordial pain.  She has been experiencing exertional chest pain since January 2025 which is felt to be predominantly cardiac.  Patient underwent coronary CTA and based on the results was called in sooner to discuss results in person.  Patient is noted to have disease in OM 2 with CT FFR suggestive of hemodynamically significant disease.  In May 2025, based on her symptoms and coronary CTA findings recommended angiography for further evaluation.  However patient recommended to proceed forward with medical management and if refractory to medical therapy she would agree to undergo left heart catheterization with possible intervention.  She was recommended to follow-up in 8 weeks, but failed to do so.  In addition, patient still had not started her metoprolol  or statin therapy.  And her blood pressure is needed further uptitration of medical therapy as well.  She was advised to follow-up with PCP as well.  She presents today for follow-up. Since her last visit in May 2025 if she continues to have precordial chest pain suggestive of angina. However with uptitration of medical therapy overall intensity frequency and duration has improved.  With over exertional activities she continues to have substernal chest pain, intensity 4 out of 10, lasting for 15 minutes, and resolves with  rest. Patient has not had any repeat lipids after initiation of statin therapy. Blood pressures have significantly improved and she is happy with the progress  Current Medications: Current Meds  Medication Sig   amLODipine  (NORVASC ) 10 MG tablet Take 1 tablet (10 mg total) by mouth daily. NEEDS PCP APPT BEFORE LONGER FILL   aspirin  EC 81 MG tablet Take 1 tablet (81 mg total) by mouth daily. Swallow whole.   diclofenac  Sodium (VOLTAREN ) 1 % GEL Apply 2 g topically 4 (four) times daily.   EPINEPHrine  0.3 mg/0.3 mL IJ SOAJ injection Inject 0.3 mLs (0.3 mg total) into the muscle as needed for anaphylaxis.   HYDROcodone -acetaminophen  (NORCO) 10-325 MG tablet Take 1 tablet by mouth every 6 (six) hours as needed for severe pain (pain score 7-10).   metoprolol  tartrate (LOPRESSOR ) 25 MG tablet Take 1 tablet (25 mg total) by mouth 2 (two) times daily.   naproxen  sodium (ALEVE ) 220 MG tablet Take 220 mg by mouth.   nitroGLYCERIN  (NITROSTAT ) 0.4 MG SL tablet Place 1 tablet (0.4 mg total) under the tongue every 5 (five) minutes as needed for chest pain.   predniSONE  (STERAPRED UNI-PAK 21 TAB) 10 MG (21) TBPK tablet Take following package directions   rosuvastatin  (CRESTOR ) 20 MG tablet Take 1 tablet (20 mg total) by mouth at bedtime.   VITAMIN D, CHOLECALCIFEROL, PO Take by mouth.     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 On chronic narcotics for this.   Coronary atherosclerosis of native coronary artery    Hypertension    Lupus    Followed by Rheum. On chronic Prednisone .   SOB (shortness of breath)     Past Surgical History: Past Surgical History:  Procedure Laterality Date   APPENDECTOMY  2001   BREAST CYST EXCISION Left 08/31/2018   Procedure: EXCISION LEFT BREAST CYST;  Surgeon: Vernetta Berg, MD;  Location: [facility] OR;  Service: General;  Laterality: Left;    CESAREAN SECTION  2010   LIPOMA EXCISION Right 08/31/2018   Procedure: Excision right arm cyst;  Surgeon: Vernetta Berg, MD;  Location: [facility] OR;  Service: General;  Laterality: Right;    Social History: Social History   Tobacco Use   Smoking status: Never    Passive exposure: Never   Smokeless tobacco: Never  Vaping Use   Vaping status: Never Used  Substance Use Topics   Alcohol use: No   Drug use: No    Family History: Family History  Problem Relation Age of Onset   Hypertension Father    Hypertension Maternal Grandmother    Hypertension Paternal Grandmother    Stroke Neg Hx    Heart disease Neg Hx    Cancer Neg Hx    Breast cancer Neg Hx     ROS:   Review of Systems  Cardiovascular:  Positive for chest pain (see HPI). Negative for claudication, irregular heartbeat, leg swelling, near-syncope, orthopnea, palpitations, paroxysmal nocturnal dyspnea and syncope.  Respiratory:  Negative for shortness of breath.   Hematologic/Lymphatic: Negative for bleeding problem.    EKGs/Labs/Other Studies Reviewed:   Echocardiogram: 10/15/2023  1. Left ventricular ejection fraction, by estimation, is 55 to 60%. Left  ventricular ejection fraction by 3D volume is 56 %. The left ventricle has  normal function. The left ventricle has no regional wall motion  abnormalities. Left ventricular diastolic   parameters were normal. The average left ventricular global longitudinal  strain is -24.7 %. The global longitudinal strain is normal.   2. Right ventricular systolic function is normal. The right ventricular  size is normal. There is normal pulmonary artery systolic pressure. The  estimated right ventricular systolic pressure is 18.1 mmHg.   3. Left atrial size was mildly dilated.   4. The mitral valve is normal in structure. Moderate mitral valve  regurgitation. No evidence of mitral stenosis.   5. The aortic valve is tricuspid. Aortic valve regurgitation is not  visualized. No  aortic stenosis is present.   6. The inferior vena cava is normal in size with greater than 50%  respiratory variability, suggesting right atrial pressure of 3 mmHg.   CCTA: 10/23/2023 1. Coronary calcium  score of 0. This was 1st percentile for age and sex matched control.   2. Total plaque volume 129 mm3 which is 93rd percentile for age- and sex-matched controls. TPV is moderate.   3. Normal coronary origins with right dominance.   4. CAD-RADS 3 Moderate CAD.   5. CT FFR will be performed and reported separately - further evaluate the LCx/OM.  6.  Radiology overread: No significant extracardiac findings within the visualized chest.   RECOMMENDATION: Consider symptom-guided anti-ischemic pharmacotherapy as well as risk factor modification per guideline directed care. Additional analysis with CT FFR will be submitted.  CT-FFR 10/23/2023: 1. Left Main: FFR = 0.98 2. LAD: Proximal FFR = 0.94, Mid FFR = 0.90, Distal FFR = 0.84 3. LCX: Proximal FFR = 0.97, Distal FFR = 0.91  4. OM1: Not modeled due to small caliber vessel 5. OM2: Proximal FFR = 0.90, Distal FFR = 0.61 6. RCA: Proximal FFR = 1.0, Mid FFR =0.93, Distal FFR = 0.92   IMPRESSION: 1. CT FFR analysis shows significant stenosis within the proximal segment of the second obtuse marginal branch (OM2).   RECOMMENDATIONS: Guideline-directed medical therapy and aggressive risk factor modification for secondary prevention of coronary artery disease.   If symptoms refractory to medical therapy strongly consider invasive angiography for further evaluation. Clinical correlation required.  Labs:    Latest Ref Rng & Units 08/25/2018   10:05 AM 12/16/2017   11:12 AM 04/28/2015   10:13 AM  CBC  WBC 4.0 - 10.5 K/uL 5.2  5.0  6.8   Hemoglobin 12.0 - 15.0 g/dL 89.3  88.6  88.5   Hematocrit 36.0 - 46.0 % 33.6  33.8  34.4   Platelets 150 - 400 K/uL 334  368  347        Latest Ref Rng & Units 10/27/2023    8:13 AM 07/08/2022    2:05 PM  12/07/2018    2:46 PM  BMP  Glucose 70 - 99 mg/dL 91  86  88   BUN 6 - 24 mg/dL 13  12  13    Creatinine 0.57 - 1.00 mg/dL 9.21  9.09  9.08   BUN/Creat Ratio 9 - 23 17  13  14    Sodium 134 - 144 mmol/L 140  140  138   Potassium 3.5 - 5.2 mmol/L 4.0  3.8  4.1   Chloride 96 - 106 mmol/L 108  106  105   CO2 20 - 29 mmol/L 22  21  20    Calcium  8.7 - 10.2 mg/dL 8.7  9.2  9.2       Latest Ref Rng & Units 10/27/2023    8:13 AM 07/08/2022    2:05 PM 12/07/2018    2:46 PM  CMP  Glucose 70 - 99 mg/dL 91  86  88   BUN 6 - 24 mg/dL 13  12  13    Creatinine 0.57 - 1.00 mg/dL 9.21  9.09  9.08   Sodium 134 - 144 mmol/L 140  140  138   Potassium 3.5 - 5.2 mmol/L 4.0  3.8  4.1   Chloride 96 - 106 mmol/L 108  106  105   CO2 20 - 29 mmol/L 22  21  20    Calcium  8.7 - 10.2 mg/dL 8.7  9.2  9.2   Total Protein 6.0 - 8.5 g/dL 5.7     Total Bilirubin 0.0 - 1.2 mg/dL 0.2     Alkaline Phos 44 - 121 IU/L 51     AST 0 - 40 IU/L 11     ALT 0 - 32 IU/L 7       Lab Results  Component Value Date   CHOL 147 10/27/2023   HDL 50 10/27/2023   LDLCALC 86 10/27/2023   LDLDIRECT 92 10/27/2023   TRIG 49 10/27/2023   CHOLHDL 2.9 10/27/2023   No results for input(s): LIPOA in the last 8760 hours. No components found for: NTPROBNP No results for input(s): PROBNP in the last 8760 hours. No results for input(s): TSH in the last 8760 hours.  Physical Exam:    Today's Vitals   07/14/24 0914  BP: 110/68  Pulse: 78  Resp: 16  SpO2: 98%  Weight: 163 lb 12.8 oz (74.3 kg)  Height: 5' 5 (1.651 m)  Body mass index is 27.26 kg/m. Wt Readings from Last 3 Encounters:  07/14/24 163 lb 12.8 oz (74.3 kg)  07/12/24 164 lb 9.6 oz (74.7 kg)  03/18/24 167 lb 6.4 oz (75.9 kg)    Physical Exam  Constitutional: No distress.  hemodynamically stable  Neck: No JVD present.  Cardiovascular: Normal rate, regular rhythm, S1 normal and S2 normal. Exam reveals no gallop, no S3 and no S4.  No murmur  heard. Pulmonary/Chest: Effort normal and breath sounds normal. No stridor. She has no wheezes. She has no rales.  Musculoskeletal:        General: No edema.     Cervical back: Neck supple.  Skin: Skin is warm.   Impression & Recommendation(s):  Impression:   ICD-10-CM   1. 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 Benign hypertension  I10     3. Mixed hyperlipidemia  E78.2        Recommendation(s):  Atherosclerosis of native coronary artery of native heart with angina pectoris Continues to have anginal chest pain despite uptitration of medical therapy. Intensity, frequency, and duration have improved.  But still noticeable with exertional activities. We discussed up titration of antianginal therapies +/- undergoing angiography to evaluate for obstructive disease. Given the fact that she continues to have symptoms despite uptitration of medical therapy patient would like to proceed forward with left heart catheterization with possible intervention. Antianginal therapy: Amlodipine , metoprolol  Lipid therapy: Crestor  20 mg p.o. nightly Antiplatelets: Aspirin  81 mg p.o. daily  Benign hypertension Blood pressures have significantly improved. She is happy with the progress. Continue amlodipine  10 mg p.o. daily. Continue Lopressor  25 mg p.o. twice daily  Mixed hyperlipidemia Continue Crestor  20 mg p.o. nightly. Fasting lipids ordered Denies myalgias  Orders Placed:  Orders Placed This Encounter  Procedures   EKG 12-Lead   Final Medication List:    No orders of the defined types were placed in this encounter.   There are no discontinued medications.    Current Outpatient Medications:    amLODipine  (NORVASC ) 10 MG tablet, Take 1 tablet (10 mg total) by mouth daily. NEEDS PCP APPT BEFORE LONGER FILL, Disp: 30 tablet, Rfl: 0   aspirin  EC 81 MG tablet, Take 1 tablet (81 mg  total) by mouth daily. Swallow whole., Disp: , Rfl:    diclofenac  Sodium (VOLTAREN ) 1 % GEL, Apply 2 g topically 4 (four) times daily., Disp: 50 g, Rfl: 0   EPINEPHrine  0.3 mg/0.3 mL IJ SOAJ injection, Inject 0.3 mLs (0.3 mg total) into the muscle as needed for anaphylaxis., Disp: 1 each, Rfl: 0   HYDROcodone -acetaminophen  (NORCO) 10-325 MG tablet, Take 1 tablet by mouth every 6 (six) hours as needed for severe pain (pain score 7-10)., Disp: 110 tablet, Rfl: 0   metoprolol  tartrate (LOPRESSOR ) 25 MG tablet, Take 1 tablet (25 mg total) by mouth 2 (two) times daily., Disp: 60 tablet, Rfl: 3   naproxen  sodium (ALEVE ) 220 MG tablet, Take 220 mg by mouth., Disp: , Rfl:    nitroGLYCERIN  (NITROSTAT ) 0.4 MG SL tablet, Place 1 tablet (0.4 mg total) under the tongue every 5 (five) minutes as needed for chest pain., Disp: 30 tablet, Rfl: 0   predniSONE  (STERAPRED UNI-PAK 21 TAB) 10 MG (21) TBPK tablet, Take following package directions, Disp: 21 tablet, Rfl: 0   rosuvastatin  (CRESTOR ) 20 MG tablet, Take 1 tablet (20 mg total) by mouth at  bedtime., Disp: 30 tablet, Rfl: 3   VITAMIN D, CHOLECALCIFEROL, PO, Take by mouth., Disp: , Rfl:   Consent:   Informed Consent   Shared Decision Making/Informed Consent The risks [stroke (1 in 1000), death (1 in 1000), kidney failure [usually temporary] (1 in 500), bleeding (1 in 200), allergic reaction [possibly serious] (1 in 200)], benefits (diagnostic support and management of coronary artery disease) and alternatives of a cardiac catheterization were discussed in detail with Ms. Hase and she is willing to proceed.     Disposition:   2 weeks with APP status post left heart catheterization 6 months sooner if needed  Her questions and concerns were addressed to her satisfaction. She voices understanding of the recommendations provided during this encounter.    Signed, Madonna Michele HAS, [facility]  A Division of [facility] [address] [place], [place] [address]   07/14/2024 9:59 AM  "

## 2024-07-14 NOTE — Patient Instructions (Addendum)
 Medication Instructions:  Your physician recommends that you continue on your current medications as directed. Please refer to the Current Medication list given to you today.  *If you need a refill on your cardiac medications before your next appointment, please call your pharmacy*  Lab Work: CBC, CMP, Lipid Profile today If you have labs (blood work) drawn today and your tests are completely normal, you will receive your results only by: MyChart Message (if you have MyChart) OR A paper copy in the mail If you have any lab test that is abnormal or we need to change your treatment, we will call you to review the results.  Testing/Procedures: Left Heart Catheterization  Follow-Up: At [facility], you and your health needs are our priority.  As part of our continuing mission to provide you with exceptional heart care, our providers are all part of one team.  This team includes your primary Cardiologist (physician) and Advanced Practice Providers or APPs (Physician Assistants and Nurse Practitioners) who all work together to provide you with the care you need, when you need it.  Your next appointment:   2 week(s) post cath  Provider:   One of our Advanced Practice Providers (APPs): Morse Clause, PA-C  Lamarr Satterfield, NP Miriam Shams, NP  Olivia Pavy, PA-C Josefa Beauvais, NP  Leontine Salen, PA-C Orren Fabry, PA-C  [place], PA-C Ernest Dick, NP  Damien Braver, NP Jon Hails, PA-C  Waddell Donath, PA-C    Dayna Dunn, PA-C  Scott Weaver, PA-C Lum Louis, NP Katlyn West, NP Callie Goodrich, PA-C  Xika Zhao, NP Sheng Haley, PA-C    Kathleen Johnson, PA-C   Then, Sunit Tolia, DO will plan to see you again in 6 month(s).    We recommend signing up for the patient portal called MyChart.  Sign up information is provided on this After Visit Summary.  MyChart is used to connect with patients for Virtual Visits (Telemedicine).  Patients are able to view lab/test results,  encounter notes, upcoming appointments, etc.  Non-urgent messages can be sent to your provider as well.   To learn more about what you can do with MyChart, go to [email or web address].   Other Instructions       Cardiac/Peripheral Catheterization   You are scheduled for a Cardiac Catheterization on Wednesday, February 4 with Dr. Newman Lawrence.  1. Please arrive at the [facility] (Main Entrance A) at [facility]: [address] [place], [place] [address] at 8:00 AM (This time is 2 hour(s) before your procedure to ensure your preparation).   Free valet parking service is available. You will check in at ADMITTING. The support person will be asked to wait in the waiting room.  It is OK to have someone drop you off and come back when you are ready to be discharged.        Special note: Every effort is made to have your procedure done on time. Please understand that emergencies sometimes delay scheduled procedures.  2. Diet: Nothing to eat after midnight.  3. Hydration:You need to be well hydrated before your procedure. On February  4, you may drink approved liquids (see below) until 2 hours before the procedure, with 16 oz of water as your last intake.   List of approved liquids water, clear juice, clear tea, black coffee, fruit juices, non-citric and without pulp, carbonated beverages, Gatorade, Kool -Aid, plain Jello-O and plain ice popsicles.  4. Labs: You will need to have blood drawn on Wednesday, January  21 at [facility] (1st Floor), [address], [place], [place] [address]. You do not need to be fasting.  5. Medication instructions in preparation for your procedure:   Contrast Allergy: No  On the morning of your procedure, take Aspirin  81 mg and any morning medicines.  You may use sips of water.  6. Plan to go home the same day, you will only stay overnight if medically necessary. 7. You MUST have a responsible  adult to drive you home. 8. An adult MUST be with you the first 24 hours after you arrive home. 9. Bring a current list of your medications, and the last time and date medication taken. 10. Bring ID and current insurance cards. 11.Please wear clothes that are easy to get on and off and wear slip-on shoes.  Thank you for allowing us  to care for you!   -- [facility]

## 2024-07-15 ENCOUNTER — Ambulatory Visit: Admitting: Cardiology

## 2024-07-15 ENCOUNTER — Telehealth: Payer: Self-pay | Admitting: Internal Medicine

## 2024-07-15 NOTE — Telephone Encounter (Signed)
 Another referral was sent for this patient. Pt is requesting to see a different provider in our office.

## 2024-07-16 NOTE — Telephone Encounter (Signed)
 LMOM for patient to return call.

## 2024-07-27 ENCOUNTER — Telehealth: Payer: Self-pay | Admitting: Cardiology

## 2024-07-27 ENCOUNTER — Telehealth: Payer: Self-pay | Admitting: *Deleted

## 2024-07-27 ENCOUNTER — Other Ambulatory Visit: Payer: Self-pay | Admitting: *Deleted

## 2024-07-27 DIAGNOSIS — Z01812 Encounter for preprocedural laboratory examination: Secondary | ICD-10-CM

## 2024-07-27 DIAGNOSIS — R931 Abnormal findings on diagnostic imaging of heart and coronary circulation: Secondary | ICD-10-CM

## 2024-07-27 DIAGNOSIS — I25112 Atherosclerotic heart disease of native coronary artery with refractory angina pectoris: Secondary | ICD-10-CM

## 2024-07-27 NOTE — Addendum Note (Signed)
 Addended by: Karra Pink M on: 07/27/2024 10:08 AM   Modules accepted: Orders

## 2024-07-27 NOTE — Telephone Encounter (Signed)
 Spoke to patient she stated someone already rescheduled cath.Cath rescheduled 2/11 at 10:00 am.

## 2024-07-27 NOTE — Telephone Encounter (Signed)
 Patient tells me she needs to reschedule cath due to poor road conditions in her area. Patient requests to reschedule to Wednesday February 11-Dr Patwardhan not in cath lab that day rescheduled with Dr Ladona Reviewed instructions with patient and will send instructions via MyChart.   You are scheduled for a Cardiac Catheterization on Wednesday, February 11 with Dr. Gordy Ladona.  - Please arrive at the [facility] (Main Entrance A) at [facility]: [address] [place], [place] [address] at 8:00 AM. This time is 2 hour(s) before your procedure time at 10 AM to ensure your preparation.  Free valet parking service is available. You will check in at ADMITTING. The support person will be asked to wait in the waiting room.  It is OK to have someone drop you off and come back when you are ready to be discharged.    Special note: Every effort is made to have your procedure done on time. Please understand that emergencies sometimes delay scheduled procedures.  - Diet: Nothing to eat after midnight.   - Hydration: You need to be well hydrated before your procedure. You may drink approved liquids (see below) until 2 hours before the procedure. Please drink 16 oz of water as your last intake.   List of approved liquids water, clear juice, clear tea, black coffee, fruit juices, non-citric and without pulp, carbonated beverages, Gatorade, Kool -Aid, plain Jello-O and plain ice popsicles.  -Labs: You will need to have blood drawn on Monday, February 9 at [facility] (1st Floor), [address] [place], [place] [address]. Lab hours 8 AM-4:30 PM. You do not need to be fasting.  -Medication instructions: On the morning of your procedure, take aspirin  81 mg and any of your usual morning medications.  - Plan to go home the same day, you will only stay overnight if medically necessary. -Bring a current list of your medications and current insurance  cards. -You MUST have a responsible person to drive you home. -Someone MUST be with you the first 24 hours after you arrive home or your discharge will be delayed. - Please wear clothes that are easy to get on and off and wear slip-on shoes.  Thank you for allowing us  to care for you!   -- [place] [facility]

## 2024-07-27 NOTE — Telephone Encounter (Deleted)
 Called patient to discuss rescheduling cath, no answer, voicemail message.

## 2024-07-28 NOTE — Telephone Encounter (Signed)
 Covering Dr. Tyree inbox. Aware of the rescheduled cath. Please make sure patient get her pre-cath blood work that was ordered.

## 2024-08-04 ENCOUNTER — Ambulatory Visit (HOSPITAL_COMMUNITY): Admission: RE | Admit: 2024-08-04 | Source: Home / Self Care | Admitting: Cardiology

## 2024-08-04 ENCOUNTER — Encounter (HOSPITAL_COMMUNITY): Admission: RE | Payer: Self-pay | Source: Home / Self Care

## 2024-08-12 ENCOUNTER — Ambulatory Visit: Admitting: Cardiology
